# Patient Record
Sex: Female | Born: 1990 | Race: Black or African American | Hispanic: No | Marital: Single | Smoking: Never smoker
Health system: Southern US, Community
[De-identification: ages and names within clinical notes are randomized; demographics above are authoritative.]

## PROBLEM LIST (undated history)

## (undated) ENCOUNTER — Inpatient Hospital Stay (HOSPITAL_COMMUNITY): Payer: Self-pay

## (undated) DIAGNOSIS — K219 Gastro-esophageal reflux disease without esophagitis: Secondary | ICD-10-CM

## (undated) DIAGNOSIS — A749 Chlamydial infection, unspecified: Secondary | ICD-10-CM

## (undated) DIAGNOSIS — Z8619 Personal history of other infectious and parasitic diseases: Secondary | ICD-10-CM

## (undated) DIAGNOSIS — Z8659 Personal history of other mental and behavioral disorders: Secondary | ICD-10-CM

## (undated) DIAGNOSIS — F410 Panic disorder [episodic paroxysmal anxiety] without agoraphobia: Secondary | ICD-10-CM

## (undated) DIAGNOSIS — A549 Gonococcal infection, unspecified: Secondary | ICD-10-CM

## (undated) DIAGNOSIS — K8020 Calculus of gallbladder without cholecystitis without obstruction: Secondary | ICD-10-CM

## (undated) HISTORY — PX: CHOLECYSTECTOMY: SHX55

## (undated) HISTORY — PX: THERAPEUTIC ABORTION: SHX798

---
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
## 2011-02-05 ENCOUNTER — Emergency Department (HOSPITAL_COMMUNITY)
Admission: EM | Admit: 2011-02-05 | Discharge: 2011-02-05 | Disposition: A | Discharge status: Home / Self Care | Payer: Medicaid Other | Attending: Emergency Medicine | Admitting: Emergency Medicine

## 2011-02-05 DIAGNOSIS — R109 Unspecified abdominal pain: Secondary | ICD-10-CM | POA: Insufficient documentation

## 2011-02-05 DIAGNOSIS — N390 Urinary tract infection, site not specified: Secondary | ICD-10-CM | POA: Insufficient documentation

## 2011-02-05 DIAGNOSIS — R300 Dysuria: Secondary | ICD-10-CM | POA: Insufficient documentation

## 2011-02-05 LAB — URINALYSIS, ROUTINE W REFLEX MICROSCOPIC
Glucose, UA: NEGATIVE mg/dL
pH: 6 (ref 5.0–8.0)

## 2011-02-05 LAB — URINE MICROSCOPIC-ADD ON

## 2011-02-07 LAB — URINE CULTURE
Colony Count: 55000
Culture  Setup Time: 201209181354

## 2011-12-16 ENCOUNTER — Emergency Department (HOSPITAL_COMMUNITY)
Admission: EM | Admit: 2011-12-16 | Discharge: 2011-12-16 | Disposition: A | Discharge status: Home / Self Care | Payer: Medicaid Other | Attending: Emergency Medicine | Admitting: Emergency Medicine

## 2011-12-16 ENCOUNTER — Emergency Department (HOSPITAL_COMMUNITY): Payer: Medicaid Other

## 2011-12-16 ENCOUNTER — Encounter (HOSPITAL_COMMUNITY): Payer: Self-pay | Admitting: Emergency Medicine

## 2011-12-16 DIAGNOSIS — R197 Diarrhea, unspecified: Secondary | ICD-10-CM | POA: Insufficient documentation

## 2011-12-16 DIAGNOSIS — R109 Unspecified abdominal pain: Secondary | ICD-10-CM | POA: Insufficient documentation

## 2011-12-16 LAB — WET PREP, GENITAL
Clue Cells Wet Prep HPF POC: NONE SEEN
Yeast Wet Prep HPF POC: NONE SEEN

## 2011-12-16 LAB — URINALYSIS, ROUTINE W REFLEX MICROSCOPIC
Glucose, UA: NEGATIVE mg/dL
Leukocytes, UA: NEGATIVE
Nitrite: NEGATIVE
pH: 6.5 (ref 5.0–8.0)

## 2011-12-16 LAB — CBC WITH DIFFERENTIAL/PLATELET
Basophils Relative: 1 % (ref 0–1)
Eosinophils Relative: 1 % (ref 0–5)
HCT: 37.1 % (ref 36.0–46.0)
Hemoglobin: 12.6 g/dL (ref 12.0–15.0)
MCV: 84.7 fL (ref 78.0–100.0)
RBC: 4.38 MIL/uL (ref 3.87–5.11)
WBC: 5.4 10*3/uL (ref 4.0–10.5)

## 2011-12-16 LAB — COMPREHENSIVE METABOLIC PANEL
AST: 15 U/L (ref 0–37)
Alkaline Phosphatase: 63 U/L (ref 39–117)
BUN: 8 mg/dL (ref 6–23)
Calcium: 9.4 mg/dL (ref 8.4–10.5)
Chloride: 104 mEq/L (ref 96–112)
GFR calc non Af Amer: >90 mL/min (ref >90)
Glucose, Bld: 86 mg/dL (ref 70–99)
Total Protein: 8 g/dL (ref 6.0–8.3)

## 2011-12-16 LAB — POCT PREGNANCY, URINE: Preg Test, Ur: NEGATIVE

## 2011-12-16 LAB — URINE MICROSCOPIC-ADD ON

## 2011-12-16 MED ORDER — FAMOTIDINE 20 MG PO TABS: 20.0000 mg | ORAL_TABLET | Freq: Two times a day (BID) | ORAL | Status: DC

## 2011-12-16 NOTE — ED Notes (Signed)
Sharp abd pain  That radiates from lower abd to chest started 1 week ago denies n/v/d states pain makes it hard to breath sometimes. Pt states is on her period now denies dysuria or vag d/c

## 2011-12-16 NOTE — ED Provider Notes (Signed)
History     CSN: [id number]  Arrival date & time 12/16/11  4098   First MD Initiated Contact with Patient 12/16/11 [phone]      Chief Complaint  Patient presents with  . Abdominal Pain    (Consider location/radiation/quality/duration/timing/severity/associated sxs/prior treatment) HPI  21 y/o female c/o lower abdominal pain that radiates to sternum episodes last an hour have been going on x7 days. Denies N/V, urinary symptoms, fever. Pain only comes between 2-4 AM. She is in no pain now, pain not timed with PO intake.  Pain worsened significantly last night to8/10. Pt has not taken any pain Rx. Associated with SOB. +Diarrhea.   PCP Tamela Oddi  LMP: 12/13/2011 actively menstruating  History reviewed. No pertinent past medical history.  History reviewed. No pertinent past surgical history.  No family history on file.  History  Substance Use Topics  . Smoking status: Not on file  . Smokeless tobacco: Not on file  . Alcohol Use: Not on file    OB History    Grav Para Term Preterm Abortions TAB SAB Ect Mult Living                  Review of Systems  Constitutional: Negative for fever and unexpected weight change.  Eyes: Negative for visual disturbance.  Respiratory: Negative for cough and chest tightness.   Cardiovascular: Negative for chest pain, palpitations and leg swelling.  Gastrointestinal: Positive for diarrhea. Negative for nausea and abdominal pain.  Musculoskeletal: Negative for back pain.  Skin: Negative for rash.  Neurological: Negative for weakness, numbness and headaches.  Psychiatric/Behavioral: Negative for agitation.  All other systems reviewed and are negative.    Allergies  Review of patient's allergies indicates no known allergies.  Home Medications  No current outpatient prescriptions on file.  BP 126/87  Pulse 68  Temp 97.5 F (36.4 C) (Oral)  Resp 16  SpO2 100%  Physical Exam  Vitals reviewed. Constitutional: She is oriented to  person, place, and time. She appears well-developed and well-nourished. No distress.  HENT:  Head: Normocephalic.  Eyes: Conjunctivae and EOM are normal. Pupils are equal, round, and reactive to light.  Neck: Normal range of motion.  Cardiovascular: Normal rate, regular rhythm and normal heart sounds.   Pulmonary/Chest: Effort normal.  Abdominal: Soft. Bowel sounds are normal. She exhibits no distension and no mass. There is tenderness. There is no rebound and no guarding.       Tenderness to right upper quadrant right lower quadrant and suprapubic area. No rebound no brought guarding Rovsing negative, psoas negative, obturator positive  Genitourinary: Uterus normal. Pelvic exam was performed with patient prone. There is no rash, tenderness, lesion or injury on the right labia. There is no rash, tenderness, lesion or injury on the left labia. Uterus is not tender. Cervix exhibits discharge. Cervix exhibits no motion tenderness and no friability. Right adnexum displays no mass, no tenderness and no fullness. Left adnexum displays no mass, no tenderness and no fullness. There is bleeding around the vagina. No erythema or tenderness around the vagina. No foreign body around the vagina. No signs of injury around the vagina. No vaginal discharge found.       No cervical motion tenderness, scant dark blood from os and in the posterior fornix  Musculoskeletal: Normal range of motion.  Neurological: She is alert and oriented to person, place, and time.  Skin: Skin is warm and dry.  Psychiatric: She has a normal mood and affect.  ED Course  Procedures (including critical care time)  Labs Reviewed  LIPASE, BLOOD - Abnormal; Notable for the following:    Lipase 67 (*)     All other components within normal limits  URINALYSIS, ROUTINE W REFLEX MICROSCOPIC - Abnormal; Notable for the following:    Hgb urine dipstick LARGE (*)     All other components within normal limits  WET PREP, GENITAL -  Abnormal; Notable for the following:    WBC, Wet Prep HPF POC MODERATE (*)     All other components within normal limits  CBC WITH DIFFERENTIAL  COMPREHENSIVE METABOLIC PANEL  POCT PREGNANCY, URINE  URINE MICROSCOPIC-ADD ON  GC/CHLAMYDIA PROBE AMP, GENITAL   Dg Chest 2 View  12/16/2011  *RADIOLOGY REPORT*  Clinical Data: Chest pain.  Abdominal pain.  Shortness of breath.  CHEST - 2 VIEW  Comparison: None.  Findings: Heart size is normal.  Mediastinal shadows are normal. Lungs are clear.  No effusions.  No bony abnormalities.  IMPRESSION: Normal chest  Original Report Authenticated By: Thomasenia Sales, M.D.     1. Abdominal pain       MDM  21 year old female complaining of abdominal pain originating and suprapubic area radiating to the sternum was accompanied shortness of breath for one week. Pain occurs only at night between the hours of 2 and 5 AM. Pain occurs every night.  Chest x-ray and blood work are normal UA shows because she is actively menstruating at this time EKG shows nonspecific T wave inversions. Patient is low risk by Wells criteria and perked negative.   Date: 12/16/2011  Rate: 72  Rhythm: normal sinus rhythm  QRS Axis: normal  Intervals: normal  ST/T Wave abnormalities: nonspecific T wave changes  Conduction Disutrbances:none  Narrative Interpretation: Flipped T waves in III, AVF, and anterior leads  Old EKG Reviewed: none available  No acute cause of pain likely based on history, physical exam and labs. Because the pain comes at night GERD is most likely cause. I will treat with Pepcid and encourage primary care followup. Discussed case with attending who agrees with plan and stability to d/c to home.  Pt verbalized understanding and agrees with care plan. Outpatient follow-up and return precautions given.     Wynetta Emery, PA-C 12/16/11 1119

## 2011-12-16 NOTE — ED Provider Notes (Signed)
Medical screening examination/treatment/procedure(s) were performed by non-physician practitioner and as supervising physician I was immediately available for consultation/collaboration.   Sevastian Witczak, MD 12/16/11 1644 

## 2012-01-16 ENCOUNTER — Encounter (HOSPITAL_COMMUNITY): Payer: Self-pay | Admitting: Emergency Medicine

## 2012-01-16 ENCOUNTER — Emergency Department (HOSPITAL_COMMUNITY)
Admission: EM | Admit: 2012-01-16 | Discharge: 2012-01-16 | Disposition: A | Discharge status: Home / Self Care | Payer: Medicaid Other | Attending: Emergency Medicine | Admitting: Emergency Medicine

## 2012-01-16 ENCOUNTER — Emergency Department (HOSPITAL_COMMUNITY)
Admission: EM | Admit: 2012-01-16 | Discharge: 2012-01-16 | Disposition: A | Discharge status: Home / Self Care | Payer: Medicaid Other | Source: Home / Self Care | Attending: Emergency Medicine | Admitting: Emergency Medicine

## 2012-01-16 DIAGNOSIS — Z113 Encounter for screening for infections with a predominantly sexual mode of transmission: Secondary | ICD-10-CM | POA: Insufficient documentation

## 2012-01-16 DIAGNOSIS — B9689 Other specified bacterial agents as the cause of diseases classified elsewhere: Secondary | ICD-10-CM | POA: Insufficient documentation

## 2012-01-16 DIAGNOSIS — N760 Acute vaginitis: Secondary | ICD-10-CM

## 2012-01-16 DIAGNOSIS — A499 Bacterial infection, unspecified: Secondary | ICD-10-CM | POA: Insufficient documentation

## 2012-01-16 LAB — URINALYSIS, ROUTINE W REFLEX MICROSCOPIC
Bilirubin Urine: NEGATIVE
Glucose, UA: NEGATIVE mg/dL
Glucose, UA: NEGATIVE mg/dL
Hgb urine dipstick: NEGATIVE
Nitrite: NEGATIVE
Specific Gravity, Urine: 1.03 (ref 1.005–1.030)
pH: 7 (ref 5.0–8.0)
pH: 6 (ref 5.0–8.0)

## 2012-01-16 LAB — WET PREP, GENITAL: Yeast Wet Prep HPF POC: NONE SEEN

## 2012-01-16 LAB — URINE MICROSCOPIC-ADD ON

## 2012-01-16 MED ORDER — METRONIDAZOLE 500 MG PO TABS: 500.0000 mg | ORAL_TABLET | Freq: Three times a day (TID) | ORAL | Status: AC

## 2012-01-16 NOTE — ED Notes (Signed)
Vag d/c and hurts to void x 3 days

## 2012-01-16 NOTE — ED Provider Notes (Cosign Needed)
History  Scribed for Ward Givens, MD, the patient was seen in room TR04C/TR04C. This chart was scribed by Candelaria Stagers. The patient's care started at 7:00 PM   CSN: [id number]  Arrival date & time 01/16/12  1303   First MD Initiated Contact with Patient 01/16/12 1648      Chief Complaint  Patient presents with  . Exposure to STD     The history is provided by the patient.   Annette Wood is a 21 y.o. female who presents to the Emergency Department complaining of vaginal discharge and pain that started three days ago.  Pt has the implanon in place and states that she has irregular periods since she has the implanon with her last period being  three weeks ago.  She is also experiencing dysuria and increased frequency.  Pt denies vaginal bleeding, abdominal pain or hematuria.   Pt has h/o chlamydia and states that the present sx are different.  She denies new sexual partners.  Grava 2 Para 2 AB0, LNP the first of the month. G[facility]  History reviewed. No pertinent past medical history.  History reviewed. No pertinent past surgical history.  No family history on file.  History  Substance Use Topics  . Smoking status: Not on file  . Smokeless tobacco: Not on file  . Alcohol Use: Not on file  unemployed  OB History    Grav Para Term Preterm Abortions TAB SAB Ect Mult Living                  Review of Systems  Gastrointestinal: Negative for abdominal pain.  Genitourinary: Positive for dysuria and frequency. Negative for hematuria.  All other systems reviewed and are negative.    Allergies  Review of patient's allergies indicates no known allergies.  Home Medications   Current Outpatient Rx  Name Route Sig Dispense Refill  . FAMOTIDINE 20 MG PO TABS Oral Take 1 tablet (20 mg total) by mouth 2 (two) times daily. 30 tablet 0  Implanon  BP 119/86  Pulse 85  Temp 98.4 F (36.9 C) (Oral)  Resp 20  SpO2 98%  Vital signs normal    Physical Exam    Nursing note and vitals reviewed. Constitutional: She is oriented to person, place, and time. She appears well-developed and well-nourished. No distress.  HENT:  Head: Normocephalic and atraumatic.  Right Ear: External ear normal.  Left Ear: External ear normal.  Nose: Nose normal.  Mouth/Throat: Oropharynx is clear and moist.  Eyes: Conjunctivae and EOM are normal. Pupils are equal, round, and reactive to light.  Neck: Normal range of motion.  Cardiovascular: Normal rate, regular rhythm and normal heart sounds.   Pulmonary/Chest: Effort normal. No respiratory distress. She has no wheezes. She has no rales.  Abdominal: Soft. Bowel sounds are normal. She exhibits no distension. There is no tenderness. There is no rebound and no guarding.  Genitourinary:       A lot of thin white discharge.  Normal external genitalia.  Uterus normal size and non tender.  Adnexa normal not enlarged   Musculoskeletal: Normal range of motion.  Neurological: She is alert and oriented to person, place, and time.  Skin: Skin is warm and dry. She is not diaphoretic.  Psychiatric: She has a normal mood and affect. Her behavior is normal.    ED Course  Procedures   DIAGNOSTIC STUDIES: Oxygen Saturation is 98% on room air, normal by my interpretation.    COORDINATION OF CARE:  13:25 Pregnancy, urine POC; Urinalysis, Routine w reflex microscopic; Urine microscopic-add on   17:09 Ordered: GC/chlamydia probe amp, genital; RPR; Urinalysis, Routine w reflex microscopic; Wet prep, genital  Results for orders placed during the hospital encounter of 01/16/12  URINALYSIS, ROUTINE W REFLEX MICROSCOPIC      Component Value Range   Color, Urine YELLOW  YELLOW   APPearance CLEAR  CLEAR   Specific Gravity, Urine 1.030  1.005 - 1.030   pH 7.0  5.0 - 8.0   Glucose, UA NEGATIVE  NEGATIVE mg/dL   Hgb urine dipstick NEGATIVE  NEGATIVE   Bilirubin Urine NEGATIVE  NEGATIVE   Ketones, ur NEGATIVE  NEGATIVE mg/dL   Protein,  ur NEGATIVE  NEGATIVE mg/dL   Urobilinogen, UA 1.0  0.0 - 1.0 mg/dL   Nitrite NEGATIVE  NEGATIVE   Leukocytes, UA MODERATE (*) NEGATIVE  POCT PREGNANCY, URINE      Component Value Range   Preg Test, Ur NEGATIVE  NEGATIVE  URINE MICROSCOPIC-ADD ON      Component Value Range   Squamous Epithelial / LPF RARE  RARE   WBC, UA 11-20  <3 WBC/hpf   RBC / HPF 0-2  <3 RBC/hpf   Bacteria, UA RARE  RARE   Urine-Other MUCOUS PRESENT    URINALYSIS, ROUTINE W REFLEX MICROSCOPIC      Component Value Range   Color, Urine YELLOW  YELLOW   APPearance CLEAR  CLEAR   Specific Gravity, Urine 1.030  1.005 - 1.030   pH 6.0  5.0 - 8.0   Glucose, UA NEGATIVE  NEGATIVE mg/dL   Hgb urine dipstick NEGATIVE  NEGATIVE   Bilirubin Urine NEGATIVE  NEGATIVE   Ketones, ur NEGATIVE  NEGATIVE mg/dL   Protein, ur NEGATIVE  NEGATIVE mg/dL   Urobilinogen, UA 1.0  0.0 - 1.0 mg/dL   Nitrite NEGATIVE  NEGATIVE   Leukocytes, UA SMALL (*) NEGATIVE  WET PREP, GENITAL      Component Value Range   Yeast Wet Prep HPF POC NONE SEEN  NONE SEEN   Trich, Wet Prep NONE SEEN  NONE SEEN   Clue Cells Wet Prep HPF POC MODERATE (*) NONE SEEN   WBC, Wet Prep HPF POC MANY (*) NONE SEEN  POCT PREGNANCY, URINE      Component Value Range   Preg Test, Ur NEGATIVE  NEGATIVE  URINE MICROSCOPIC-ADD ON      Component Value Range   Squamous Epithelial / LPF RARE  RARE   WBC, UA 3-6  <3 WBC/hpf   Urine-Other MUCOUS PRESENT     Laboratory interpretation all normal except BV    1. BV (bacterial vaginosis)     Patient's Medications  New Prescriptions   METRONIDAZOLE (FLAGYL) 500 MG TABLET    Take 1 tablet (500 mg total) by mouth 3 (three) times daily.    Plan discharge  Devoria Albe, MD, FACEP   MDM   I personally performed the services described in this documentation, which was scribed in my presence. The recorded information has been reviewed and considered.  Devoria Albe, MD, Armando Gang        Ward Givens, MD 01/16/12 1901

## 2012-01-18 NOTE — ED Notes (Signed)
+  Gonorrhea. Chart sent to EDP office for review. [facility] attached. °

## 2012-01-19 NOTE — ED Notes (Addendum)
Chart returned from EDP office. Prescribed azithromycin 1 gram PO x 1 and cefixime 400 mg PO. Prescribed by Johnnette Gourd PA-C. Called patient and informed them of +result and new Rx. Wants Rx called to [facility] on [address].

## 2012-01-19 NOTE — ED Notes (Signed)
Rx called to [facility] on [address] by Norm Parcel PFM.

## 2012-02-08 ENCOUNTER — Emergency Department (HOSPITAL_COMMUNITY)
Admission: EM | Admit: 2012-02-08 | Discharge: 2012-02-08 | Disposition: A | Discharge status: Home / Self Care | Payer: Medicaid Other | Attending: Emergency Medicine | Admitting: Emergency Medicine

## 2012-02-08 ENCOUNTER — Encounter (HOSPITAL_COMMUNITY): Payer: Self-pay | Admitting: Family Medicine

## 2012-02-08 DIAGNOSIS — K219 Gastro-esophageal reflux disease without esophagitis: Secondary | ICD-10-CM | POA: Insufficient documentation

## 2012-02-08 HISTORY — DX: Gastro-esophageal reflux disease without esophagitis: K21.9

## 2012-02-08 MED ORDER — HYDROCODONE-ACETAMINOPHEN 5-500 MG PO TABS: 1.0000 | ORAL_TABLET | Freq: Four times a day (QID) | ORAL | Status: DC | PRN

## 2012-02-08 MED ORDER — FAMOTIDINE 20 MG PO TABS: 20.0000 mg | ORAL_TABLET | Freq: Two times a day (BID) | ORAL | Status: DC

## 2012-02-08 MED ORDER — GI COCKTAIL ~~LOC~~
30.0000 mL | Freq: Once | ORAL | Status: AC
  Administered 2012-02-08: 30 mL via ORAL
  Filled 2012-02-08: qty 30

## 2012-02-08 NOTE — ED Notes (Signed)
Patient states that she has reflux. Today ate baked pork chops and rice and started having epigastric pain. Took Famotidine 20 mg (3 tabs, 1 @ 1900, 1 @ 2200, 1 @ 0100) without relief. Denies nausea, vomiting, diarrhea.

## 2012-02-08 NOTE — ED Notes (Signed)
Pt c/o epigastric burning radiating up into chest onset last even after eating. Pt denies n/v/d. PWD.

## 2012-02-08 NOTE — ED Provider Notes (Signed)
History     CSN: [id number]  Arrival date & time 02/08/12  1478   First MD Initiated Contact with Patient 02/08/12 0413      Chief Complaint  Patient presents with  . Abdominal Pain    (Consider location/radiation/quality/duration/timing/severity/associated sxs/prior treatment) HPI  Patient presents to the ER with complaints of reflux that will not get better with his pepcid. She had fried pork chops tonight with rice. She admits that she knows she should not have been met tonight as it aggravates her gastric reflux. She says that by the time she was being brought back to the exam room for acid reflux had gone away and she was no longer having any discomfort. She denies having any nausea, vomiting, diarrhea, chest pain, shortness of breath, wheezing, coughing or recent illness. She has not been to a specialist for this visit she has been dealing with this since her 21-year-old was born. VSS/NAD  Past Medical History  Diagnosis Date  . Gastroesophageal reflux     History reviewed. No pertinent past surgical history.  No family history on file.  History  Substance Use Topics  . Smoking status: Never Smoker   . Smokeless tobacco: Not on file  . Alcohol Use: No    OB History    Grav Para Term Preterm Abortions TAB SAB Ect Mult Living                  Review of Systems  Review of Systems  Gen: no weight loss, fevers, chills, night sweats  Eyes: no discharge or drainage, no occular pain or visual changes  Nose: no epistaxis or rhinorrhea  Mouth: no dental pain, no sore throat  Neck: no neck pain  Lungs:No wheezing, coughing or hemoptysis CV: no chest pain, palpitations, dependent edema or orthopnea  Abd: + epigastric pain and burping, nausea, vomiting  GU: no dysuria or gross hematuria  MSK:  No abnormalities  Neuro: no headache, no focal neurologic deficits  Skin: no abnormalities Psyche: negative.   Allergies  Review of patient's allergies indicates no known  allergies.  Home Medications   Current Outpatient Rx  Name Route Sig Dispense Refill  . ETONOGESTREL 68 MG [place] IMPL Subcutaneous Inject 1 each into the skin once.    Marland Kitchen FAMOTIDINE 20 MG PO TABS Oral Take 1 tablet (20 mg total) by mouth 2 (two) times daily. 30 tablet 0  . HYDROCODONE-ACETAMINOPHEN 5-500 MG PO TABS Oral Take 1-2 tablets by mouth every 6 (six) hours as needed for pain. 15 tablet 0    BP 130/91  Pulse 67  Temp 97.8 F (36.6 C) (Oral)  Resp 22  Wt 186 lb 4 oz (84.482 kg)  SpO2 100%  LMP 01/23/2012  Physical Exam  Nursing note and vitals reviewed. Constitutional: She appears well-developed and well-nourished. No distress.  HENT:  Head: Normocephalic and atraumatic.  Eyes: Pupils are equal, round, and reactive to light.  Neck: Normal range of motion. Neck supple.  Cardiovascular: Normal rate and regular rhythm.   Pulmonary/Chest: Effort normal.  Abdominal: Soft.  Neurological: She is alert.  Skin: Skin is warm and dry.    ED Course  Procedures (including critical care time)  Labs Reviewed - No data to display No results found.   1. GERD (gastroesophageal reflux disease)       MDM  Pt admits that by the time they started bringing her back to an exam room she was no longer having pain. She was given GI cocktail  and it also helped make her " feel better". Pt out of pepcid. This was refilled along with some pain medication and a referral to GI.  Pt has been advised of the symptoms that warrant their return to the ED. Patient has voiced understanding and has agreed to follow-up with the PCP or specialist.         Dorthula Matas, PA 02/08/12 [phone]

## 2012-02-09 NOTE — ED Provider Notes (Signed)
Medical screening examination/treatment/procedure(s) were performed by non-physician practitioner and as supervising physician I was immediately available for consultation/collaboration.  Sunnie Nielsen, MD 02/09/12 [phone]

## 2012-03-03 ENCOUNTER — Emergency Department (HOSPITAL_COMMUNITY)
Admission: EM | Admit: 2012-03-03 | Discharge: 2012-03-03 | Disposition: A | Discharge status: Home / Self Care | Payer: Medicaid Other | Attending: Emergency Medicine | Admitting: Emergency Medicine

## 2012-03-03 ENCOUNTER — Encounter (HOSPITAL_COMMUNITY): Payer: Self-pay

## 2012-03-03 DIAGNOSIS — T148 Other injury of unspecified body region: Secondary | ICD-10-CM | POA: Insufficient documentation

## 2012-03-03 DIAGNOSIS — W57XXXA Bitten or stung by nonvenomous insect and other nonvenomous arthropods, initial encounter: Secondary | ICD-10-CM | POA: Insufficient documentation

## 2012-03-03 DIAGNOSIS — K219 Gastro-esophageal reflux disease without esophagitis: Secondary | ICD-10-CM | POA: Insufficient documentation

## 2012-03-03 MED ORDER — PERMETHRIN 5 % EX CREA: TOPICAL_CREAM | CUTANEOUS | Status: DC

## 2012-03-03 NOTE — ED Notes (Signed)
[id number]<AV> Expected date:<BR> Expected time:<BR> Means of arrival:<BR> Comments:<BR> Hold for cleaning

## 2012-03-03 NOTE — ED Notes (Signed)
Pt presents with NAD- known exposure to bed bugs generalized x 2 days

## 2012-03-03 NOTE — ED Notes (Signed)
MD at bedside. 

## 2012-03-03 NOTE — ED Provider Notes (Signed)
History     CSN: [id number]  Arrival date & time 03/03/12  1032   First MD Initiated Contact with Patient 03/03/12 1106      Chief Complaint  Patient presents with  . Rash    bed bugs    (Consider location/radiation/quality/duration/timing/severity/associated sxs/prior treatment) HPI  Pt came in to ER for a rash. She got a new mattress from family member and saw small red bugs crawling around on it two days ago. Now she has a rash to legs, arms and some bite marks on the forehead that are very itchy. She got rid of the mattress already and her mother has washed all of her clothes and sheets that were in the room. No fevers, chills, nausea, vomiting, diarrhea or other systemic symptoms.  Past Medical History  Diagnosis Date  . Gastroesophageal reflux     History reviewed. No pertinent past surgical history.  No family history on file.  History  Substance Use Topics  . Smoking status: Never Smoker   . Smokeless tobacco: Not on file  . Alcohol Use: No    OB History    Grav Para Term Preterm Abortions TAB SAB Ect Mult Living                  Review of Systems   Review of Systems  Gen: no weight loss, fevers, chills, night sweats  Eyes: no discharge or drainage, no occular pain or visual changes  Nose: no epistaxis or rhinorrhea  Mouth: no dental pain, no sore throat  Neck: no neck pain  Lungs:No wheezing, coughing or hemoptysis CV: no chest pain, palpitations, dependent edema or orthopnea  Abd: no abdominal pain, nausea, vomiting  GU: no dysuria or gross hematuria  MSK:  No abnormalities  Neuro: no headache, no focal neurologic deficits  Skin: bug bites Psyche: negative.    Allergies  Review of patient's allergies indicates no known allergies.  Home Medications   Current Outpatient Rx  Name Route Sig Dispense Refill  . FAMOTIDINE 20 MG PO TABS Oral Take 1 tablet (20 mg total) by mouth 2 (two) times daily. 30 tablet 0  . ETONOGESTREL 68 MG [place] IMPL  Subcutaneous Inject 1 each into the skin once.    Marland Kitchen PERMETHRIN 5 % EX CREA  Apply to affected area once 60 g 0    BP 116/81  Pulse 81  Temp 98.5 F (36.9 C) (Oral)  Resp 18  Ht 5\' 3"  (1.6 m)  Wt 182 lb (82.555 kg)  BMI 32.24 kg/m2  SpO2 100%  LMP 01/23/2012  Physical Exam  Nursing note and vitals reviewed. Constitutional: She appears well-developed and well-nourished. No distress.  HENT:  Head: Normocephalic and atraumatic.  Eyes: Pupils are equal, round, and reactive to light.  Neck: Normal range of motion. Neck supple.  Cardiovascular: Normal rate and regular rhythm.   Pulmonary/Chest: Effort normal.  Abdominal: Soft.  Neurological: She is alert.  Skin: Skin is warm and dry. Rash noted.       But bites to arms and legs    ED Course  Procedures (including critical care time)  Labs Reviewed - No data to display No results found.   1. Bed bug bite       MDM  Consistent with Bed bugs. Pt education given. Referral to Derm and Elimite cream Rx.  Pt has been advised of the symptoms that warrant their return to the ED. Patient has voiced understanding and has agreed to follow-up with the  PCP or specialist.         Dorthula Matas, PA 03/03/12 [phone]

## 2012-03-03 NOTE — ED Provider Notes (Signed)
Medical screening examination/treatment/procedure(s) were performed by non-physician practitioner and as supervising physician I was immediately available for consultation/collaboration.   Charles B. Sheldon, MD 03/03/12 1511 

## 2012-05-20 NOTE — L&D Delivery Note (Signed)
Delivery Note At 3:25 PM a viable female was delivered via Vaginal, Spontaneous Delivery (Presentation: ; Occiput Anterior).  APGAR: 9, 9; weight:  3221 gms .   Placenta status: Intact, Spontaneous.  Cord: 3 vessels with the following complications: None.  Cord pH: none  Anesthesia: Epidural  Episiotomy: None Lacerations: None Suture Repair: none Est. Blood Loss (mL): 350  Mom to postpartum.  Baby to Couplet care / Skin to Skin.  Annette Wood A 04/01/2013, 3:56 PM

## 2012-08-01 ENCOUNTER — Inpatient Hospital Stay (HOSPITAL_COMMUNITY): Payer: Medicaid Other

## 2012-08-01 ENCOUNTER — Inpatient Hospital Stay (HOSPITAL_COMMUNITY)
Admission: AD | Admit: 2012-08-01 | Discharge: 2012-08-01 | Disposition: A | Discharge status: Home / Self Care | Payer: Medicaid Other | Source: Ambulatory Visit | Attending: Family Medicine | Admitting: Family Medicine

## 2012-08-01 ENCOUNTER — Encounter (HOSPITAL_COMMUNITY): Payer: Self-pay | Admitting: Family

## 2012-08-01 DIAGNOSIS — O2390 Unspecified genitourinary tract infection in pregnancy, unspecified trimester: Secondary | ICD-10-CM | POA: Insufficient documentation

## 2012-08-01 DIAGNOSIS — O210 Mild hyperemesis gravidarum: Secondary | ICD-10-CM | POA: Insufficient documentation

## 2012-08-01 DIAGNOSIS — A499 Bacterial infection, unspecified: Secondary | ICD-10-CM | POA: Insufficient documentation

## 2012-08-01 DIAGNOSIS — O219 Vomiting of pregnancy, unspecified: Secondary | ICD-10-CM

## 2012-08-01 DIAGNOSIS — N760 Acute vaginitis: Secondary | ICD-10-CM | POA: Insufficient documentation

## 2012-08-01 DIAGNOSIS — B9689 Other specified bacterial agents as the cause of diseases classified elsewhere: Secondary | ICD-10-CM | POA: Insufficient documentation

## 2012-08-01 DIAGNOSIS — O9989 Other specified diseases and conditions complicating pregnancy, childbirth and the puerperium: Secondary | ICD-10-CM

## 2012-08-01 DIAGNOSIS — R109 Unspecified abdominal pain: Secondary | ICD-10-CM | POA: Insufficient documentation

## 2012-08-01 HISTORY — DX: Gonococcal infection, unspecified: A54.9

## 2012-08-01 HISTORY — DX: Chlamydial infection, unspecified: A74.9

## 2012-08-01 HISTORY — DX: Panic disorder (episodic paroxysmal anxiety): F41.0

## 2012-08-01 LAB — CBC
HCT: 33.9 % — ABNORMAL LOW (ref 36.0–46.0)
Hemoglobin: 11.9 g/dL — ABNORMAL LOW (ref 12.0–15.0)
MCV: 85 fL (ref 78.0–100.0)
RBC: 3.99 MIL/uL (ref 3.87–5.11)
WBC: 4.8 10*3/uL (ref 4.0–10.5)

## 2012-08-01 LAB — URINALYSIS, ROUTINE W REFLEX MICROSCOPIC
Bilirubin Urine: NEGATIVE
Glucose, UA: NEGATIVE mg/dL
Hgb urine dipstick: NEGATIVE
Specific Gravity, Urine: >1.03 — ABNORMAL HIGH (ref 1.005–1.030)
pH: 6 (ref 5.0–8.0)

## 2012-08-01 LAB — WET PREP, GENITAL: Trich, Wet Prep: NONE SEEN

## 2012-08-01 LAB — HCG, QUANTITATIVE, PREGNANCY: hCG, Beta Chain, Quant, S: 2305 m[IU]/mL — ABNORMAL HIGH (ref <5)

## 2012-08-01 MED ORDER — METRONIDAZOLE 500 MG PO TABS: 500.0000 mg | ORAL_TABLET | Freq: Two times a day (BID) | ORAL | Status: DC

## 2012-08-01 MED ORDER — DEXTROSE 5 % IN LACTATED RINGERS IV BOLUS: 1000.0000 mL | Freq: Once | INTRAVENOUS | Status: DC

## 2012-08-01 MED ORDER — ONDANSETRON HCL 4 MG/2ML IJ SOLN
4.0000 mg | Freq: Once | INTRAMUSCULAR | Status: DC
  Filled 2012-08-01: qty 2

## 2012-08-01 NOTE — MAU Provider Note (Signed)
History     CSN: [id number]  Arrival date and time: 08/01/12 1217   None     Chief Complaint  Patient presents with  . Possible Pregnancy   HPI 22 y.o. G3P2002 at 4.3 weeks with low abd pain x 1 month, crampy, no bleeding. Mild nausea and vomiting, hasn't eaten today d/t lack of appetite.    Past Medical History  Diagnosis Date  . Gastroesophageal reflux   . Chlamydia   . Gonorrhea   . Panic attack     Past Surgical History  Procedure Laterality Date  . No past surgeries      History reviewed. No pertinent family history.  History  Substance Use Topics  . Smoking status: Never Smoker   . Smokeless tobacco: Never Used  . Alcohol Use: No    Allergies: No Known Allergies  No prescriptions prior to admission    Review of Systems  Constitutional: Negative.   Respiratory: Negative.   Cardiovascular: Negative.   Gastrointestinal: Positive for nausea, vomiting and abdominal pain. Negative for diarrhea and constipation.  Genitourinary: Negative for dysuria, urgency, frequency, hematuria and flank pain.       Negative for vaginal bleeding, vaginal discharge, dyspareunia  Musculoskeletal: Negative.   Neurological: Negative.   Psychiatric/Behavioral: Negative.    Physical Exam   Blood pressure 126/86, pulse 88, temperature 97.5 F (36.4 C), temperature source Oral, resp. rate 18, height 5\' 3"  (1.6 m), weight 175 lb (79.379 kg), last menstrual period 07/01/2012.  Physical Exam  Nursing note and vitals reviewed. Constitutional: She is oriented to person, place, and time. She appears well-developed and well-nourished. No distress.  Cardiovascular: Normal rate.   Respiratory: Effort normal.  GI: Soft. There is no tenderness.  Genitourinary: There is no tenderness or lesion on the right labia. There is no tenderness or lesion on the left labia. Uterus is not enlarged and not tender. Cervix exhibits no motion tenderness, no discharge and no friability. Right adnexum  displays no mass, no tenderness and no fullness. Left adnexum displays no mass, no tenderness and no fullness. No erythema or bleeding around the vagina. Vaginal discharge (foamy, white, malodorous) found.  Musculoskeletal: Normal range of motion.  Neurological: She is alert and oriented to person, place, and time.  Skin: Skin is warm and dry.  Psychiatric: She has a normal mood and affect.    MAU Course  Procedures Results for orders placed during the hospital encounter of 08/01/12 (from the past 24 hour(s))  URINALYSIS, ROUTINE W REFLEX MICROSCOPIC     Status: Abnormal   Collection Time    08/01/12 12:27 PM      Result Value Range   Color, Urine YELLOW  YELLOW   APPearance CLEAR  CLEAR   Specific Gravity, Urine >1.030 (*) 1.005 - 1.030   pH 6.0  5.0 - 8.0   Glucose, UA NEGATIVE  NEGATIVE mg/dL   Hgb urine dipstick NEGATIVE  NEGATIVE   Bilirubin Urine NEGATIVE  NEGATIVE   Ketones, ur 40 (*) NEGATIVE mg/dL   Protein, ur NEGATIVE  NEGATIVE mg/dL   Urobilinogen, UA 1.0  0.0 - 1.0 mg/dL   Nitrite NEGATIVE  NEGATIVE   Leukocytes, UA NEGATIVE  NEGATIVE  POCT PREGNANCY, URINE     Status: Abnormal   Collection Time    08/01/12 12:31 PM      Result Value Range   Preg Test, Ur POSITIVE (*) NEGATIVE  HCG, QUANTITATIVE, PREGNANCY     Status: Abnormal   Collection Time  08/01/12 12:53 PM      Result Value Range   hCG, Beta Chain, Quant, S 2305 (*) <5 mIU/mL  ABO/RH     Status: None   Collection Time    08/01/12 12:53 PM      Result Value Range   ABO/RH(D) B POS    CBC     Status: Abnormal   Collection Time    08/01/12 12:54 PM      Result Value Range   WBC 4.8  4.0 - 10.5 K/uL   RBC 3.99  3.87 - 5.11 MIL/uL   Hemoglobin 11.9 (*) 12.0 - 15.0 g/dL   HCT 52.8 (*) 41.3 - 24.4 %   MCV 85.0  78.0 - 100.0 fL   MCH 29.8  26.0 - 34.0 pg   MCHC 35.1  30.0 - 36.0 g/dL   RDW 01.0  27.2 - 53.6 %   Platelets 299  150 - 400 K/uL  WET PREP, GENITAL     Status: Abnormal   Collection Time     08/01/12  1:36 PM      Result Value Range   Yeast Wet Prep HPF POC NONE SEEN  NONE SEEN   Trich, Wet Prep NONE SEEN  NONE SEEN   Clue Cells Wet Prep HPF POC MODERATE (*) NONE SEEN   WBC, Wet Prep HPF POC FEW (*) NONE SEEN   Attempted to start IV for hydration, RN had difficulty with IV start. Pt states she can tolerate PO, just dehydrated because she hasn't had anything to eat or drink today - not having n/v now. Will PO hydrate.    U/S: Probable 5 week size gestational sac, no yolk sac or fetal pole seen, no adnexal mass  Assessment and Plan   1. Abdominal pain in pregnancy, antepartum   2. BV (bacterial vaginosis)   3. Nausea and vomiting in pregnancy prior to [redacted] weeks gestation   F/U in 48 hours for repeat quant HCG, precautions rev'd    Medication List    TAKE these medications       metroNIDAZOLE 500 MG tablet  Commonly known as:  FLAGYL  Take 1 tablet (500 mg total) by mouth 2 (two) times daily.            Follow-up Information   Follow up with THE [facility] [place] MATERNITY ADMISSIONS On 08/03/2012. (for repeat labs)    Contact information:   [address] [id number] [place] [address] [phone]       [facility] 08/01/2012, 3:45 PM

## 2012-08-01 NOTE — MAU Note (Signed)
Patient presents to MAU after positive home pregnancy test; reports LMP 2/12 and intermittent lower abdominal cramping x one month.  Denies vaginal bleeding.

## 2012-08-02 NOTE — MAU Provider Note (Signed)
Chart reviewed and agree with management and plan.  

## 2012-08-05 LAB — GC/CHLAMYDIA PROBE AMP: CT Probe RNA: NEGATIVE

## 2012-08-24 ENCOUNTER — Encounter: Payer: Self-pay | Admitting: Obstetrics & Gynecology

## 2012-08-24 ENCOUNTER — Ambulatory Visit (INDEPENDENT_AMBULATORY_CARE_PROVIDER_SITE_OTHER): Payer: Medicaid Other | Admitting: Obstetrics & Gynecology

## 2012-08-24 VITALS — BP 116/80 | Temp 98.1°F | Wt 174.0 lb

## 2012-08-24 DIAGNOSIS — Z3480 Encounter for supervision of other normal pregnancy, unspecified trimester: Secondary | ICD-10-CM

## 2012-08-24 DIAGNOSIS — Z3481 Encounter for supervision of other normal pregnancy, first trimester: Secondary | ICD-10-CM

## 2012-08-24 LAB — POCT URINALYSIS DIPSTICK
Bilirubin, UA: NEGATIVE
Blood, UA: NEGATIVE
Ketones, UA: NEGATIVE
Protein, UA: NEGATIVE
pH, UA: 6

## 2012-08-24 MED ORDER — PRENATAL MULTIVITAMIN CH: 1.0000 | ORAL_TABLET | Freq: Every day | ORAL | Status: DC

## 2012-08-24 NOTE — Patient Instructions (Addendum)
Patient information: Should I have a screening test for Down syndrome during pregnancy? (Beyond the Basics)  Authors Jacquelyn V Halliday, MS Geralyn M Messerlian, PhD Glenn E Palomaki, PhD Section Editor Louise Wilkins-Haug, MD, PhD Deputy Editor Vanessa A Barss, MD Disclosures  All topics are updated as new evidence becomes available and our peer review process is complete.  Literature review current through: Feb 2014.  This topic last updated: Mar 24, 2012.  INTRODUCTION - This topic provides information about prenatal screening for Down syndrome to help you decide if you want to undergo this test. More detailed information about Down syndrome screening tests is available by subscription. (See "Down syndrome: Prenatal screening overview".) WHAT IS DOWN SYNDROME? - The first decision you need to make is if you want to know, before it is born, whether your developing baby has Down syndrome. It may help to review some facts about the condition. More detailed information is available separately. (See "Patient information: Down syndrome (Beyond the Basics)".) ?Down syndrome is caused by an extra number 21 chromosome ?It occurs in about 1 in 700 births and is more common earlier in pregnancy ?People with Down syndrome have mild to moderate intellectual disability (mental retardation), meaning that the person can often do things independently; however, most need supervision throughout their lives. ?People with Down syndrome have characteristic facial features (picture 1), meaning that their facial features are similar to those of other people with Down syndrome ?People with Down syndrome may have birth defects, such as problems with how the heart or intestines develop. Other medical problems can also develop. ?The average lifespan for an individual with Down syndrome is about 50 to 60 years Could my baby have Down syndrome? ?A woman of any age can have a baby with Down syndrome, but the chance gets  higher as a woman gets older. ?Down syndrome usually does not run in families, except in rare cases. You should inform your doctor or nurse if you or your partner has a family member with Down syndrome. WHAT INFORMATION DOES A PRENATAL SCREENING TEST FOR DOWN SYNDROME PROVIDE? - A screening test will tell you the chances of having a certain medical condition. Screening tests for Down syndrome cannot tell for certain whether your baby actually has Down syndrome; rather, they tell you whether there is a low or high risk that the baby is affected. By comparison, a diagnostic test can tell for certain if the baby has Down syndrome. The advantage of Down syndrome screening tests is that they only require a blood test from the mother, and possibly an ultrasound, so there is no risk to the pregnancy. The diagnostic tests for Down syndrome require putting a needle into the uterus or placenta and removing some fluid or tissue. There is a small risk of miscarriage (about 1/200 for chorionic villus sampling [CVS] and 1/300 to 1/600 for amniocentesis) after a diagnostic test. The decision to have a prenatal screening test for Down syndrome is yours and depends upon your wishes, values, and beliefs. There is no right or wrong choice; you decide what is best for you and your family. Some couples who have a positive test decide against having a diagnostic test and some decide to continue the pregnancy even when Down syndrome is diagnosed. WHO IS OFFERED A SCREENING TEST FOR DOWN SYNDROME? - The American College of Obstetricians and Gynecologists recommends that all pregnant women, regardless of age, be offered the opportunity to have a screening test for Down syndrome before 20 weeks   of pregnancy. Screening tests for Down syndrome are voluntary, meaning that it is your choice whether to have or not have these tests. DECIDING TO HAVE A SCREENING TEST FOR DOWN SYNDROME Why should I have a screening test? - These are some of  the reasons that women choose to have screening for Down syndrome: ?I want as much information as possible during pregnancy about the health of my developing baby. ?If my baby has Down syndrome, I want to know while I am pregnant so I can learn as much as possible about the condition before the baby is born. ?I am planning to deliver my baby in a community hospital, so if my baby has serious birth defects associated with Down syndrome (eg, heart or intestinal abnormalities), I would rather deliver at a hospital with a special care nursery. ?I have been anxious since I learned I was pregnant and if I find out that my baby's risk of having Down syndrome is low, I believe it will help ease my anxiety. ?I want to consider all of my options. If my developing baby has Down syndrome, I would want the option to terminate the pregnancy. ?I am not sure what I would do, or how I would feel, if my baby has Down syndrome. I am going to take it one step at a time. If my screening test comes back saying I am at increased risk, I will decide at that time if I want to have more testing. Why might I choose not to have a screening test? - These are some of the reasons that a woman might choose NOT to have screening: ?I have decided that "whatever will be, will be," and I will wait until the baby's birth to find out if the baby is healthy. ?I do not want to be faced with decisions about my unborn baby. Because of religious or personal beliefs, I would never consider terminating an affected pregnancy. ?Since I know I would never have a diagnostic test, even with only a small risk of a miscarriage, I do not want to have a screening test. ?I want to know for sure if the developing baby has Down syndrome, so I am having a diagnostic test (eg, chorionic villus sampling [CVS] or amniocentesis) rather than a screening test. (See "Patient information: Chorionic villus sampling (Beyond the Basics)" and "Patient information:  Amniocentesis (Beyond the Basics)".) Some common myths about screening for Down syndrome - Some of the reasons women decide whether or not to have screening are based on incorrect information, such as: ?Myth - My baby won't have Down syndrome because I am young, I exercise, and I am healthy. ?Fact - A woman of any age can have a baby with Down syndrome, regardless of her health. ?Myth - My baby won't have Down syndrome because I do not drink or smoke. ?Fact - Avoiding alcohol or tobacco during pregnancy is very important for the health of you and your baby; however, it does not affect the chance that your baby will have Down syndrome. ?Myth - My baby won't have Down syndrome because no one in my family or the father of the baby's family has Down syndrome. ?Fact - Down syndrome usually does not run in families. Your baby can be affected even if there is no one else in the family with Down syndrome. If you have a family history of Down syndrome, you should talk to your doctor, nurse, or a genetic counselor to discuss if it will increase your   risk of having a baby with Down syndrome. ?Myth - I should not have screening for Down syndrome unless I know that I would terminate the pregnancy if Down syndrome were detected. ?Fact - Many people who would not terminate their pregnancy choose to have screening. These people want information about their unborn baby's health before birth to plan for delivery and newborn care. ?Myth - My friend told me that if I have a screening test, it will come back "positive" since most people who have the test end up with a "positive" result. ?Fact - Most people who have a screening test will have a "negative" result, meaning that the baby has a low risk of having Down syndrome. WHAT DOWN SYNDROME SCREENING TESTS ARE AVAILABLE? - There are several different screening tests available. Some important considerations include the following: ?How far along in pregnancy are you? ?What  screening tests are available in your area? ?What, if any, diagnostic tests (chorionic villus sampling [CVS] or amniocentesis) are available in your area? There are four basic types of screening tests for Down syndrome. Some of these tests need to be done early in the pregnancy, while one is not done until 15 to 18 weeks of pregnancy (at around 4 months). ?First-trimester screening is typically done at 11 to 13 weeks of pregnancy. It involves a test of your blood and an ultrasound of the developing baby. ?Second-trimester screening is typically done at 15 to 18 weeks of pregnancy. The test only requires a sample of your blood. ?Integrated screening combines results from tests done during the first and second trimesters. These tests involve two samples of your blood, and often include an ultrasound of the developing baby. Results are usually available in the second trimester. In some variations of this test, called sequential screening or contingent screening, results are available earlier if you are at very high or very low risk of having a baby with Down syndrome. ?A newest screening method is the measurement of circulating cell free DNA in maternal plasma, which can be done beginning at 10 weeks of gestation. This test only requires a sample of your blood. It is currently recommended for women at high risk of having a Down syndrome pregnancy and can markedly reduce the need for invasive diagnostic testing (amniocentesis, CVS). It may not be paid for by your health insurance. Which screening test should I choose? - The "best" screening test depends upon your values and preferences. You can use the following statements to help guide your decision (table 1). TEST RESULTS - Your risk of having a baby with Down syndrome is based on your age and the results of the blood test and ultrasound measurement. It takes about one week to get results. For most tests, the results will be given as a number. For example, a  woman with a result of 1 in 2000 would have a "low" risk that the baby is affected. A woman with a result of 1 in 50 would be considered at "high" risk. There is no screening result that will tell for sure if the developing baby definitely does or does not have Down syndrome. Screen positive results - If your test shows a "high" risk of having a baby with Down syndrome, you can choose to have: ?Further (diagnostic) testing, if you want to know for sure if your baby is affected. It will take about 2 to 3 weeks to schedule, perform, and get the results. ?No further testing during pregnancy. If needed, the infant can   be tested after birth. To help you with your decision, consider meeting with a genetic counselor. He or she can help you balance the risks and benefits of diagnostic testing. Talking with a counselor can also help you think about the issues involved in ending a pregnancy or raising a child with Down syndrome. Two diagnostic tests are available: ?Chorionic villus sampling (CVS) - CVS is the test that would be done if you were in the first trimester of pregnancy. The test is performed between 10 and 13 weeks of pregnancy and has a small risk of miscarriage (about 1 miscarriage for every 100 procedures). (See "Patient information: Chorionic villus sampling (Beyond the Basics)".) ?Amniocentesis - Amniocentesis is the test that would be done if you were in your second trimester (after 14 weeks of pregnancy). Amniocentesis is thought to have a smaller risk of miscarriage (less than 1 miscarriage for every 200 procedures) compared with CVS. (See "Patient information: Amniocentesis (Beyond the Basics)".) WHERE TO GET MORE INFORMATION - Your healthcare provider is the best source of information for questions and concerns related to your medical problem. This article will be updated as needed on our web site ([email or web address]/patients). Related topics for patients, as well as selected articles written for  healthcare professionals, are also available. Some of the most relevant are listed below. Patient level information - UpToDate offers two types of patient education materials. The Basics - The Basics patient education pieces answer the four or five key questions a patient might have about a given condition. These articles are best for patients who want a general overview and who prefer short, easy-to-read materials.  

## 2012-08-24 NOTE — Progress Notes (Signed)
.   Subjective:    Annette Wood is being seen today for her first obstetrical visit.  This is not a planned pregnancy. She is at [redacted]w[redacted]d gestation.  Relationship with FOB: significant other, not living together. Patient does intend to breast feed. Pregnancy history fully reviewed.  Menstrual History: OB History   Grav Para Term Preterm Abortions TAB SAB Ect Mult Living   3 2 2       2       Menarche age: 22  Patient's last menstrual period was 07/01/2012.    The following portions of the patient's history were reviewed and updated as appropriate: allergies, current medications, past family history, past medical history, past social history, past surgical history and problem list.  Review of Systems Pertinent items are noted in HPI.    Objective:    General appearance: alert Abdomen: soft, non-tender; bowel sounds normal; no masses,  no organomegaly Pelvic: cervix normal in appearance, external genitalia normal, no adnexal masses or tenderness and vagina normal without discharge Extremities: extremities normal, atraumatic, no cyanosis or edema    Assessment:   Multipara @ [redacted]w[redacted]d   Plan:    Initial labs drawn. Prenatal vitamins. Problem list reviewed and updated. Follow up in 6 weeks. 50% of 20 min visit spent on counseling and coordination of care.

## 2012-08-25 ENCOUNTER — Encounter: Payer: Self-pay | Admitting: Obstetrics & Gynecology

## 2012-08-25 LAB — OBSTETRIC PANEL
Eosinophils Absolute: 0.1 10*3/uL (ref 0.0–0.7)
Eosinophils Relative: 1 % (ref 0–5)
HCT: 36.4 % (ref 36.0–46.0)
Hemoglobin: 12.2 g/dL (ref 12.0–15.0)
Lymphocytes Relative: 28 % (ref 12–46)
Lymphs Abs: 1.8 10*3/uL (ref 0.7–4.0)
MCH: 28.4 pg (ref 26.0–34.0)
MCV: 84.8 fL (ref 78.0–100.0)
Monocytes Absolute: 0.6 10*3/uL (ref 0.1–1.0)
Monocytes Relative: 10 % (ref 3–12)
Platelets: 318 10*3/uL (ref 150–400)
RBC: 4.29 MIL/uL (ref 3.87–5.11)
Rh Type: POSITIVE
Rubella: 1.2 Index — ABNORMAL HIGH (ref <0.90)
WBC: 6.2 10*3/uL (ref 4.0–10.5)

## 2012-08-25 LAB — VARICELLA ZOSTER ANTIBODY, IGG: Varicella IgG: 304.6 Index — ABNORMAL HIGH (ref <135.00)

## 2012-08-25 LAB — CULTURE, OB URINE: Colony Count: 15000

## 2012-08-25 LAB — HIV ANTIBODY (ROUTINE TESTING W REFLEX): HIV: NONREACTIVE

## 2012-08-25 NOTE — Progress Notes (Signed)
CRL today [redacted]w[redacted]d; cardiac activity.

## 2012-08-26 LAB — HEMOGLOBINOPATHY EVALUATION: Hgb S Quant: 0 %

## 2012-08-27 ENCOUNTER — Encounter: Payer: Self-pay | Admitting: Obstetrics & Gynecology

## 2012-08-27 DIAGNOSIS — E559 Vitamin D deficiency, unspecified: Secondary | ICD-10-CM | POA: Insufficient documentation

## 2012-09-03 ENCOUNTER — Telehealth: Payer: Self-pay | Admitting: *Deleted

## 2012-09-03 NOTE — Telephone Encounter (Signed)
Pt states she had some light pink spotting when she woke up this morning. Pt states she is not experiencing any cramping or passing on any clots. Reviewed miscarriage precautions with patient.

## 2012-09-03 NOTE — Telephone Encounter (Signed)
Pt states she is having light pink spotting but has went away please call back.

## 2012-09-23 ENCOUNTER — Encounter (HOSPITAL_COMMUNITY): Payer: Self-pay | Admitting: *Deleted

## 2012-09-23 ENCOUNTER — Inpatient Hospital Stay (HOSPITAL_COMMUNITY)
Admission: AD | Admit: 2012-09-23 | Discharge: 2012-09-23 | Disposition: A | Discharge status: Home / Self Care | Payer: Medicaid Other | Source: Ambulatory Visit | Attending: Obstetrics & Gynecology | Admitting: Obstetrics & Gynecology

## 2012-09-23 DIAGNOSIS — K219 Gastro-esophageal reflux disease without esophagitis: Secondary | ICD-10-CM

## 2012-09-23 DIAGNOSIS — R109 Unspecified abdominal pain: Secondary | ICD-10-CM | POA: Insufficient documentation

## 2012-09-23 DIAGNOSIS — O99891 Other specified diseases and conditions complicating pregnancy: Secondary | ICD-10-CM | POA: Insufficient documentation

## 2012-09-23 LAB — URINALYSIS, ROUTINE W REFLEX MICROSCOPIC
Bilirubin Urine: NEGATIVE
Hgb urine dipstick: NEGATIVE
Ketones, ur: NEGATIVE mg/dL
Specific Gravity, Urine: 1.01 (ref 1.005–1.030)
pH: 7.5 (ref 5.0–8.0)

## 2012-09-23 LAB — URINE MICROSCOPIC-ADD ON

## 2012-09-23 MED ORDER — GI COCKTAIL ~~LOC~~
30.0000 mL | Freq: Once | ORAL | Status: AC
  Administered 2012-09-23: 30 mL via ORAL
  Filled 2012-09-23: qty 30

## 2012-09-23 MED ORDER — FAMOTIDINE-CA CARB-MAG HYDROX 10-800-165 MG PO CHEW: 2.0000 | CHEWABLE_TABLET | Freq: Two times a day (BID) | ORAL | Status: DC | PRN

## 2012-09-23 NOTE — MAU Note (Signed)
Sharp abdominal pain since 4 pm. Denies vaginal bleeding and discharge.

## 2012-09-23 NOTE — MAU Provider Note (Signed)
Chief Complaint: Abdominal Pain  First Provider Initiated Contact with Patient 09/23/12 2056     SUBJECTIVE HPI: Annette Wood is a 22 y.o. G3P2002 at [redacted]w[redacted]d by LMP who presents with sharp epiagstric pain since this afternoon. Reports Hx of same pain that has resolved w/ Pepcid, but ran out. Denies vaginal bleeding and discharge. Denies fever, chills, N/V/D. Unsure if related to eating.   Past Medical History  Diagnosis Date  . Gastroesophageal reflux   . Chlamydia   . Gonorrhea   . Panic attack    OB History   Grav Para Term Preterm Abortions TAB SAB Ect Mult Living   3 2 2       2      # Outc Date GA Lbr Len/2nd Wgt Sex Del Anes PTL Lv   1 TRM 1/10 [redacted]w[redacted]d  1.610RU([id number]) M SVD None No Yes   Comments: delivered in car   2 TRM 5/11 [redacted]w[redacted]d  3.402kg(7lb8oz) F SVD EPI No Yes   3 CUR              Past Surgical History  Procedure Laterality Date  . No past surgeries     History   Social History  . Marital Status: Single    Spouse Name: N/A    Number of Children: N/A  . Years of Education: N/A   Occupational History  . Not on file.   Social History Main Topics  . Smoking status: Never Smoker   . Smokeless tobacco: Never Used  . Alcohol Use: No  . Drug Use: No  . Sexually Active: Yes    Birth Control/ Protection: None   Other Topics Concern  . Not on file   Social History Narrative  . No narrative on file   No current facility-administered medications on file prior to encounter.   No current outpatient prescriptions on file prior to encounter.   No Known Allergies  ROS: Pertinent items in HPI  OBJECTIVE Blood pressure 119/77, pulse 75, temperature 97.5 F (36.4 C), temperature source Oral, resp. rate 18, height 5\' 3"  (1.6 m), weight 78.926 kg (174 lb), last menstrual period 07/01/2012. GENERAL: Well-developed, well-nourished female in no acute distress.  HEENT: Normocephalic HEART: normal rate RESP: normal effort ABDOMEN: Soft, non-tender. Neg Murphy sign.   EXTREMITIES: Nontender, no edema NEURO: Alert and oriented SPECULUM EXAM: Deferred. FHR 165 by doppler  LAB RESULTS No results found for this or any previous visit (from the past 24 hour(s)).  IMAGING No results found.  MAU COURSE GI cocktail given. Pain resolved.   ASSESSMENT 1. Gastroesophageal reflux in pregancy, first trimester    PLAN Discharge home in stable condition.      Follow-up Information   Follow up with [facility]. (as scheduled)    Contact information:   [address] [place] [address] [phone]      Follow up with THE [facility] OF [place] MATERNITY ADMISSIONS. (As needed in emergencies)    Contact information:   [address] [place] [address] [phone]       Medication List    STOP taking these medications       prenatal multivitamin Tabs      TAKE these medications       famotidine-calcium carbonate-magnesium hydroxide [phone] MG Chew  Commonly known as:  PEPCID COMPLETE  Chew 2 tablets by mouth 2 (two) times daily as needed.       [place], [place] 09/23/2012  9:04 PM

## 2012-09-24 ENCOUNTER — Encounter: Payer: Self-pay | Admitting: Obstetrics & Gynecology

## 2012-09-24 ENCOUNTER — Ambulatory Visit (INDEPENDENT_AMBULATORY_CARE_PROVIDER_SITE_OTHER): Payer: Medicaid Other | Admitting: Obstetrics & Gynecology

## 2012-09-24 ENCOUNTER — Other Ambulatory Visit: Payer: Self-pay | Admitting: Obstetrics & Gynecology

## 2012-09-24 VITALS — BP 106/73 | Temp 98.6°F | Wt 170.0 lb

## 2012-09-24 DIAGNOSIS — Z3481 Encounter for supervision of other normal pregnancy, first trimester: Secondary | ICD-10-CM

## 2012-09-24 DIAGNOSIS — Z3480 Encounter for supervision of other normal pregnancy, unspecified trimester: Secondary | ICD-10-CM | POA: Insufficient documentation

## 2012-09-24 DIAGNOSIS — K219 Gastro-esophageal reflux disease without esophagitis: Secondary | ICD-10-CM

## 2012-09-24 DIAGNOSIS — Z3682 Encounter for antenatal screening for nuchal translucency: Secondary | ICD-10-CM

## 2012-09-24 LAB — POCT URINALYSIS DIPSTICK
Nitrite, UA: NEGATIVE
Urobilinogen, UA: NEGATIVE

## 2012-09-24 MED ORDER — PANTOPRAZOLE SODIUM 40 MG PO TBEC: 40.0000 mg | DELAYED_RELEASE_TABLET | Freq: Every day | ORAL | Status: DC

## 2012-09-24 NOTE — Progress Notes (Signed)
P 94 Patient reports she was seen at the hospital last night- reflux. She is requesting a different medication due to cost.

## 2012-09-24 NOTE — Patient Instructions (Signed)
Heartburn During Pregnancy  Heartburn is a burning sensation in the chest caused by stomach acid backing up into the esophagus. Heartburn (also known as "reflux") is common in pregnancy because a certain hormone (progesterone) changes. The progesterone hormone may relax the valve that separates the esophagus from the stomach. This allows acid to go up into the esophagus, causing heartburn. Heartburn may also happen in pregnancy because the enlarging uterus pushes up on the stomach, which pushes more acid into the esophagus. This is especially true in the later stages of pregnancy. Heartburn problems usually go away after giving birth. CAUSES   The progesterone hormone.  Changing hormone levels.  The growing uterus that pushes stomach acid upward.  Large meals.  Certain foods and drinks.  Exercise.  Increased acid production. SYMPTOMS   Burning pain in the chest or lower throat.  Bitter taste in the mouth.  Coughing. DIAGNOSIS  Heartburn is typically diagnosed by your caregiver when taking a careful history of your concern. Your caregiver may order a blood test to check for a certain type of bacteria that is associated with heartburn. Sometimes, heartburn is diagnosed by prescribing a heartburn medicine to see if the symptoms improve. It is rare in pregnancy to have a procedure called an endoscopy. This is when a tube with a light and a camera on the end is used to examine the esophagus and the stomach. TREATMENT   Your caregiver may tell you to use certain over-the-counter medicines (antacids, acid reducers) for mild heartburn.  Your caregiver may prescribe medicines to decrease stomach acid or to protect your stomach lining.  Your caregiver may recommend certain diet changes.  For severe cases, your caregiver may recommend that the head of the bed be elevated on blocks. (Sleeping with more pillows is not an effective treatment as it only changes the position of your head and does  not improve the main problem of stomach acid refluxing into the esophagus.) HOME CARE INSTRUCTIONS   Take all medicines as directed by your caregiver.  Raise the head of your bed by putting blocks under the legs if instructed to by your caregiver.  Do not exercise right after eating.  Avoid eating 2 or 3 hours before bed. Do not lie down right after eating.  Eat small meals throughout the day instead of 3 large meals.  Identify foods and beverages that make your symptoms worse and avoid them. Foods you may want to avoid include:  Peppers.  Chocolate.  High-fat foods, including fried foods.  Spicy foods.  Garlic and onions.  Citrus fruits, including oranges, grapefruit, lemons, and limes.  Food containing tomatoes or tomato products.  Mint.  Carbonated and caffeinated drinks.  Vinegar. SEEK IMMEDIATE MEDICAL CARE IF:   You have severe chest pain that goes down your arm or into your jaw or neck.  You feel sweaty, dizzy, or lightheaded.  You become short of breath.  You vomit blood.  You have difficulty or pain with swallowing.  You have bloody or black, tarry stools.  You have episodes of heartburn more than 3 times a week, for more than 2 weeks. MAKE SURE YOU:  Understand these instructions.  Will watch your condition.  Will get help right away if you are not doing well or get worse. Document Released: 05/03/2000 Document Revised: 07/29/2011 Document Reviewed: 10/25/2010 [employer] Patient Information 2013 [employer].  

## 2012-09-24 NOTE — Progress Notes (Signed)
GERD symptoms

## 2012-09-25 ENCOUNTER — Other Ambulatory Visit: Payer: Self-pay | Admitting: Obstetrics & Gynecology

## 2012-09-25 ENCOUNTER — Ambulatory Visit (HOSPITAL_COMMUNITY)
Admission: RE | Admit: 2012-09-25 | Discharge: 2012-09-25 | Disposition: A | Discharge status: Home / Self Care | Payer: Medicaid Other | Source: Ambulatory Visit | Attending: Obstetrics & Gynecology | Admitting: Obstetrics & Gynecology

## 2012-09-25 ENCOUNTER — Encounter (HOSPITAL_COMMUNITY): Payer: Self-pay

## 2012-09-25 ENCOUNTER — Ambulatory Visit (HOSPITAL_COMMUNITY): Admission: RE | Admit: 2012-09-25 | Payer: Medicaid Other | Source: Ambulatory Visit

## 2012-09-25 VITALS — BP 99/65 | HR 83 | Wt 175.8 lb

## 2012-09-25 DIAGNOSIS — O3510X0 Maternal care for (suspected) chromosomal abnormality in fetus, unspecified, not applicable or unspecified: Secondary | ICD-10-CM | POA: Insufficient documentation

## 2012-09-25 DIAGNOSIS — O351XX0 Maternal care for (suspected) chromosomal abnormality in fetus, not applicable or unspecified: Secondary | ICD-10-CM | POA: Insufficient documentation

## 2012-09-25 DIAGNOSIS — Z3682 Encounter for antenatal screening for nuchal translucency: Secondary | ICD-10-CM

## 2012-09-25 DIAGNOSIS — Z3689 Encounter for other specified antenatal screening: Secondary | ICD-10-CM | POA: Insufficient documentation

## 2012-09-25 NOTE — Progress Notes (Signed)
Annette Wood  was seen today for an ultrasound appointment.  See full report in [facility].  Impression: Single IUP at 12 2/7 weeks Unable to measure an adequate NT  Recommendations: Recommend follow-up ultrasound examination in one week for first trimester screen.  Alpha Gula, MD

## 2012-09-25 NOTE — Addendum Note (Signed)
Encounter addended by: Lang Snow on: 09/25/2012  2:26 PM<BR>     Documentation filed: Orders

## 2012-09-25 NOTE — Addendum Note (Signed)
Encounter addended by: Tarron Krolak Diane Jesicca Dipierro on: 09/25/2012  2:26 PM<BR>     Documentation filed: Orders

## 2012-09-29 ENCOUNTER — Other Ambulatory Visit: Payer: Self-pay | Admitting: Obstetrics & Gynecology

## 2012-09-29 DIAGNOSIS — Z3682 Encounter for antenatal screening for nuchal translucency: Secondary | ICD-10-CM

## 2012-10-01 ENCOUNTER — Ambulatory Visit (HOSPITAL_COMMUNITY)
Admission: RE | Admit: 2012-10-01 | Discharge: 2012-10-01 | Disposition: A | Discharge status: Home / Self Care | Payer: Medicaid Other | Source: Ambulatory Visit | Attending: Obstetrics & Gynecology | Admitting: Obstetrics & Gynecology

## 2012-10-01 ENCOUNTER — Encounter (HOSPITAL_COMMUNITY): Payer: Self-pay

## 2012-10-01 VITALS — BP 113/69 | HR 76 | Wt 173.0 lb

## 2012-10-01 DIAGNOSIS — Z3689 Encounter for other specified antenatal screening: Secondary | ICD-10-CM | POA: Insufficient documentation

## 2012-10-01 DIAGNOSIS — Z1389 Encounter for screening for other disorder: Secondary | ICD-10-CM

## 2012-10-01 DIAGNOSIS — O3510X0 Maternal care for (suspected) chromosomal abnormality in fetus, unspecified, not applicable or unspecified: Secondary | ICD-10-CM | POA: Insufficient documentation

## 2012-10-01 DIAGNOSIS — K219 Gastro-esophageal reflux disease without esophagitis: Secondary | ICD-10-CM

## 2012-10-01 DIAGNOSIS — E559 Vitamin D deficiency, unspecified: Secondary | ICD-10-CM

## 2012-10-01 DIAGNOSIS — Z3682 Encounter for antenatal screening for nuchal translucency: Secondary | ICD-10-CM

## 2012-10-01 DIAGNOSIS — O351XX0 Maternal care for (suspected) chromosomal abnormality in fetus, not applicable or unspecified: Secondary | ICD-10-CM | POA: Insufficient documentation

## 2012-10-01 DIAGNOSIS — Z3481 Encounter for supervision of other normal pregnancy, first trimester: Secondary | ICD-10-CM

## 2012-10-01 DIAGNOSIS — Z363 Encounter for antenatal screening for malformations: Secondary | ICD-10-CM

## 2012-10-01 NOTE — Progress Notes (Signed)
Annette Wood  was seen today for an ultrasound appointment.  See full report in [facility].  Impression: Single IUP at 13 1/7 weeks Unable to measure an adequate NT due to fetal position.  Recommendations: Recommend follow-up ultrasound examination in 6 weeks for fetal anatomy Would offer quad screen for aneuploidy screening in the second trimester.  Alpha Gula, MD

## 2012-10-21 ENCOUNTER — Other Ambulatory Visit: Payer: Medicaid Other

## 2012-10-24 ENCOUNTER — Emergency Department (HOSPITAL_COMMUNITY)
Admission: EM | Admit: 2012-10-24 | Discharge: 2012-10-24 | Disposition: A | Discharge status: Home / Self Care | Payer: Medicaid Other | Attending: Emergency Medicine | Admitting: Emergency Medicine

## 2012-10-24 ENCOUNTER — Encounter (HOSPITAL_COMMUNITY): Payer: Self-pay | Admitting: *Deleted

## 2012-10-24 DIAGNOSIS — L089 Local infection of the skin and subcutaneous tissue, unspecified: Secondary | ICD-10-CM | POA: Insufficient documentation

## 2012-10-24 DIAGNOSIS — Z79899 Other long term (current) drug therapy: Secondary | ICD-10-CM | POA: Insufficient documentation

## 2012-10-24 DIAGNOSIS — Y929 Unspecified place or not applicable: Secondary | ICD-10-CM | POA: Insufficient documentation

## 2012-10-24 DIAGNOSIS — Z8659 Personal history of other mental and behavioral disorders: Secondary | ICD-10-CM | POA: Insufficient documentation

## 2012-10-24 DIAGNOSIS — Y939 Activity, unspecified: Secondary | ICD-10-CM | POA: Insufficient documentation

## 2012-10-24 DIAGNOSIS — T63461A Toxic effect of venom of wasps, accidental (unintentional), initial encounter: Secondary | ICD-10-CM | POA: Insufficient documentation

## 2012-10-24 DIAGNOSIS — K219 Gastro-esophageal reflux disease without esophagitis: Secondary | ICD-10-CM | POA: Insufficient documentation

## 2012-10-24 DIAGNOSIS — O9989 Other specified diseases and conditions complicating pregnancy, childbirth and the puerperium: Secondary | ICD-10-CM | POA: Insufficient documentation

## 2012-10-24 DIAGNOSIS — S90861A Insect bite (nonvenomous), right foot, initial encounter: Secondary | ICD-10-CM

## 2012-10-24 DIAGNOSIS — Z8619 Personal history of other infectious and parasitic diseases: Secondary | ICD-10-CM | POA: Insufficient documentation

## 2012-10-24 MED ORDER — DIPHENHYDRAMINE HCL 25 MG PO CAPS
25.0000 mg | ORAL_CAPSULE | Freq: Once | ORAL | Status: AC
  Administered 2012-10-24: 25 mg via ORAL
  Filled 2012-10-24: qty 1

## 2012-10-24 MED ORDER — DIPHENHYDRAMINE HCL 25 MG PO CAPS: 25.0000 mg | ORAL_CAPSULE | Freq: Four times a day (QID) | ORAL | Status: DC | PRN

## 2012-10-24 NOTE — ED Notes (Signed)
Skin intact/WDL.  R foot red & swollen, reports pain & itching. Denies sob or throat itching/swelling, speech clear. States, "was stung by a bee 2d ago", denies meds PTA or topicals applied.

## 2012-10-24 NOTE — ED Notes (Signed)
The pt was stung on the rt foot 2 days ago by a large black bee.  The swelling in her rt foot continues and she is 16 weeks preg

## 2012-10-24 NOTE — ED Provider Notes (Signed)
History     CSN: [id number]  Arrival date & time 10/24/12  0241   First MD Initiated Contact with Patient 10/24/12 0251      Chief Complaint  Patient presents with  . Insect Bite    (Consider location/radiation/quality/duration/timing/severity/associated sxs/prior treatment) HPI Comments: Patient reports being stung by a bumblebee 2, days ago, on her right foot.  Now.  She has swelling and erythema.  She has not taken any over-the-counter medication.  She alternates between twin applying heat and cold without any change in her symptoms.  She denies any shortness of breath, tachycardia, abdominal pain, or rash in any other locations  The history is provided by the patient.    Past Medical History  Diagnosis Date  . Gastroesophageal reflux   . Chlamydia   . Gonorrhea   . Panic attack     Past Surgical History  Procedure Laterality Date  . No past surgeries      Family History  Problem Relation Age of Onset  . Diabetes Maternal Grandmother   . Lupus Maternal Grandmother     History  Substance Use Topics  . Smoking status: Never Smoker   . Smokeless tobacco: Never Used  . Alcohol Use: No    OB History   Grav Para Term Preterm Abortions TAB SAB Ect Mult Living   3 2 2       2       Review of Systems  Constitutional: Negative for fever and chills.  HENT: Negative for congestion and rhinorrhea.   Cardiovascular: Negative for chest pain and palpitations.  Gastrointestinal: Negative for nausea.  Musculoskeletal: Negative for joint swelling.  Skin: Negative for rash and wound.  Neurological: Negative for dizziness, weakness and headaches.  All other systems reviewed and are negative.    Allergies  Review of patient's allergies indicates no known allergies.  Home Medications   Current Outpatient Rx  Name  Route  Sig  Dispense  Refill  . pantoprazole (PROTONIX) 40 MG tablet   Oral   Take 40 mg by mouth daily.         . diphenhydrAMINE (BENADRYL) 25 mg  capsule   Oral   Take 1 capsule (25 mg total) by mouth every 6 (six) hours as needed for itching.   12 capsule   0     BP 94/63  Pulse 81  Temp(Src) 97.6 F (36.4 C) (Oral)  Resp 18  SpO2 99%  LMP 07/01/2012  Physical Exam  Nursing note and vitals reviewed. Constitutional: She is oriented to person, place, and time. She appears well-developed and well-nourished.  HENT:  Head: Normocephalic.  Eyes: Pupils are equal, round, and reactive to light.  Neck: Normal range of motion.  Cardiovascular: Normal rate.   Pulmonary/Chest: Effort normal.  Musculoskeletal: She exhibits edema. She exhibits no tenderness.       Feet:  Neurological: She is alert and oriented to person, place, and time.  Skin: Skin is warm and dry. There is erythema.    ED Course  Procedures (including critical care time)  Labs Reviewed - No data to display No results found.   1. Insect bite of right foot, initial encounter       MDM   local reaction will give Benadryl, which is safe in her pregnancy         Arman Filter, NP 10/24/12 0404

## 2012-10-25 NOTE — ED Provider Notes (Signed)
Medical screening examination/treatment/procedure(s) were performed by non-physician practitioner and as supervising physician I was immediately available for consultation/collaboration.   Joya Gaskins, MD 10/25/12 [phone]

## 2012-11-04 ENCOUNTER — Encounter: Payer: Medicaid Other | Admitting: Obstetrics & Gynecology

## 2012-11-05 ENCOUNTER — Encounter: Payer: Self-pay | Admitting: Obstetrics & Gynecology

## 2012-11-05 ENCOUNTER — Ambulatory Visit (INDEPENDENT_AMBULATORY_CARE_PROVIDER_SITE_OTHER): Payer: Medicaid Other | Admitting: Obstetrics & Gynecology

## 2012-11-05 VITALS — BP 111/76 | Temp 98.7°F | Wt 179.8 lb

## 2012-11-05 DIAGNOSIS — Z3480 Encounter for supervision of other normal pregnancy, unspecified trimester: Secondary | ICD-10-CM

## 2012-11-05 DIAGNOSIS — Z3482 Encounter for supervision of other normal pregnancy, second trimester: Secondary | ICD-10-CM

## 2012-11-05 LAB — POCT URINALYSIS DIPSTICK
Blood, UA: NEGATIVE
Ketones, UA: NEGATIVE
Protein, UA: NEGATIVE
Spec Grav, UA: 1.02
pH, UA: 5

## 2012-11-05 NOTE — Patient Instructions (Addendum)
AFP Maternal This is a routine screen (tests) used to check for fetal abnormalities such as Down syndrome and neural tube defects. Down Syndrome is a chromosomal abnormality, sometimes called Trisomy 21. Neural tube defects are serious birth defects. The brain, spinal cord, or their coverings do not develop completely. Women should be tested in the 15th to 20th week of pregnancy. The msAFP screen involves three or four tests that measure substances found in the blood that make the testing better. During development, AFP levels in fetal blood and amniotic fluid rise until about 12 weeks. The levels then gradually fall until birth. AFP is a protein produce by fetal tissue. AFP crosses the placenta and appears in the maternal blood. A baby with an open neural tube defect has an opening in its spine, head, or abdominal wall that allows higher-than-usual amounts of AFP to pass into the mother's blood. If a screen is positive, more tests are needed to make a diagnosis. These include ultrasound and perhaps amniocentesis (checking the fluid that surrounds the baby). These tests are used to help women and their caregivers make decisions about the management of their pregnancies. In pregnancies where the fetus is carrying the chromosomal defect that results in Down syndrome, the levels of AFP and unconjugated estriol tend to be low and hCG and inhibin A levels high.  PREPARATION FOR TEST Blood is drawn from a vein in your arm usually between the 15th and 20th weeks of pregnancy. Four different tests on your blood are done. These are AFP, hCG, unconjugated estriol, and inhibin A. The combination of tests produces a more accurate result. NORMAL FINDINGS   Adult: less than 40ng/mL or less than 40 mg/L (SI units)  Child younger than1 year: less than 30 ng/mL Ranges are stratified by weeks of gestation and vary among laboratories. Ranges for normal findings may vary among different laboratories and hospitals. You  should always check with your doctor after having lab work or other tests done to discuss the meaning of your test results and whether your values are considered within normal limits. MEANING OF TEST  These are screening tests. Not all fetal abnormalities will give positive test results. Of all women who have positive AFP screening results, only a very small number of them have babies who actually have a neural tube defect or chromosomal abnormality. Your caregiver will go over the test results with you and discuss the importance and meaning of your results, as well as treatment options and the need for additional tests if necessary. OBTAINING THE TEST RESULTS It is your responsibility to obtain your test results. Ask the lab or department performing the test when and how you will get your results. Document Released: 05/28/2004 Document Revised: 07/29/2011 Document Reviewed: 04/09/2008 [employer] Patient Information 2014 [employer].  

## 2012-11-05 NOTE — Progress Notes (Signed)
Pulse: 85

## 2012-11-09 LAB — AFP, QUAD SCREEN
Age Alone: 1:1170 {titer}
Curr Gest Age: 18.1 wks.days
Down Syndrome Scr Risk Est: <1:38500 {titer}
INH: 89.8 pg/mL
MoM for INH: 0.55
MoM for hCG: 0.61
Osb Risk: 1:4920 {titer}
Trisomy 18 (Edward) Syndrome Interp.: 1:58700 {titer}

## 2012-11-10 NOTE — Progress Notes (Signed)
Doing well 

## 2012-11-12 ENCOUNTER — Other Ambulatory Visit (HOSPITAL_COMMUNITY): Payer: Self-pay | Admitting: Maternal and Fetal Medicine

## 2012-11-12 ENCOUNTER — Ambulatory Visit (HOSPITAL_COMMUNITY)
Admission: RE | Admit: 2012-11-12 | Discharge: 2012-11-12 | Disposition: A | Discharge status: Home / Self Care | Payer: Medicaid Other | Source: Ambulatory Visit | Attending: Obstetrics & Gynecology | Admitting: Obstetrics & Gynecology

## 2012-11-12 DIAGNOSIS — Z1389 Encounter for screening for other disorder: Secondary | ICD-10-CM | POA: Insufficient documentation

## 2012-11-12 DIAGNOSIS — O358XX0 Maternal care for other (suspected) fetal abnormality and damage, not applicable or unspecified: Secondary | ICD-10-CM | POA: Insufficient documentation

## 2012-11-12 DIAGNOSIS — Z363 Encounter for antenatal screening for malformations: Secondary | ICD-10-CM | POA: Insufficient documentation

## 2012-11-12 NOTE — Progress Notes (Signed)
Annette Wood  was seen today for an ultrasound appointment.  See full report in [facility].  Impression: Single IUP at 19 1/7 weeks Normal fetal anatomic survey No markers associated with fetal aneuploidy noted Normal amniotic fluid volume  Recommendations: Follow-up ultrasounds as clinically indicated.   Alpha Gula, MD

## 2012-11-22 ENCOUNTER — Encounter (HOSPITAL_COMMUNITY): Payer: Self-pay | Admitting: *Deleted

## 2012-11-22 ENCOUNTER — Inpatient Hospital Stay (EMERGENCY_DEPARTMENT_HOSPITAL)
Admission: AD | Admit: 2012-11-22 | Discharge: 2012-11-22 | Disposition: A | Discharge status: Home / Self Care | Payer: Medicaid Other | Source: Ambulatory Visit | Attending: Obstetrics & Gynecology | Admitting: Obstetrics & Gynecology

## 2012-11-22 ENCOUNTER — Inpatient Hospital Stay (HOSPITAL_COMMUNITY): Payer: Medicaid Other

## 2012-11-22 DIAGNOSIS — O99891 Other specified diseases and conditions complicating pregnancy: Secondary | ICD-10-CM | POA: Diagnosis present

## 2012-11-22 DIAGNOSIS — K8020 Calculus of gallbladder without cholecystitis without obstruction: Secondary | ICD-10-CM

## 2012-11-22 DIAGNOSIS — K219 Gastro-esophageal reflux disease without esophagitis: Secondary | ICD-10-CM | POA: Diagnosis present

## 2012-11-22 DIAGNOSIS — E871 Hypo-osmolality and hyponatremia: Secondary | ICD-10-CM | POA: Diagnosis present

## 2012-11-22 DIAGNOSIS — E876 Hypokalemia: Secondary | ICD-10-CM | POA: Diagnosis present

## 2012-11-22 DIAGNOSIS — O9989 Other specified diseases and conditions complicating pregnancy, childbirth and the puerperium: Principal | ICD-10-CM | POA: Diagnosis present

## 2012-11-22 DIAGNOSIS — K8000 Calculus of gallbladder with acute cholecystitis without obstruction: Secondary | ICD-10-CM | POA: Diagnosis present

## 2012-11-22 DIAGNOSIS — K8050 Calculus of bile duct without cholangitis or cholecystitis without obstruction: Secondary | ICD-10-CM

## 2012-11-22 LAB — COMPREHENSIVE METABOLIC PANEL
ALT: 69 U/L — ABNORMAL HIGH (ref 0–35)
AST: 27 U/L (ref 0–37)
Alkaline Phosphatase: 110 U/L (ref 39–117)
CO2: 23 mEq/L (ref 19–32)
GFR calc Af Amer: >90 mL/min (ref >90)
GFR calc non Af Amer: >90 mL/min (ref >90)
Glucose, Bld: 101 mg/dL — ABNORMAL HIGH (ref 70–99)
Potassium: 3.5 mEq/L (ref 3.5–5.1)
Sodium: 135 mEq/L (ref 135–145)

## 2012-11-22 LAB — CBC
Hemoglobin: 11.1 g/dL — ABNORMAL LOW (ref 12.0–15.0)
Platelets: 233 10*3/uL (ref 150–400)
RBC: 3.75 MIL/uL — ABNORMAL LOW (ref 3.87–5.11)
WBC: 11.5 10*3/uL — ABNORMAL HIGH (ref 4.0–10.5)

## 2012-11-22 MED ORDER — ONDANSETRON 8 MG PO TBDP
8.0000 mg | ORAL_TABLET | Freq: Once | ORAL | Status: AC
  Administered 2012-11-22: 8 mg via ORAL
  Filled 2012-11-22: qty 1

## 2012-11-22 MED ORDER — OXYCODONE-ACETAMINOPHEN 5-325 MG PO TABS: 1.0000 | ORAL_TABLET | ORAL | Status: DC | PRN

## 2012-11-22 MED ORDER — PANTOPRAZOLE SODIUM 40 MG PO TBEC: 40.0000 mg | DELAYED_RELEASE_TABLET | Freq: Every day | ORAL | Status: DC

## 2012-11-22 MED ORDER — HYDROMORPHONE HCL PF 1 MG/ML IJ SOLN
1.0000 mg | Freq: Once | INTRAMUSCULAR | Status: AC
  Administered 2012-11-22: 1 mg via INTRAMUSCULAR
  Filled 2012-11-22: qty 1

## 2012-11-22 NOTE — MAU Provider Note (Signed)
History     CSN: [id number]  Arrival date and time: 11/22/12 1013   None     No chief complaint on file.  HPI 22 y.o. [id number] at [redacted]w[redacted]d with epigastric pain starting around 2 AM this morning, + n/v since onset of pain, constant, sharp. Has h/o acid reflux, states she usually has this type of pain with her acid reflux. Has been taking TUMS q 2 hours with no relief, used to take protonix, but hasn't had it for 2 weeks.   Past Medical History  Diagnosis Date  . Gastroesophageal reflux   . Chlamydia   . Gonorrhea   . Panic attack     Past Surgical History  Procedure Laterality Date  . No past surgeries      Family History  Problem Relation Age of Onset  . Diabetes Maternal Grandmother   . Lupus Maternal Grandmother     History  Substance Use Topics  . Smoking status: Never Smoker   . Smokeless tobacco: Never Used  . Alcohol Use: No    Allergies: No Known Allergies  No prescriptions prior to admission    Review of Systems  Constitutional: Negative.  Negative for fever, chills and malaise/fatigue.  Respiratory: Negative.   Cardiovascular: Negative.   Gastrointestinal: Positive for nausea, vomiting and abdominal pain. Negative for diarrhea and constipation.  Genitourinary: Negative for dysuria, urgency, frequency, hematuria and flank pain.       Negative for vaginal bleeding, cramping/contractions  Musculoskeletal: Negative.   Neurological: Negative.   Psychiatric/Behavioral: Negative.    Physical Exam   Blood pressure 121/75, pulse 71, temperature 98 F (36.7 C), temperature source Oral, resp. rate 18, last menstrual period 07/01/2012.  Physical Exam  Nursing note and vitals reviewed. Constitutional: She is oriented to person, place, and time. She appears well-developed and well-nourished. No distress.  Cardiovascular: Normal rate.   Respiratory: Effort normal.  GI: Soft. She exhibits no distension and no mass. There is tenderness (epigastric). There is no  rebound and no guarding.  Musculoskeletal: Normal range of motion.  Neurological: She is alert and oriented to person, place, and time.  Skin: Skin is warm and dry.  Psychiatric: She has a normal mood and affect.    MAU Course  Procedures  Results for orders placed during the hospital encounter of 11/22/12 (from the past 24 hour(s))  CBC     Status: Abnormal   Collection Time    11/22/12 10:30 AM      Result Value Range   WBC 11.5 (*) 4.0 - 10.5 K/uL   RBC 3.75 (*) 3.87 - 5.11 MIL/uL   Hemoglobin 11.1 (*) 12.0 - 15.0 g/dL   HCT 65.7 (*) 84.6 - 96.2 %   MCV 84.0  78.0 - 100.0 fL   MCH 29.6  26.0 - 34.0 pg   MCHC 35.2  30.0 - 36.0 g/dL   RDW 95.2  84.1 - 32.4 %   Platelets 233  150 - 400 K/uL  COMPREHENSIVE METABOLIC PANEL     Status: Abnormal   Collection Time    11/22/12 10:30 AM      Result Value Range   Sodium 135  135 - 145 mEq/L   Potassium 3.5  3.5 - 5.1 mEq/L   Chloride 101  96 - 112 mEq/L   CO2 23  19 - 32 mEq/L   Glucose, Bld 101 (*) 70 - 99 mg/dL   BUN 6  6 - 23 mg/dL   Creatinine, Ser 4.01  0.50 -  1.10 mg/dL   Calcium 9.1  8.4 - 16.1 mg/dL   Total Protein 7.0  6.0 - 8.3 g/dL   Albumin 3.1 (*) 3.5 - 5.2 g/dL   AST 27  0 - 37 U/L   ALT 69 (*) 0 - 35 U/L   Alkaline Phosphatase 110  39 - 117 U/L   Total Bilirubin 0.3  0.3 - 1.2 mg/dL   GFR calc non Af Amer >90  >90 mL/min   GFR calc Af Amer >90  >90 mL/min  AMYLASE     Status: Abnormal   Collection Time    11/22/12 10:30 AM      Result Value Range   Amylase 107 (*) 0 - 105 U/L  LIPASE, BLOOD     Status: None   Collection Time    11/22/12 10:30 AM      Result Value Range   Lipase 58  11 - 59 U/L     US Abdomen Limited Ruq  11/22/2012   *RADIOLOGY REPORT*  Clinical Data:  Epigastric pain.  GALLBLADDER ULTRASOUND  Comparison:  None  Findings:  Gallbladder:  There is mild gallbladder wall thickening measuring up to 3.2 cm.  Multiple stones are identified within the lumen of the gallbladder which measure up  to 7 mm.  Negative sonographic Murphy's sign.  Common Bile Duct:  Within normal limits in caliber.  Liver:  No focal lesion identified.  Within normal limits in parenchymal echogenicity.  IMPRESSION:  1.  Gallstones and gallbladder wall thickening.  Cannot rule out cholecystitis.   Original Report Authenticated By: Signa Kell, M.D.    Assessment and Plan   1. Gallstones   2. Biliary colic   Consulted with Dr. Gaynell Face, Rx percocet, low fat diet, f/u tomorrow with Dr. Verdell Carmine office If epigastric pain, n/v worsen or if fever develops, f/u at [facility] ED    Medication List         calcium carbonate 500 MG chewable tablet  Commonly known as:  TUMS - dosed in mg elemental calcium  Chew 1 tablet by mouth daily as needed for heartburn.     oxyCODONE-acetaminophen 5-325 MG per tablet  Commonly known as:  PERCOCET/ROXICET  Take 1 tablet by mouth every 4 (four) hours as needed for pain.     pantoprazole 40 MG tablet  Commonly known as:  PROTONIX  Take 1 tablet (40 mg total) by mouth daily.        Follow-up Information   Follow up with Roseanna Rainbow, MD. Call on 11/23/2012.   Contact information:   [address] [place] [address] [phone]         Georges Mouse 11/22/2012, 1:03 PM

## 2012-11-22 NOTE — MAU Note (Signed)
Pt reports she started having sharp abd pain in epigastric area around 2 am. Pt stated she ate earlier that evening but felt OK until 2am. Had N/V as well. No vag bleeding or discharge noted.

## 2012-11-23 ENCOUNTER — Inpatient Hospital Stay (HOSPITAL_COMMUNITY)
Admission: EM | Admit: 2012-11-23 | Discharge: 2012-11-25 | DRG: 781 | Disposition: A | Discharge status: Home / Self Care | Payer: Medicaid Other | Attending: General Surgery | Admitting: General Surgery

## 2012-11-23 ENCOUNTER — Emergency Department (HOSPITAL_COMMUNITY): Payer: Medicaid Other

## 2012-11-23 ENCOUNTER — Encounter (HOSPITAL_COMMUNITY): Payer: Self-pay

## 2012-11-23 DIAGNOSIS — Z331 Pregnant state, incidental: Secondary | ICD-10-CM

## 2012-11-23 DIAGNOSIS — K810 Acute cholecystitis: Secondary | ICD-10-CM

## 2012-11-23 DIAGNOSIS — K8010 Calculus of gallbladder with chronic cholecystitis without obstruction: Secondary | ICD-10-CM

## 2012-11-23 DIAGNOSIS — K8000 Calculus of gallbladder with acute cholecystitis without obstruction: Secondary | ICD-10-CM | POA: Diagnosis present

## 2012-11-23 DIAGNOSIS — E876 Hypokalemia: Secondary | ICD-10-CM

## 2012-11-23 DIAGNOSIS — E871 Hypo-osmolality and hyponatremia: Secondary | ICD-10-CM

## 2012-11-23 HISTORY — DX: Calculus of gallbladder without cholecystitis without obstruction: K80.20

## 2012-11-23 LAB — CBC WITH DIFFERENTIAL/PLATELET
Eosinophils Absolute: 0.1 10*3/uL (ref 0.0–0.7)
Eosinophils Relative: 1 % (ref 0–5)
HCT: 32.9 % — ABNORMAL LOW (ref 36.0–46.0)
Lymphocytes Relative: 15 % (ref 12–46)
Lymphs Abs: 1.3 10*3/uL (ref 0.7–4.0)
MCH: 29.5 pg (ref 26.0–34.0)
MCV: 85 fL (ref 78.0–100.0)
Monocytes Absolute: 0.8 10*3/uL (ref 0.1–1.0)
Platelets: 225 10*3/uL (ref 150–400)
RBC: 3.87 MIL/uL (ref 3.87–5.11)
RDW: 13.7 % (ref 11.5–15.5)
WBC: 9 10*3/uL (ref 4.0–10.5)

## 2012-11-23 LAB — COMPREHENSIVE METABOLIC PANEL
CO2: 24 mEq/L (ref 19–32)
Calcium: 9 mg/dL (ref 8.4–10.5)
Creatinine, Ser: 0.66 mg/dL (ref 0.50–1.10)
GFR calc Af Amer: >90 mL/min (ref >90)
GFR calc non Af Amer: >90 mL/min (ref >90)
Glucose, Bld: 78 mg/dL (ref 70–99)
Sodium: 132 mEq/L — ABNORMAL LOW (ref 135–145)
Total Protein: 7.4 g/dL (ref 6.0–8.3)

## 2012-11-23 LAB — URINALYSIS, ROUTINE W REFLEX MICROSCOPIC
Hgb urine dipstick: NEGATIVE
Nitrite: NEGATIVE
Protein, ur: NEGATIVE mg/dL
Specific Gravity, Urine: 1.015 (ref 1.005–1.030)
Urobilinogen, UA: 0.2 mg/dL (ref 0.0–1.0)

## 2012-11-23 LAB — URINE MICROSCOPIC-ADD ON

## 2012-11-23 LAB — LIPASE, BLOOD: Lipase: 51 U/L (ref 11–59)

## 2012-11-23 MED ORDER — HYDROMORPHONE HCL PF 1 MG/ML IJ SOLN
1.0000 mg | Freq: Once | INTRAMUSCULAR | Status: AC
  Administered 2012-11-23: 1 mg via INTRAVENOUS
  Filled 2012-11-23: qty 1

## 2012-11-23 MED ORDER — DIPHENHYDRAMINE HCL 50 MG/ML IJ SOLN: 12.5000 mg | Freq: Four times a day (QID) | INTRAMUSCULAR | Status: DC | PRN

## 2012-11-23 MED ORDER — DIPHENHYDRAMINE HCL 12.5 MG/5ML PO ELIX: 12.5000 mg | ORAL_SOLUTION | Freq: Four times a day (QID) | ORAL | Status: DC | PRN

## 2012-11-23 MED ORDER — KCL IN DEXTROSE-NACL 20-5-0.45 MEQ/L-%-% IV SOLN
INTRAVENOUS | Status: DC
  Administered 2012-11-23 – 2012-11-24 (×2): via INTRAVENOUS
  Filled 2012-11-23 (×4): qty 1000

## 2012-11-23 MED ORDER — HEPARIN SODIUM (PORCINE) 5000 UNIT/ML IJ SOLN
5000.0000 [IU] | Freq: Once | INTRAMUSCULAR | Status: AC
  Administered 2012-11-23: 5000 [IU] via SUBCUTANEOUS
  Filled 2012-11-23: qty 1

## 2012-11-23 MED ORDER — MORPHINE SULFATE 2 MG/ML IJ SOLN
1.0000 mg | INTRAMUSCULAR | Status: DC | PRN
  Administered 2012-11-23 – 2012-11-25 (×9): 2 mg via INTRAVENOUS
  Administered 2012-11-25: 1 mg via INTRAVENOUS
  Filled 2012-11-23 (×10): qty 1

## 2012-11-23 MED ORDER — PIPERACILLIN-TAZOBACTAM 3.375 G IVPB
3.3750 g | Freq: Three times a day (TID) | INTRAVENOUS | Status: DC
  Administered 2012-11-23 – 2012-11-24 (×2): 3.375 g via INTRAVENOUS
  Filled 2012-11-23 (×4): qty 50

## 2012-11-23 MED ORDER — PANTOPRAZOLE SODIUM 40 MG IV SOLR
40.0000 mg | Freq: Every day | INTRAVENOUS | Status: DC
  Administered 2012-11-23 – 2012-11-24 (×2): 40 mg via INTRAVENOUS
  Filled 2012-11-23 (×3): qty 40

## 2012-11-23 MED ORDER — ONDANSETRON HCL 4 MG/2ML IJ SOLN: 4.0000 mg | Freq: Four times a day (QID) | INTRAMUSCULAR | Status: DC | PRN

## 2012-11-23 NOTE — ED Notes (Addendum)
Patient reports that she went to [facility] hospital yesterday and was diagnosed with gallstones. Patient states she received an Rx for Percocet, but Percocet does not relieve the pain Patient states the pain began 3 days ago in the right upper quadrant. Patient states last period 07/01/12. Estimated delivery 04/07/2013

## 2012-11-23 NOTE — ED Provider Notes (Signed)
Medical screening examination/treatment/procedure(s) were conducted as a shared visit with non-physician practitioner(s) and myself.  I personally evaluated the patient during the encounter   Shelda Jakes, MD 11/23/12 [phone]

## 2012-11-23 NOTE — ED Provider Notes (Signed)
Medical screening examination/treatment/procedure(s) were conducted as a shared visit with non-physician practitioner(s) and myself.  I personally evaluated the patient during the encounter   Patient seen by me. Patient is [redacted] weeks pregnant. Patient with persistent right upper quadrant pain now for 3 days without any relief. Patient ultrasound at [facility] hospital yesterday which showed cholelithiasis and some questionable thickening of the gallbladder wall. Persistent symptoms are concerning for prolonged biliary colic or perhaps early acute cholecystitis. Patient is tender to palpation in the right upper quadrant. Lab workup without any significant leukocytosis liver function test abnormalities or elevation in the lipase. We'll repeat the ultrasound for comparison to see if is worse thickening of the gallbladder. Either way to consult Gen. surgery emesis pain goes away completely. Patient may very well require admission. In addition she's not  Eating  very well because of the discomfort is made worse by food. Most likely will require admission and by mouth status and observation.   Results for orders placed during the hospital encounter of 11/23/12  CBC WITH DIFFERENTIAL      Result Value Range   WBC 9.0  4.0 - 10.5 K/uL   RBC 3.87  3.87 - 5.11 MIL/uL   Hemoglobin 11.4 (*) 12.0 - 15.0 g/dL   HCT 16.1 (*) 09.6 - 04.5 %   MCV 85.0  78.0 - 100.0 fL   MCH 29.5  26.0 - 34.0 pg   MCHC 34.7  30.0 - 36.0 g/dL   RDW 40.9  81.1 - 91.4 %   Platelets 225  150 - 400 K/uL   Neutrophils Relative % 76  43 - 77 %   Neutro Abs 6.8  1.7 - 7.7 K/uL   Lymphocytes Relative 15  12 - 46 %   Lymphs Abs 1.3  0.7 - 4.0 K/uL   Monocytes Relative 8  3 - 12 %   Monocytes Absolute 0.8  0.1 - 1.0 K/uL   Eosinophils Relative 1  0 - 5 %   Eosinophils Absolute 0.1  0.0 - 0.7 K/uL   Basophils Relative 0  0 - 1 %   Basophils Absolute 0.0  0.0 - 0.1 K/uL  COMPREHENSIVE METABOLIC PANEL      Result Value Range   Sodium 132  (*) 135 - 145 mEq/L   Potassium 3.4 (*) 3.5 - 5.1 mEq/L   Chloride 98  96 - 112 mEq/L   CO2 24  19 - 32 mEq/L   Glucose, Bld 78  70 - 99 mg/dL   BUN 5 (*) 6 - 23 mg/dL   Creatinine, Ser 7.82  0.50 - 1.10 mg/dL   Calcium 9.0  8.4 - 95.6 mg/dL   Total Protein 7.4  6.0 - 8.3 g/dL   Albumin 3.0 (*) 3.5 - 5.2 g/dL   AST 17  0 - 37 U/L   ALT 47 (*) 0 - 35 U/L   Alkaline Phosphatase 109  39 - 117 U/L   Total Bilirubin 0.7  0.3 - 1.2 mg/dL   GFR calc non Af Amer >90  >90 mL/min   GFR calc Af Amer >90  >90 mL/min  LIPASE, BLOOD      Result Value Range   Lipase 51  11 - 59 U/L     Shelda Jakes, MD 11/23/12 1614

## 2012-11-23 NOTE — ED Notes (Signed)
Fetal heart tones present at 152.

## 2012-11-23 NOTE — ED Provider Notes (Signed)
History    CSN: [id number] Arrival date & time 11/23/12  1422  First MD Initiated Contact with Patient 11/23/12 1448     Chief Complaint  Patient presents with  . Abdominal Pain    right uppr quadrant   (Consider location/radiation/quality/duration/timing/severity/associated sxs/prior Treatment) HPI Comments: This is a [redacted] week pregnant female, who presents to the emergency department with chief complaint of right upper quadrant pain. Patient states that she was seen and evaluated yesterday at [facility] hospital, and was found to have gallstones. She was instructed to followup with Dr. Clearance Coots today, but was unable to followup. She is complaining of increasing right upper quadrant pain. She denies nausea and vomiting. She states that she's been taking Percocet with no relief. Nothing makes her symptoms better or worse. She denies fevers, or chills. Denies vaginal discharge or dysuria.  The history is provided by the patient. No language interpreter was used.   Past Medical History  Diagnosis Date  . Gastroesophageal reflux   . Chlamydia   . Gonorrhea   . Panic attack   . Gallstones    Past Surgical History  Procedure Laterality Date  . No past surgeries     Family History  Problem Relation Age of Onset  . Diabetes Maternal Grandmother   . Lupus Maternal Grandmother    History  Substance Use Topics  . Smoking status: Never Smoker   . Smokeless tobacco: Never Used  . Alcohol Use: No   OB History   Grav Para Term Preterm Abortions TAB SAB Ect Mult Living   3 2 2       2      Review of Systems  All other systems reviewed and are negative.    Allergies  Review of patient's allergies indicates no known allergies.  Home Medications   Current Outpatient Rx  Name  Route  Sig  Dispense  Refill  . calcium carbonate (TUMS - DOSED IN MG ELEMENTAL CALCIUM) 500 MG chewable tablet   Oral   Chew 1 tablet by mouth daily as needed for heartburn.         Marland Kitchen  oxyCODONE-acetaminophen (PERCOCET/ROXICET) 5-325 MG per tablet   Oral   Take 1 tablet by mouth every 4 (four) hours as needed for pain.   30 tablet   0   . pantoprazole (PROTONIX) 40 MG tablet   Oral   Take 1 tablet (40 mg total) by mouth daily.   30 tablet   3    BP 115/61  Pulse 86  Temp(Src) 98.6 F (37 C) (Oral)  Resp 18  SpO2 100%  LMP 07/01/2012 Physical Exam  Nursing note and vitals reviewed. Constitutional: She is oriented to person, place, and time. She appears well-developed and well-nourished.  HENT:  Head: Normocephalic and atraumatic.  Eyes: Conjunctivae and EOM are normal. Pupils are equal, round, and reactive to light.  Neck: Normal range of motion. Neck supple.  Cardiovascular: Normal rate and regular rhythm.  Exam reveals no gallop and no friction rub.   No murmur heard. Pulmonary/Chest: Effort normal and breath sounds normal. No respiratory distress. She has no wheezes. She has no rales. She exhibits no tenderness.  Abdominal: Soft. Bowel sounds are normal. She exhibits no distension and no mass. There is tenderness. There is no rebound and no guarding.  Right upper quadrant tenderness palpation, no right lower cord tenderness, no left lower quadrant tenderness, no peritoneal signs  Musculoskeletal: Normal range of motion. She exhibits no edema and no tenderness.  Neurological: She is alert and oriented to person, place, and time.  Skin: Skin is warm and dry.  Psychiatric: She has a normal mood and affect. Her behavior is normal. Judgment and thought content normal.    ED Course  Procedures (including critical care time) Labs Reviewed  CBC WITH DIFFERENTIAL  COMPREHENSIVE METABOLIC PANEL  LIPASE, BLOOD  PREGNANCY, URINE  URINALYSIS, ROUTINE W REFLEX MICROSCOPIC   Results for orders placed during the hospital encounter of 11/23/12  CBC WITH DIFFERENTIAL      Result Value Range   WBC 9.0  4.0 - 10.5 K/uL   RBC 3.87  3.87 - 5.11 MIL/uL   Hemoglobin  11.4 (*) 12.0 - 15.0 g/dL   HCT 16.1 (*) 09.6 - 04.5 %   MCV 85.0  78.0 - 100.0 fL   MCH 29.5  26.0 - 34.0 pg   MCHC 34.7  30.0 - 36.0 g/dL   RDW 40.9  81.1 - 91.4 %   Platelets 225  150 - 400 K/uL   Neutrophils Relative % 76  43 - 77 %   Neutro Abs 6.8  1.7 - 7.7 K/uL   Lymphocytes Relative 15  12 - 46 %   Lymphs Abs 1.3  0.7 - 4.0 K/uL   Monocytes Relative 8  3 - 12 %   Monocytes Absolute 0.8  0.1 - 1.0 K/uL   Eosinophils Relative 1  0 - 5 %   Eosinophils Absolute 0.1  0.0 - 0.7 K/uL   Basophils Relative 0  0 - 1 %   Basophils Absolute 0.0  0.0 - 0.1 K/uL  COMPREHENSIVE METABOLIC PANEL      Result Value Range   Sodium 132 (*) 135 - 145 mEq/L   Potassium 3.4 (*) 3.5 - 5.1 mEq/L   Chloride 98  96 - 112 mEq/L   CO2 24  19 - 32 mEq/L   Glucose, Bld 78  70 - 99 mg/dL   BUN 5 (*) 6 - 23 mg/dL   Creatinine, Ser 7.82  0.50 - 1.10 mg/dL   Calcium 9.0  8.4 - 95.6 mg/dL   Total Protein 7.4  6.0 - 8.3 g/dL   Albumin 3.0 (*) 3.5 - 5.2 g/dL   AST 17  0 - 37 U/L   ALT 47 (*) 0 - 35 U/L   Alkaline Phosphatase 109  39 - 117 U/L   Total Bilirubin 0.7  0.3 - 1.2 mg/dL   GFR calc non Af Amer >90  >90 mL/min   GFR calc Af Amer >90  >90 mL/min  LIPASE, BLOOD      Result Value Range   Lipase 51  11 - 59 U/L   US Abdomen Complete  11/23/2012   *RADIOLOGY REPORT*  Clinical Data:  [redacted] weeks pregnant, right upper quadrant pain  COMPLETE ABDOMINAL ULTRASOUND  Comparison:  RUQ ultrasound dated 11/22/2012  Findings:  Gallbladder:  Cholelithiasis, including a 1.2 cm nonmobile gallstone in the gallbladder neck.  Mild gallbladder wall thickening, measuring 6 mm, increased.  Positive sonographic Murphy's sign.  Common bile duct:  Dilated, measuring 9 mm.  No choledocholithiasis is seen.  Liver:  No focal lesion identified.  Within normal limits in parenchymal echogenicity.  Mild intrahepatic ductal dilatation.  IVC:  Appears normal.  Pancreas:  Poorly visualized due to overlying bowel gas.  Spleen:  Measures  7.7 cm.  Right Kidney:  Measures 13.1 cm.  Moderate right hydronephrosis.  Left Kidney:  Measures 12.1 cm.  No mass or  hydronephrosis.  Abdominal aorta:  No aneurysm identified.  IMPRESSION: Cholelithiasis with gallbladder wall thickening and positive sonographic Murphy's sign, suspicious for acute cholecystitis.  Suspected mild intrahepatic/extrahepatic ductal dilatation.  Common duct measures 9 mm.  No choledocholithiasis is evident by ultrasound.  Moderate right hydronephrosis.  While the etiology is unclear by ultrasound, at least some of this may reflect extrinsic compression related to the gravid uterus.   Original Report Authenticated By: Charline Bills, M.D.   US Ob Detail + 14 Wk  11/12/2012   OBSTETRICAL ULTRASOUND: This exam was performed within a [facility]. The OB US report was generated in the [facility] system, and faxed to the ordering physician.   This report is also available in [employer] and in the [employer]. See [facility] Obstetric US report.  US Abdomen Limited Ruq  11/22/2012   *RADIOLOGY REPORT*  Clinical Data:  Epigastric pain.  GALLBLADDER ULTRASOUND  Comparison:  None  Findings:  Gallbladder:  There is mild gallbladder wall thickening measuring up to 3.2 cm.  Multiple stones are identified within the lumen of the gallbladder which measure up to 7 mm.  Negative sonographic Murphy's sign.  Common Bile Duct:  Within normal limits in caliber.  Liver:  No focal lesion identified.  Within normal limits in parenchymal echogenicity.  IMPRESSION:  1.  Gallstones and gallbladder wall thickening.  Cannot rule out cholecystitis.   Original Report Authenticated By: Signa Kell, M.D.     US Abdomen Limited Ruq  11/22/2012   *RADIOLOGY REPORT*  Clinical Data:  Epigastric pain.  GALLBLADDER ULTRASOUND  Comparison:  None  Findings:  Gallbladder:  There is mild gallbladder wall thickening measuring up to 3.2 cm.  Multiple stones are identified within the lumen  of the gallbladder which measure up to 7 mm.  Negative sonographic Murphy's sign.  Common Bile Duct:  Within normal limits in caliber.  Liver:  No focal lesion identified.  Within normal limits in parenchymal echogenicity.  IMPRESSION:  1.  Gallstones and gallbladder wall thickening.  Cannot rule out cholecystitis.   Original Report Authenticated By: Signa Kell, M.D.   1. Cholecystitis, acute     MDM  Patient with RUQ pain.  3:33 PM Discussed the patient with Dr. Deretha Emory, who recommends pain control with dilaudid, and repeat RUQ [place].  7:20 PM Patient seen by Dr. Andrey Campanile from surgery who will admit the patient.  Surgery tomorrow.  Roxy Horseman, PA-C 11/23/12 1921

## 2012-11-23 NOTE — H&P (Signed)
Annette Wood is an 22 y.o. female.   Chief Complaint: abdominal pain  HPI: 22 year old G3 P2 at [redacted]w[redacted]d IUP presents to the emergency room with persistent epigastric and right Upper quadrant pain. Patient presented to [facility] emergency room yesterday (7/6) complaining of awaking at 2 AM with sharp epigastric pain associated with nausea and vomiting.Patient states that she initially developed some discomfort in her upper abdomen on Saturday but thought it was probably due to reflux. She had taken TUMS without any relief. She had an ultrasound and lab work performed which demonstrated gallstones. She was given pain medication and discharged home. She presented to the emergency room earlier this afternoon complaining of increasing abdominal pain. She states that the Percocet has not given her any relief. She can't find anything to make it better or worse.She denies any NSAID use. Her last bowel movement was Friday. She denies any weight loss. She denies any melena, hematochezia, acholic stools, cramping, vaginal discharge or spotting. She denies any prior surgery  So far her pregnancy has been without complication. Past Medical History  Diagnosis Date  . Gastroesophageal reflux   . Chlamydia   . Gonorrhea   . Panic attack   . Gallstones     Past Surgical History  Procedure Laterality Date  . No past surgeries      Family History  Problem Relation Age of Onset  . Diabetes Maternal Grandmother   . Lupus Maternal Grandmother    Social History:  reports that she has never smoked. She has never used smokeless tobacco. She reports that she does not drink alcohol or use illicit drugs.  Allergies: No Known Allergies   (Not in a hospital admission)  Results for orders placed during the hospital encounter of 11/23/12 (from the past 48 hour(s))  CBC WITH DIFFERENTIAL     Status: Abnormal   Collection Time    11/23/12  2:50 PM      Result Value Range   WBC 9.0  4.0 - 10.5 K/uL   RBC  3.87  3.87 - 5.11 MIL/uL   Hemoglobin 11.4 (*) 12.0 - 15.0 g/dL   HCT 96.0 (*) 45.4 - 09.8 %   MCV 85.0  78.0 - 100.0 fL   MCH 29.5  26.0 - 34.0 pg   MCHC 34.7  30.0 - 36.0 g/dL   RDW 11.9  14.7 - 82.9 %   Platelets 225  150 - 400 K/uL   Neutrophils Relative % 76  43 - 77 %   Neutro Abs 6.8  1.7 - 7.7 K/uL   Lymphocytes Relative 15  12 - 46 %   Lymphs Abs 1.3  0.7 - 4.0 K/uL   Monocytes Relative 8  3 - 12 %   Monocytes Absolute 0.8  0.1 - 1.0 K/uL   Eosinophils Relative 1  0 - 5 %   Eosinophils Absolute 0.1  0.0 - 0.7 K/uL   Basophils Relative 0  0 - 1 %   Basophils Absolute 0.0  0.0 - 0.1 K/uL  COMPREHENSIVE METABOLIC PANEL     Status: Abnormal   Collection Time    11/23/12  2:50 PM      Result Value Range   Sodium 132 (*) 135 - 145 mEq/L   Potassium 3.4 (*) 3.5 - 5.1 mEq/L   Chloride 98  96 - 112 mEq/L   CO2 24  19 - 32 mEq/L   Glucose, Bld 78  70 - 99 mg/dL   BUN 5 (*) 6 -  23 mg/dL   Creatinine, Ser 1.61  0.50 - 1.10 mg/dL   Calcium 9.0  8.4 - 09.6 mg/dL   Total Protein 7.4  6.0 - 8.3 g/dL   Albumin 3.0 (*) 3.5 - 5.2 g/dL   AST 17  0 - 37 U/L   ALT 47 (*) 0 - 35 U/L   Alkaline Phosphatase 109  39 - 117 U/L   Total Bilirubin 0.7  0.3 - 1.2 mg/dL   GFR calc non Af Amer >90  >90 mL/min   GFR calc Af Amer >90  >90 mL/min   Comment:            The eGFR has been calculated     using the CKD EPI equation.     This calculation has not been     validated in all clinical     situations.     eGFR's persistently     <90 mL/min signify     possible Chronic Kidney Disease.  LIPASE, BLOOD     Status: None   Collection Time    11/23/12  2:50 PM      Result Value Range   Lipase 51  11 - 59 U/L   US Abdomen Complete  11/23/2012   *RADIOLOGY REPORT*  Clinical Data:  [redacted] weeks pregnant, right upper quadrant pain  COMPLETE ABDOMINAL ULTRASOUND  Comparison:  RUQ ultrasound dated 11/22/2012  Findings:  Gallbladder:  Cholelithiasis, including a 1.2 cm nonmobile gallstone in the  gallbladder neck.  Mild gallbladder wall thickening, measuring 6 mm, increased.  Positive sonographic Murphy's sign.  Common bile duct:  Dilated, measuring 9 mm.  No choledocholithiasis is seen.  Liver:  No focal lesion identified.  Within normal limits in parenchymal echogenicity.  Mild intrahepatic ductal dilatation.  IVC:  Appears normal.  Pancreas:  Poorly visualized due to overlying bowel gas.  Spleen:  Measures 7.7 cm.  Right Kidney:  Measures 13.1 cm.  Moderate right hydronephrosis.  Left Kidney:  Measures 12.1 cm.  No mass or hydronephrosis.  Abdominal aorta:  No aneurysm identified.  IMPRESSION: Cholelithiasis with gallbladder wall thickening and positive sonographic Murphy's sign, suspicious for acute cholecystitis.  Suspected mild intrahepatic/extrahepatic ductal dilatation.  Common duct measures 9 mm.  No choledocholithiasis is evident by ultrasound.  Moderate right hydronephrosis.  While the etiology is unclear by ultrasound, at least some of this may reflect extrinsic compression related to the gravid uterus.   Original Report Authenticated By: Charline Bills, M.D.   US Abdomen Limited Ruq  11/22/2012   *RADIOLOGY REPORT*  Clinical Data:  Epigastric pain.  GALLBLADDER ULTRASOUND  Comparison:  None  Findings:  Gallbladder:  There is mild gallbladder wall thickening measuring up to 3.2 cm.  Multiple stones are identified within the lumen of the gallbladder which measure up to 7 mm.  Negative sonographic Murphy's sign.  Common Bile Duct:  Within normal limits in caliber.  Liver:  No focal lesion identified.  Within normal limits in parenchymal echogenicity.  IMPRESSION:  1.  Gallstones and gallbladder wall thickening.  Cannot rule out cholecystitis.   Original Report Authenticated By: Signa Kell, M.D.    Review of Systems  Constitutional: Negative for fever, chills and weight loss.  HENT: Negative for nosebleeds and ear discharge.   Eyes: Negative for double vision.  Respiratory: Negative  for shortness of breath and wheezing.   Cardiovascular: Negative for chest pain, palpitations, leg swelling and PND.  Gastrointestinal: Positive for heartburn, nausea, vomiting and abdominal pain. Negative for  diarrhea, blood in stool and melena.  Genitourinary: Negative for dysuria, urgency, hematuria and flank pain.  Neurological: Negative for dizziness, tremors, seizures and loss of consciousness.  Psychiatric/Behavioral: Negative for substance abuse. The patient is not nervous/anxious.     Blood pressure 107/68, pulse 84, temperature 98.5 F (36.9 C), temperature source Oral, resp. rate 16, last menstrual period 07/01/2012, SpO2 98.00%. Physical Exam  Vitals reviewed. Constitutional: She is oriented to person, place, and time. She appears well-developed and well-nourished. No distress.  HENT:  Head: Normocephalic and atraumatic.  Right Ear: External ear normal.  Left Ear: External ear normal.  Eyes: Conjunctivae are normal. No scleral icterus.  Neck: Neck supple. No tracheal deviation present.  Cardiovascular: Normal rate, regular rhythm, normal heart sounds and intact distal pulses.   Respiratory: Effort normal and breath sounds normal. No respiratory distress. She has no wheezes. She has no rales.  GI: Soft. She exhibits no distension. There is tenderness in the right upper quadrant. There is positive Murphy's sign. There is no rigidity, no rebound and no guarding.  Gravid uterus slightly below level of umbilicus. RUQ TTP  Musculoskeletal: She exhibits no edema and no tenderness.  Lymphadenopathy:    She has no cervical adenopathy.  Neurological: She is alert and oriented to person, place, and time. She exhibits normal muscle tone.  Skin: Skin is warm and dry. No rash noted. She is not diaphoretic. No erythema. No pallor.  Psychiatric: She has a normal mood and affect. Her behavior is normal. Judgment and thought content normal.     Assessment/Plan 22 yo female with  -[redacted]w[redacted]d  IUP -acute cholecystitis -hyponatremia -hypokalemia  Admit to hospital for IVF, antiemetics, pain control.  IV abx for cholecystitis Will d/w partner in AM about cholecystectomy.  Recheck labs in AM DVT prophylaxis (SCDs, one dose of subcutaneous heparin tonight)  I discussed the diagnosis with the patient. I Drew a diagram detailing the pertinent anatomy. I recommended to the patient that she be admitted to the hospital for IV fluids, and IV antibiotics and probable cholecystectomy.  Pt is in her 2nd trimester so now would be best time to do cholecystectomy. If treated with medical management only, (abx), pt is at risk for recurrent acute cholecystitis. Moreover, it is possible that abx alone may not be sufficient for this presentation.   I discussed the management of the patient with her OB/GYN, Dr Antionette Char. She states pt is ok for surgery. Place a bump under pt's right side, rec checking fetal heart tones in AM and in PACU. She states she will see pt tomorrow.   Mary Sella. Andrey Campanile, MD, FACS General, Bariatric, & Minimally Invasive Surgery [facility], [place]   [facility] M 11/23/2012, 6:52 PM

## 2012-11-23 NOTE — Progress Notes (Signed)
ANTIBIOTIC CONSULT NOTE - INITIAL  Pharmacy Consult for Zosyn Indication: acute cholecystitis  No Known Allergies  Patient Measurements:     Vital Signs: Temp: 98.5 F (36.9 C) (07/07 1729) Temp src: Oral (07/07 1729) BP: 107/68 mmHg (07/07 1729) Pulse Rate: 84 (07/07 1729) Intake/Output from previous day:   Intake/Output from this shift:    Labs:  Recent Labs  11/22/12 1030 11/23/12 1450  WBC 11.5* 9.0  HGB 11.1* 11.4*  PLT 233 225  CREATININE 0.57 0.66   The CrCl is unknown because both a height and weight (above a minimum accepted value) are required for this calculation. No results found for this basename: VANCOTROUGH, VANCOPEAK, VANCORANDOM, GENTTROUGH, GENTPEAK, GENTRANDOM, TOBRATROUGH, TOBRAPEAK, TOBRARND, AMIKACINPEAK, AMIKACINTROU, AMIKACIN,  in the last 72 hours   Microbiology: No results found for this or any previous visit (from the past 720 hour(s)).  Medical History: Past Medical History  Diagnosis Date  . Gastroesophageal reflux   . Chlamydia   . Gonorrhea   . Panic attack   . Gallstones     Medications:  Scheduled:  . heparin  5,000 Units Subcutaneous Once  . pantoprazole (PROTONIX) IV  40 mg Intravenous QHS   Infusions:  . dextrose 5 % and 0.45 % NaCl with KCl 20 mEq/L 125 mL/hr at 11/23/12 1941   Assessment: 22 yo female in 2nd trimester of pregnancy presented with acute cholecystitis with plan now for possible cholecystectomy per notes to start Zosyn per pharmacy dosing  Goal of Therapy:  eradication of infection  Plan:  Zosyn 3.375g IV q8 (extended interval infusion)   Hessie Knows, PharmD, BCPS Pager [phone] 11/23/2012 8:32 PM

## 2012-11-24 ENCOUNTER — Encounter (HOSPITAL_COMMUNITY): Admission: EM | Disposition: A | Discharge status: Home / Self Care | Payer: Self-pay | Source: Home / Self Care

## 2012-11-24 ENCOUNTER — Encounter (HOSPITAL_COMMUNITY): Payer: Self-pay | Admitting: Anesthesiology

## 2012-11-24 ENCOUNTER — Inpatient Hospital Stay (HOSPITAL_COMMUNITY): Payer: Medicaid Other | Admitting: Anesthesiology

## 2012-11-24 ENCOUNTER — Encounter (HOSPITAL_COMMUNITY): Payer: Self-pay | Admitting: *Deleted

## 2012-11-24 ENCOUNTER — Inpatient Hospital Stay (HOSPITAL_COMMUNITY): Payer: Medicaid Other

## 2012-11-24 HISTORY — PX: CHOLECYSTECTOMY: SHX55

## 2012-11-24 LAB — COMPREHENSIVE METABOLIC PANEL
ALT: 46 U/L — ABNORMAL HIGH (ref 0–35)
AST: 23 U/L (ref 0–37)
Albumin: 2.7 g/dL — ABNORMAL LOW (ref 3.5–5.2)
Alkaline Phosphatase: 124 U/L — ABNORMAL HIGH (ref 39–117)
BUN: 4 mg/dL — ABNORMAL LOW (ref 6–23)
Chloride: 100 mEq/L (ref 96–112)
Potassium: 3.4 mEq/L — ABNORMAL LOW (ref 3.5–5.1)
Sodium: 132 mEq/L — ABNORMAL LOW (ref 135–145)
Total Bilirubin: 0.6 mg/dL (ref 0.3–1.2)

## 2012-11-24 LAB — CBC
MCH: 29.6 pg (ref 26.0–34.0)
MCHC: 34.6 g/dL (ref 30.0–36.0)
MCV: 85.6 fL (ref 78.0–100.0)
Platelets: 235 10*3/uL (ref 150–400)
RDW: 13.7 % (ref 11.5–15.5)
WBC: 7.4 10*3/uL (ref 4.0–10.5)

## 2012-11-24 SURGERY — LAPAROSCOPIC CHOLECYSTECTOMY WITH INTRAOPERATIVE CHOLANGIOGRAM
Anesthesia: General | Site: Abdomen | Wound class: Clean Contaminated

## 2012-11-24 MED ORDER — FENTANYL CITRATE 0.05 MG/ML IJ SOLN
INTRAMUSCULAR | Status: DC | PRN
  Administered 2012-11-24 (×5): 50 ug via INTRAVENOUS

## 2012-11-24 MED ORDER — GLYCOPYRROLATE 0.2 MG/ML IJ SOLN
INTRAMUSCULAR | Status: DC | PRN
  Administered 2012-11-24: .3 mg via INTRAVENOUS

## 2012-11-24 MED ORDER — PROMETHAZINE HCL 25 MG/ML IJ SOLN: 6.2500 mg | INTRAMUSCULAR | Status: DC | PRN

## 2012-11-24 MED ORDER — IOHEXOL 300 MG/ML  SOLN
INTRAMUSCULAR | Status: DC | PRN
  Administered 2012-11-24: 6 mL

## 2012-11-24 MED ORDER — PIPERACILLIN SOD-TAZOBACTAM SO 3.375 (3-0.375) G IV SOLR
INTRAVENOUS | Status: DC | PRN
  Administered 2012-11-24: 3.375 g via INTRAMUSCULAR

## 2012-11-24 MED ORDER — PROPOFOL 10 MG/ML IV BOLUS
INTRAVENOUS | Status: DC | PRN
  Administered 2012-11-24: 25 mg via INTRAVENOUS
  Administered 2012-11-24: 175 mg via INTRAVENOUS

## 2012-11-24 MED ORDER — LACTATED RINGERS IV SOLN
INTRAVENOUS | Status: DC | PRN
  Administered 2012-11-24: 1000 mL via INTRAVENOUS

## 2012-11-24 MED ORDER — LACTATED RINGERS IV SOLN
INTRAVENOUS | Status: DC | PRN
  Administered 2012-11-24 (×2): via INTRAVENOUS

## 2012-11-24 MED ORDER — NEOSTIGMINE METHYLSULFATE 1 MG/ML IJ SOLN
INTRAMUSCULAR | Status: DC | PRN
  Administered 2012-11-24: 1 mg via INTRAVENOUS

## 2012-11-24 MED ORDER — LACTATED RINGERS IV SOLN: INTRAVENOUS | Status: DC

## 2012-11-24 MED ORDER — FENTANYL CITRATE 0.05 MG/ML IJ SOLN
25.0000 ug | INTRAMUSCULAR | Status: DC | PRN
  Administered 2012-11-24 (×2): 50 ug via INTRAVENOUS

## 2012-11-24 MED ORDER — BUPIVACAINE-EPINEPHRINE 0.25% -1:200000 IJ SOLN
INTRAMUSCULAR | Status: DC | PRN
  Administered 2012-11-24: 17 mL

## 2012-11-24 MED ORDER — CISATRACURIUM BESYLATE (PF) 10 MG/5ML IV SOLN
INTRAVENOUS | Status: DC | PRN
  Administered 2012-11-24: 6 mg via INTRAVENOUS

## 2012-11-24 MED ORDER — KCL IN DEXTROSE-NACL 20-5-0.45 MEQ/L-%-% IV SOLN
INTRAVENOUS | Status: DC
  Administered 2012-11-24 – 2012-11-25 (×2): via INTRAVENOUS
  Filled 2012-11-24 (×3): qty 1000

## 2012-11-24 MED ORDER — OXYCODONE-ACETAMINOPHEN 5-325 MG PO TABS
1.0000 | ORAL_TABLET | ORAL | Status: DC | PRN
  Administered 2012-11-24 – 2012-11-25 (×3): 1 via ORAL
  Filled 2012-11-24 (×3): qty 1

## 2012-11-24 MED ORDER — SUCCINYLCHOLINE CHLORIDE 20 MG/ML IJ SOLN
INTRAMUSCULAR | Status: DC | PRN
  Administered 2012-11-24: 100 mg via INTRAVENOUS

## 2012-11-24 SURGICAL SUPPLY — 47 items
ADH SKN CLS APL DERMABOND .7 (GAUZE/BANDAGES/DRESSINGS) ×1
APL SKNCLS STERI-STRIP NONHPOA (GAUZE/BANDAGES/DRESSINGS)
APPLIER CLIP ROT 10 11.4 M/L (STAPLE) ×2
APR CLP MED LRG 11.4X10 (STAPLE) ×1
BAG SPEC RTRVL LRG 6X4 10 (ENDOMECHANICALS) ×1
BENZOIN TINCTURE PRP APPL 2/3 (GAUZE/BANDAGES/DRESSINGS) IMPLANT
CANISTER SUCTION 2500CC (MISCELLANEOUS) ×2 IMPLANT
CATH REDDICK CHOLANGI 4FR 50CM (CATHETERS) IMPLANT
CHLORAPREP W/TINT 26ML (MISCELLANEOUS) ×2 IMPLANT
CLIP APPLIE ROT 10 11.4 M/L (STAPLE) ×1 IMPLANT
CLOTH BEACON ORANGE TIMEOUT ST (SAFETY) ×2 IMPLANT
COVER MAYO STAND STRL (DRAPES) ×2 IMPLANT
DECANTER SPIKE VIAL GLASS SM (MISCELLANEOUS) ×1 IMPLANT
DERMABOND ADVANCED (GAUZE/BANDAGES/DRESSINGS) ×1
DERMABOND ADVANCED .7 DNX12 (GAUZE/BANDAGES/DRESSINGS) IMPLANT
DRAPE C-ARM 42X120 X-RAY (DRAPES) ×2 IMPLANT
DRAPE LAPAROSCOPIC ABDOMINAL (DRAPES) ×2 IMPLANT
ELECT REM PT RETURN 9FT ADLT (ELECTROSURGICAL) ×2
ELECTRODE REM PT RTRN 9FT ADLT (ELECTROSURGICAL) ×1 IMPLANT
GLOVE BIOGEL PI IND STRL 7.0 (GLOVE) ×1 IMPLANT
GLOVE BIOGEL PI INDICATOR 7.0 (GLOVE) ×1
GLOVE SS BIOGEL STRL SZ 7.5 (GLOVE) ×1 IMPLANT
GLOVE SUPERSENSE BIOGEL SZ 7.5 (GLOVE) ×1
GOWN STRL NON-REIN LRG LVL3 (GOWN DISPOSABLE) ×2 IMPLANT
GOWN STRL REIN 3XL LVL4 (GOWN DISPOSABLE) ×1 IMPLANT
GOWN STRL REIN XL XLG (GOWN DISPOSABLE) ×4 IMPLANT
HEMOSTAT SURGICEL 4X8 (HEMOSTASIS) IMPLANT
IV CATH 14GX2 1/4 (CATHETERS) IMPLANT
IV SET EXT 30 76VOL 4 MALE LL (IV SETS) IMPLANT
KIT BASIN OR (CUSTOM PROCEDURE TRAY) ×2 IMPLANT
NS IRRIG 1000ML POUR BTL (IV SOLUTION) IMPLANT
POUCH SPECIMEN RETRIEVAL 10MM (ENDOMECHANICALS) ×1 IMPLANT
SET CHOLANGIOGRAPH MIX (MISCELLANEOUS) ×2 IMPLANT
SET IRRIG TUBING LAPAROSCOPIC (IRRIGATION / IRRIGATOR) ×2 IMPLANT
SLEEVE XCEL OPT CAN 5 100 (ENDOMECHANICALS) ×1 IMPLANT
SOLUTION ANTI FOG 6CC (MISCELLANEOUS) ×2 IMPLANT
STOPCOCK K 69 2C6206 (IV SETS) IMPLANT
STRIP CLOSURE SKIN 1/2X4 (GAUZE/BANDAGES/DRESSINGS) IMPLANT
SUT MNCRL AB 4-0 PS2 18 (SUTURE) ×2 IMPLANT
TOWEL OR 17X26 10 PK STRL BLUE (TOWEL DISPOSABLE) ×2 IMPLANT
TRAY LAP CHOLE (CUSTOM PROCEDURE TRAY) ×2 IMPLANT
TROCAR SLEEVE XCEL 5X75 (ENDOMECHANICALS) ×1 IMPLANT
TROCAR XCEL BLADELESS 5X75MML (TROCAR) ×1 IMPLANT
TROCAR XCEL BLUNT TIP 100MML (ENDOMECHANICALS) ×2 IMPLANT
TROCAR XCEL NON-BLD 11X100MML (ENDOMECHANICALS) ×2 IMPLANT
TROCAR Z-THREAD FIOS 5X100MM (TROCAR) ×2 IMPLANT
TUBING INSUFFLATION 10FT LAP (TUBING) ×2 IMPLANT

## 2012-11-24 NOTE — Op Note (Signed)
Preoperative diagnosis: Cholelithiasis and acute cholecystitis  Postoperative diagnosis: Cholelithiasis and acute cholecystitis  Surgical procedure: Laparoscopic cholecystectomy with intraoperative cholangiogram  Surgeon: Sharlet Salina T. Muneeb Veras M.D.  Assistant: None  Anesthesia: General Endotracheal  Complications: None  Estimated blood loss: Minimal  Description of procedure: The patient brought to the operating room, placed in the supine position on the operating table, and general endotracheal anesthesia induced. She was positioned with a bump under her right side to displace the uterus. The abdomen was widely sterilely prepped and draped. The patient had received preoperative IV antibiotics and PAS were in place. Patient timeout was performed the correct procedure verified. Standard 4 port technique was used with an open Hassan cannula above the umbilicus cephalad to the palpable uterus and the remainder of the ports placed under direct vision. The gallbladder was visualized. It appeared markedly thickened and edematous. The fundus was grasped and elevated up over the liver and the infundibulum retracted inferiolaterally. Peritoneum anterior and posterior to close triangle was incised and fibrofatty tissue stripped off the neck of the gallbladder toward the porta hepatis. The distal gallbladder was thoroughly dissected. The cystic artery was identified in close triangle and the cystic duct gallbladder junction dissected 360.  A good critical view was obtained. When the anatomy was clear the cystic duct was clipped at the gallbladder junction and an operative cholangiogram obtained through the cystic duct. This showed good filling of a normal common bile duct and intrahepatic ducts with free flow into the duodenum and no filling defects. Following this the Cholangiocath was removed and the cystic duct was doubly clipped proximally and divided. The cystic artery was doubly clipped proximally and  distally and divided. The gallbladder was dissected free from its bed using hook cautery and removed through the umbilical port site. Complete hemostasis was obtained in the gallbladder bed. The right upper quadrant was thoroughly irrigated and hemostasis assured. Trochars were removed and all CO2 evacuated and the [facility] trocar site fascial defect closed. Skin incisions were closed with subcuticular Monocryl and Dermabond. Sponge needle and instrument counts were correct. The patient was taken to PACU in good condition.  Hymie Gorr T  11/24/2012

## 2012-11-24 NOTE — Progress Notes (Signed)
Pt in pacu. fhr is 132bpm by doppler.

## 2012-11-24 NOTE — Care Management Note (Signed)
    Page 1 of 1   11/24/2012     10:51:51 AM   CARE MANAGEMENT NOTE 11/24/2012  Patient:  Annette Wood,Annette Wood   Account Number:  [id number]  Date Initiated:  11/24/2012  Documentation initiated by:  Lorenda Ishihara  Subjective/Objective Assessment:   22 yo female admitted with acute cholecystitis, plan for surgery 7/8. PTA lived at home with family.     Action/Plan:   home when stable   Anticipated DC Date:  11/26/2012   Anticipated DC Plan:  HOME/SELF CARE      DC Planning Services  CM consult      Choice offered to / List presented to:             Status of service:  Completed, signed off Medicare Important Message given?   (If response is "NO", the following Medicare IM given date fields will be blank) Date Medicare IM given:   Date Additional Medicare IM given:    Discharge Disposition:  HOME/SELF CARE  Per UR Regulation:  Reviewed for med. necessity/level of care/duration of stay  If discussed at Long Length of Stay Meetings, dates discussed:    Comments:

## 2012-11-24 NOTE — Progress Notes (Signed)
Patient ID: Annette Wood, female   DOB: Jul 26, 1990, 22 y.o.   MRN: [id number] Patient continues to have epigastric and right upper quadrant pain requiring IV medications this morning. On examination she has significant epigastric and right upper quadrant tenderness. I have recommended proceeding with laparoscopic cholecystectomy with cholangiogram for cholelithiasis and early acute cholecystitis or persistent biliary colic. I discussed options of nonoperative management with the patient but has her pain is persistent over several days I do not feel that this would be the best course.I discussed the procedure in detail.   We discussed the risks and benefits of a laparoscopic cholecystectomy and possible cholangiogram including, but not limited to bleeding, infection, injury to surrounding structures such as the intestine or liver, bile leak, retained gallstones, need to convert to an open procedure, prolonged diarrhea, blood clots such as  DVT, common bile duct injury, anesthesia risks, and possible need for additional procedures.  The likelihood of improvement in symptoms and return to the patient's normal status is good. We discussed the typical post-operative recovery course. We discussed that there is some risk of fetal loss but that she would have a risk as well with ongoing acute cholecystitis. We have discussed the procedure with her obstetrician who is in agreement with proceeding with cholecystectomy.  Mariella Saa MD, FACS  11/24/2012, 10:06 AM

## 2012-11-24 NOTE — Progress Notes (Signed)
Fetal heart tones assessed.  HR-160  Annette Wood L

## 2012-11-24 NOTE — Progress Notes (Signed)
Patient ID: Annette Wood, female   DOB: April 07, 1991, 22 y.o.   MRN: [id number]    Subjective: No significant changes today, still with pain, NPO  Objective: Vital signs in last 24 hours: Temp:  [98 F (36.7 C)-98.6 F (37 C)] 98 F (36.7 C) (07/08 0516) Pulse Rate:  [78-96] 78 (07/08 0516) Resp:  [16-18] 16 (07/08 0516) BP: (100-115)/(61-76) 108/74 mmHg (07/08 0516) SpO2:  [98 %-100 %] 99 % (07/08 0516) Weight:  [181 lb 3.5 oz (82.2 kg)] 181 lb 3.5 oz (82.2 kg) (07/07 2020)    Intake/Output from previous day: 07/07 0701 - 07/08 0700 In: 1389.6 [I.V.:1289.6; IV Piggyback:100] Out: 500 [Urine:500] Intake/Output this shift:    PE: Abd: soft, tender in RUQ, +BS General: NAD   Lab Results:   Recent Labs  11/23/12 1450 11/24/12 0404  WBC 9.0 7.4  HGB 11.4* 10.3*  HCT 32.9* 29.8*  PLT 225 235   BMET  Recent Labs  11/23/12 1450 11/24/12 0404  NA 132* 132*  K 3.4* 3.4*  CL 98 100  CO2 24 25  GLUCOSE 78 87  BUN 5* 4*  CREATININE 0.66 0.64  CALCIUM 9.0 8.6   PT/INR No results found for this basename: LABPROT, INR,  in the last 72 hours CMP     Component Value Date/Time   NA 132* 11/24/2012 0404   K 3.4* 11/24/2012 0404   CL 100 11/24/2012 0404   CO2 25 11/24/2012 0404   GLUCOSE 87 11/24/2012 0404   BUN 4* 11/24/2012 0404   CREATININE 0.64 11/24/2012 0404   CALCIUM 8.6 11/24/2012 0404   PROT 6.5 11/24/2012 0404   ALBUMIN 2.7* 11/24/2012 0404   AST 23 11/24/2012 0404   ALT 46* 11/24/2012 0404   ALKPHOS 124* 11/24/2012 0404   BILITOT 0.6 11/24/2012 0404   GFRNONAA >90 11/24/2012 0404   GFRAA >90 11/24/2012 0404   Lipase     Component Value Date/Time   LIPASE 51 11/23/2012 1450       Studies/Results: US Abdomen Complete  11/23/2012   *RADIOLOGY REPORT*  Clinical Data:  [redacted] weeks pregnant, right upper quadrant pain  COMPLETE ABDOMINAL ULTRASOUND  Comparison:  RUQ ultrasound dated 11/22/2012  Findings:  Gallbladder:  Cholelithiasis, including a 1.2 cm nonmobile gallstone in the  gallbladder neck.  Mild gallbladder wall thickening, measuring 6 mm, increased.  Positive sonographic Murphy's sign.  Common bile duct:  Dilated, measuring 9 mm.  No choledocholithiasis is seen.  Liver:  No focal lesion identified.  Within normal limits in parenchymal echogenicity.  Mild intrahepatic ductal dilatation.  IVC:  Appears normal.  Pancreas:  Poorly visualized due to overlying bowel gas.  Spleen:  Measures 7.7 cm.  Right Kidney:  Measures 13.1 cm.  Moderate right hydronephrosis.  Left Kidney:  Measures 12.1 cm.  No mass or hydronephrosis.  Abdominal aorta:  No aneurysm identified.  IMPRESSION: Cholelithiasis with gallbladder wall thickening and positive sonographic Murphy's sign, suspicious for acute cholecystitis.  Suspected mild intrahepatic/extrahepatic ductal dilatation.  Common duct measures 9 mm.  No choledocholithiasis is evident by ultrasound.  Moderate right hydronephrosis.  While the etiology is unclear by ultrasound, at least some of this may reflect extrinsic compression related to the gravid uterus.   Original Report Authenticated By: Charline Bills, M.D.   US Abdomen Limited Ruq  11/22/2012   *RADIOLOGY REPORT*  Clinical Data:  Epigastric pain.  GALLBLADDER ULTRASOUND  Comparison:  None  Findings:  Gallbladder:  There is mild gallbladder wall thickening measuring up  to 3.2 cm.  Multiple stones are identified within the lumen of the gallbladder which measure up to 7 mm.  Negative sonographic Murphy's sign.  Common Bile Duct:  Within normal limits in caliber.  Liver:  No focal lesion identified.  Within normal limits in parenchymal echogenicity.  IMPRESSION:  1.  Gallstones and gallbladder wall thickening.  Cannot rule out cholecystitis.   Original Report Authenticated By: Signa Kell, M.D.    Anti-infectives: Anti-infectives   Start     Dose/Rate Route Frequency Ordered Stop   11/23/12 2200  piperacillin-tazobactam (ZOSYN) IVPB 3.375 g     3.375 g 12.5 mL/hr over 240 Minutes  Intravenous Every 8 hours 11/23/12 2033         Assessment/Plan Acute cholecystitis: will need lap chole today, NPO, RBA discussed with patient, Dr. Johna Sheriff to see later this am, plan OR later this AM,   LOS: 1 day    WHITE, ELIZABETH 11/24/2012

## 2012-11-24 NOTE — Progress Notes (Signed)
Hospital Day: 2  S: Preterm labor symptoms: none  O: Blood pressure 119/75, pulse 88, temperature 98.6 F (37 C), temperature source Oral, resp. rate 18, height 5\' 3"  (1.6 m), weight 181 lb 3.5 oz (82.2 kg), last menstrual period 07/01/2012, SpO2 100.00%.   FHT:FHR 130s  A/P- 22 y.o. admitted with:  Present on Admission:  Acute cholecystitis now s/p cholecystectomy  Pregnancy Complications: see above  Preterm labor management: no treatment necessary Dating:  [redacted]w[redacted]d FWB:  FHR in range PTL:  none Continue to doppler FHR qam while hospitalzed

## 2012-11-24 NOTE — Anesthesia Postprocedure Evaluation (Signed)
Anesthesia Post Note  Patient: Annette Wood  Procedure(s) Performed: Procedure(s) (LRB): LAPAROSCOPIC CHOLECYSTECTOMY WITH INTRAOPERATIVE CHOLANGIOGRAM (N/A)  Anesthesia type: General  Patient location: PACU  Post pain: Pain level controlled  Post assessment: Post-op Vital signs reviewed  Last Vitals:  Filed Vitals:   11/24/12 1315  BP: 113/53  Pulse: 116  Temp: 36.8 C  Resp: 25    Post vital signs: Reviewed  Level of consciousness: sedated  Complications: No apparent anesthesia complications

## 2012-11-24 NOTE — Transfer of Care (Signed)
Immediate Anesthesia Transfer of Care Note  Patient: Annette Wood  Procedure(s) Performed: Procedure(s): LAPAROSCOPIC CHOLECYSTECTOMY WITH INTRAOPERATIVE CHOLANGIOGRAM (N/A)  Patient Location: PACU  Anesthesia Type:General  Level of Consciousness: awake, oriented, patient cooperative, lethargic and responds to stimulation  Airway & Oxygen Therapy: Patient Spontanous Breathing and Patient connected to face mask oxygen  Post-op Assessment: Report given to PACU RN, Post -op Vital signs reviewed and stable and Patient moving all extremities  Post vital signs: Reviewed and stable  Complications: No apparent anesthesia complications

## 2012-11-24 NOTE — Anesthesia Preprocedure Evaluation (Addendum)
Anesthesia Evaluation  Patient identified by MRN, date of birth, ID band Patient awake    Reviewed: Allergy & Precautions, H&P , NPO status , Patient's Chart, lab work & pertinent test results  Airway Mallampati: II TM Distance: >3 FB Neck ROM: Full    Dental  (+) Teeth Intact and Dental Advisory Given   Pulmonary neg pulmonary ROS,  breath sounds clear to auscultation  Pulmonary exam normal       Cardiovascular negative cardio ROS  Rhythm:Regular Rate:Normal     Neuro/Psych Anxiety negative neurological ROS  negative psych ROS   GI/Hepatic Neg liver ROS, GERD-  ,  Endo/Other  negative endocrine ROS  Renal/GU negative Renal ROS  negative genitourinary   Musculoskeletal negative musculoskeletal ROS (+)   Abdominal   Peds  Hematology negative hematology ROS (+)   Anesthesia Other Findings   Reproductive/Obstetrics (+) Pregnancy (Patient at [redacted] weeks gestation)                          Anesthesia Physical Anesthesia Plan  ASA: II and emergent  Anesthesia Plan: General   Post-op Pain Management:    Induction: Intravenous, Rapid sequence and Cricoid pressure planned  Airway Management Planned: Oral ETT  Additional Equipment:   Intra-op Plan:   Post-operative Plan: Extubation in OR  Informed Consent: I have reviewed the patients History and Physical, chart, labs and discussed the procedure including the risks, benefits and alternatives for the proposed anesthesia with the patient or authorized representative who has indicated his/her understanding and acceptance.   Dental advisory given  Plan Discussed with: CRNA  Anesthesia Plan Comments:         Anesthesia Quick Evaluation

## 2012-11-24 NOTE — Progress Notes (Signed)
Patient interviewed and examined, agree with PA note above. See separate note  Mariella Saa MD, FACS  11/24/2012 11:03 AM

## 2012-11-24 NOTE — Preoperative (Signed)
Beta Blockers   Reason not to administer Beta Blockers:Not Applicable 

## 2012-11-25 ENCOUNTER — Encounter (HOSPITAL_COMMUNITY): Payer: Self-pay | Admitting: General Surgery

## 2012-11-25 DIAGNOSIS — K8000 Calculus of gallbladder with acute cholecystitis without obstruction: Secondary | ICD-10-CM | POA: Diagnosis present

## 2012-11-25 DIAGNOSIS — Z331 Pregnant state, incidental: Secondary | ICD-10-CM

## 2012-11-25 LAB — COMPREHENSIVE METABOLIC PANEL
ALT: 40 U/L — ABNORMAL HIGH (ref 0–35)
BUN: <3 mg/dL — ABNORMAL LOW (ref 6–23)
CO2: 25 mEq/L (ref 19–32)
Calcium: 8.7 mg/dL (ref 8.4–10.5)
Creatinine, Ser: 0.63 mg/dL (ref 0.50–1.10)
GFR calc Af Amer: >90 mL/min (ref >90)
GFR calc non Af Amer: >90 mL/min (ref >90)
Glucose, Bld: 87 mg/dL (ref 70–99)
Total Protein: 6.9 g/dL (ref 6.0–8.3)

## 2012-11-25 MED ORDER — OXYCODONE-ACETAMINOPHEN 5-325 MG PO TABS: 1.0000 | ORAL_TABLET | ORAL | Status: DC | PRN

## 2012-11-25 MED ORDER — ACETAMINOPHEN 325 MG PO TABS: 650.0000 mg | ORAL_TABLET | Freq: Four times a day (QID) | ORAL | Status: DC | PRN

## 2012-11-25 MED ORDER — ACETAMINOPHEN 325 MG PO TABS: ORAL_TABLET | ORAL | Status: DC

## 2012-11-25 NOTE — Progress Notes (Signed)
Patient interviewed and examined, agree with PA note above.  Mariella Saa MD, FACS  11/25/2012 4:26 PM

## 2012-11-25 NOTE — Progress Notes (Addendum)
1 Day Post-Op  Subjective: Still very sore, hasn't eaten breakfast yet.  Hasn't been up much yet.  Family in room with her. Says she feels a little better. Objective: Vital signs in last 24 hours: Temp:  [97.8 F (36.6 C)-100 F (37.8 C)] 97.9 F (36.6 C) (07/09 0534) Pulse Rate:  [80-116] 88 (07/09 0534) Resp:  [18-28] 20 (07/09 0534) BP: (94-122)/(53-78) 94/61 mmHg (07/09 0534) SpO2:  [97 %-100 %] 97 % (07/09 0534)   600PO recorded yesterday. Tm 100.3, afebrile now, VSS No labs  Intake/Output from previous day: 07/08 0701 - 07/09 0700 In: 3508.8 [P.O.:600; I.V.:2908.8] Out: 2250 [Urine:2250] Intake/Output this shift:    General appearance: alert, cooperative and no distress Resp: clear to auscultation bilaterally GI: soft, tender, BS+ incisions look fine.  Lab Results:   Recent Labs  11/23/12 1450 11/24/12 0404  WBC 9.0 7.4  HGB 11.4* 10.3*  HCT 32.9* 29.8*  PLT 225 235    BMET  Recent Labs  11/23/12 1450 11/24/12 0404  NA 132* 132*  K 3.4* 3.4*  CL 98 100  CO2 24 25  GLUCOSE 78 87  BUN 5* 4*  CREATININE 0.66 0.64  CALCIUM 9.0 8.6   PT/INR No results found for this basename: LABPROT, INR,  in the last 72 hours   Recent Labs Lab 11/22/12 1030 11/23/12 1450 11/24/12 0404  AST 27 17 23   ALT 69* 47* 46*  ALKPHOS 110 109 124*  BILITOT 0.3 0.7 0.6  PROT 7.0 7.4 6.5  ALBUMIN 3.1* 3.0* 2.7*     Lipase     Component Value Date/Time   LIPASE 51 11/23/2012 1450     Studies/Results: Dg Cholangiogram Operative  11/24/2012   *RADIOLOGY REPORT*  Clinical Data: Gallstones, cholecystitis  INTRAOPERATIVE CHOLANGIOGRAM  Technique:  Multiple fluoroscopic spot radiographs were obtained during intraoperative cholangiogram and are submitted for interpretation post-operatively.  Comparison: Abdominal ultrasound - 11/23/2012  Fluoroscopy time:  12 seconds  Findings:  Intraoperative angiographic images of the right upper abdominal quadrant during laparoscopic  cholecystectomy are provided for review.  Surgical clips overlie the expected location of the gallbladder fossa.  Contrast injection demonstrates selective cannulation of the central aspect of the cystic duct.  There is passage of contrast through the central aspect of the cystic duct with filling of a mildly dilated common bile duct.  There is passage of contrast though the CBD and into the descending portion of the duodenum.  There is a possible eccentric filling defect in the distal aspect of the common bile duct regional to the sphincter of Oddi, possibly accentuated due to underdistension.  There is minimal reflux of injected contrast into the common hepatic duct and central aspect of the nondilated intrahepatic biliary system.  IMPRESSION:  Intraoperative cholangiogram as above.  There is a possible eccentric filling defect within the distal aspect of the common bile duct regional to the sphincter of Oddi, possibly artifactual secondary to projection and under distension, though a distal nonobstructing gallstone is not excluded.  Further evaluation may be performed with MRCP as clinically indicated.   Original Report Authenticated By: Tacey Ruiz, MD   US Abdomen Complete  11/23/2012   *RADIOLOGY REPORT*  Clinical Data:  [redacted] weeks pregnant, right upper quadrant pain  COMPLETE ABDOMINAL ULTRASOUND  Comparison:  RUQ ultrasound dated 11/22/2012  Findings:  Gallbladder:  Cholelithiasis, including a 1.2 cm nonmobile gallstone in the gallbladder neck.  Mild gallbladder wall thickening, measuring 6 mm, increased.  Positive sonographic Murphy's sign.  Common bile duct:  Dilated, measuring 9 mm.  No choledocholithiasis is seen.  Liver:  No focal lesion identified.  Within normal limits in parenchymal echogenicity.  Mild intrahepatic ductal dilatation.  IVC:  Appears normal.  Pancreas:  Poorly visualized due to overlying bowel gas.  Spleen:  Measures 7.7 cm.  Right Kidney:  Measures 13.1 cm.  Moderate right  hydronephrosis.  Left Kidney:  Measures 12.1 cm.  No mass or hydronephrosis.  Abdominal aorta:  No aneurysm identified.  IMPRESSION: Cholelithiasis with gallbladder wall thickening and positive sonographic Murphy's sign, suspicious for acute cholecystitis.  Suspected mild intrahepatic/extrahepatic ductal dilatation.  Common duct measures 9 mm.  No choledocholithiasis is evident by ultrasound.  Moderate right hydronephrosis.  While the etiology is unclear by ultrasound, at least some of this may reflect extrinsic compression related to the gravid uterus.   Original Report Authenticated By: Charline Bills, M.D.    Medications: . pantoprazole (PROTONIX) IV  40 mg Intravenous QHS    Assessment/Plan Cholelithiasis and acute cholecystitis s/pLaparoscopic cholecystectomy with intraoperative cholangiogram, 11/24/2012, Mariella Saa, MD. Single IUP at [redacted]w[redacted]d    Plan:  Concern with IOC, I will recheck labs, mobilize, advance diet and hope to send her home later today.  LFT's remain normal post op    Recent Labs Lab 11/22/12 1030 11/23/12 1450 11/24/12 0404 11/25/12 0920  AST 27 17 23 29   ALT 69* 47* 46* 40*  ALKPHOS 110 109 124* 117  BILITOT 0.3 0.7 0.6 0.5  PROT 7.0 7.4 6.5 6.9  ALBUMIN 3.1* 3.0* 2.7* 2.6*    she has been up like morphine better than percocet.  I told her she could have tylenol or percocet only.  She wants to go home.  Still taking liquids, says she's not hungry.  Plan D/c this afternoon.  2:30 PM  LOS: 2 days    Jalesa Thien 11/25/2012

## 2012-11-25 NOTE — Progress Notes (Signed)
Patient interviewed and examined, agree with PA note above. I think her cholangiogram is likely negative.  She feels better today. As a precaution will recheck LFTs.  Likely home later today if lab OK.  Mariella Saa MD, FACS  11/25/2012 11:22 AM

## 2012-11-25 NOTE — Discharge Summary (Signed)
Physician Discharge Summary  Patient ID: Annette Wood MRN: [id number] DOB/AGE: 1991/03/22 22 y.o.  Admit date: 11/23/2012 Discharge date: 11/25/2012  Admission Diagnoses: Cholelithiasis and acute cholecystitis  Single IUP at [redacted]w[redacted]d     Discharge Diagnoses: Same Active Problems:   Cholelithiasis and acute cholecystitis without obstruction   IUP (intrauterine pregnancy), incidental   PROCEDURES: s/pLaparoscopic cholecystectomy with intraoperative cholangiogram, 11/24/2012, Mariella Saa, MD.   Hospital Course: 22 year old G3 P2 at [redacted]w[redacted]d IUP presents to the emergency room with persistent epigastric and right Upper quadrant pain. Patient presented to [facility] emergency room yesterday (7/6) complaining of awaking at 2 AM with sharp epigastric pain associated with nausea and vomiting.Patient states that she initially developed some discomfort in her upper abdomen on Saturday but thought it was probably due to reflux. She had taken TUMS without any relief. She had an ultrasound and lab work performed which demonstrated gallstones. She was given pain medication and discharged home. She presented to the emergency room earlier this afternoon complaining of increasing abdominal pain. She states that the Percocet has not given her any relief. She can't find anything to make it better or worse.She denies any NSAID use. Her last bowel movement was Friday. She denies any weight loss. She denies any melena, hematochezia, acholic stools, cramping, vaginal discharge or spotting. She denies any prior surgery. She was admitted and after evaluation, was taken to the OR by Dr. Johna Sheriff.  She had the above procedure.  She tolerated the procedure well.  IOC showed a possible stone, repeat LFT's were normal.  She was ready for D/c after lunch.  She will follow up with DR. Antionette Char, post op and with any issues related to her pregnancy.  Dr. Johna Sheriff will see in 2-3 weeks in the  clinic.  Condition on d/c:  Improved.    Disposition: 01-Home or Self Care       Future Appointments Provider Department Dept Phone   12/17/2012 4:00 PM Antionette Char, MD [facility] [phone]       Medication List         acetaminophen 325 MG tablet  Commonly known as:  TYLENOL  Do not take more than 4000 mg of acetaminophen (Tylenol) in any 24 hour period.  There is tylenol in your prescribed pain medicine.  You have to count it.     calcium carbonate 500 MG chewable tablet  Commonly known as:  TUMS - dosed in mg elemental calcium  Chew 1 tablet by mouth daily as needed for heartburn.     oxyCODONE-acetaminophen 5-325 MG per tablet  Commonly known as:  PERCOCET/ROXICET  Take 1 tablet by mouth every 4 (four) hours as needed for pain.     oxyCODONE-acetaminophen 5-325 MG per tablet  Commonly known as:  ROXICET  Take 1-2 tablets by mouth every 4 (four) hours as needed for pain.     pantoprazole 40 MG tablet  Commonly known as:  PROTONIX  Take 1 tablet (40 mg total) by mouth daily.       Follow-up Information   Follow up with HOXWORTH,BENJAMIN T, MD. Schedule an appointment as soon as possible for a visit in 2 weeks. (Call for a follow up appointment in 2-3 weeks.)    Contact information:   [address] [place] [address] [phone]       Schedule an appointment as soon as possible for a visit with Roseanna Rainbow, MD. (Call and see when she wants you to come  back for follow up.)    Contact information:   [address] [place] [address] [phone]       Follow up with prescription. (I gave you another prescription for the pain medicine.  You already have some at home.  You do not need to refill the prescription if you have enough of your original prescription at home already. )       Signed: Sherrie George 11/25/2012, 2:37 PM

## 2012-11-26 ENCOUNTER — Encounter: Payer: Self-pay | Admitting: Obstetrics & Gynecology

## 2012-11-26 ENCOUNTER — Ambulatory Visit (INDEPENDENT_AMBULATORY_CARE_PROVIDER_SITE_OTHER): Payer: Medicaid Other | Admitting: Obstetrics & Gynecology

## 2012-11-26 VITALS — BP 108/74 | Temp 98.5°F | Wt 182.2 lb

## 2012-11-26 DIAGNOSIS — Z3480 Encounter for supervision of other normal pregnancy, unspecified trimester: Secondary | ICD-10-CM

## 2012-11-26 DIAGNOSIS — K8000 Calculus of gallbladder with acute cholecystitis without obstruction: Secondary | ICD-10-CM

## 2012-11-26 DIAGNOSIS — Z3482 Encounter for supervision of other normal pregnancy, second trimester: Secondary | ICD-10-CM

## 2012-11-26 LAB — POCT URINALYSIS DIPSTICK
Bilirubin, UA: NEGATIVE
Blood, UA: NEGATIVE
Glucose, UA: NEGATIVE
Nitrite, UA: NEGATIVE
Urobilinogen, UA: NEGATIVE

## 2012-11-26 LAB — MRSA CULTURE

## 2012-11-26 NOTE — Progress Notes (Signed)
Pulse-74 No complaints.

## 2012-11-26 NOTE — Patient Instructions (Addendum)
Tetanus, Diphtheria, Pertussis (Tdap) Vaccine What You Need to Know WHY GET VACCINATED? Tetanus, diphtheria and pertussis can be very serious diseases, even for adolescents and adults. Tdap vaccine can protect us from these diseases. TETANUS (Lockjaw) causes painful muscle tightening and stiffness, usually all over the body.  It can lead to tightening of muscles in the head and neck so you can't open your mouth, swallow, or sometimes even breathe. Tetanus kills about 1 out of 5 people who are infected. DIPHTHERIA can cause a thick coating to form in the back of the throat.  It can lead to breathing problems, paralysis, heart failure, and death. PERTUSSIS (Whooping Cough) causes severe coughing spells, which can cause difficulty breathing, vomiting and disturbed sleep.  It can also lead to weight loss, incontinence, and rib fractures. Up to 2 in 100 adolescents and 5 in 100 adults with pertussis are hospitalized or have complications, which could include pneumonia and death. These diseases are caused by bacteria. Diphtheria and pertussis are spread from person to person through coughing or sneezing. Tetanus enters the body through cuts, scratches, or wounds. Before vaccines, the [place] saw as many as 200,000 cases a year of diphtheria and pertussis, and hundreds of cases of tetanus. Since vaccination began, tetanus and diphtheria have dropped by about 99% and pertussis by about 80%. TDAP VACCINE Tdap vaccine can protect adolescents and adults from tetanus, diphtheria, and pertussis. One dose of Tdap is routinely given at age 11 or 12. People who did not get Tdap at that age should get it as soon as possible. Tdap is especially important for health care professionals and anyone having close contact with a baby younger than 12 months. Pregnant women should get a dose of Tdap during every pregnancy, to protect the newborn from pertussis. Infants are most at risk for severe, life-threatening  complications from pertussis. A similar vaccine, called Td, protects from tetanus and diphtheria, but not pertussis. A Td booster should be given every 10 years. Tdap may be given as one of these boosters if you have not already gotten a dose. Tdap may also be given after a severe cut or burn to prevent tetanus infection. Your doctor can give you more information. Tdap may safely be given at the same time as other vaccines. SOME PEOPLE SHOULD NOT GET THIS VACCINE  If you ever had a life-threatening allergic reaction after a dose of any tetanus, diphtheria, or pertussis containing vaccine, OR if you have a severe allergy to any part of this vaccine, you should not get Tdap. Tell your doctor if you have any severe allergies.  If you had a coma, or long or multiple seizures within 7 days after a childhood dose of DTP or DTaP, you should not get Tdap, unless a cause other than the vaccine was found. You can still get Td.  Talk to your doctor if you:  have epilepsy or another nervous system problem,  had severe pain or swelling after any vaccine containing diphtheria, tetanus or pertussis,  ever had Guillain-Barr Syndrome (GBS),  aren't feeling well on the day the shot is scheduled. RISKS OF A VACCINE REACTION With any medicine, including vaccines, there is a chance of side effects. These are usually mild and go away on their own, but serious reactions are also possible. Brief fainting spells can follow a vaccination, leading to injuries from falling. Sitting or lying down for about 15 minutes can help prevent these. Tell your doctor if you feel dizzy or light-headed, or   have vision changes or ringing in the ears. Mild problems following Tdap (Did not interfere with activities)  Pain where the shot was given (about 3 in 4 adolescents or 2 in 3 adults)  Redness or swelling where the shot was given (about 1 person in 5)  Mild fever of at least 100.4F (up to about 1 in 25 adolescents or 1 in  100 adults)  Headache (about 3 or 4 people in 10)  Tiredness (about 1 person in 3 or 4)  Nausea, vomiting, diarrhea, stomach ache (up to 1 in 4 adolescents or 1 in 10 adults)  Chills, body aches, sore joints, rash, swollen glands (uncommon) Moderate problems following Tdap (Interfered with activities, but did not require medical attention)  Pain where the shot was given (about 1 in 5 adolescents or 1 in 100 adults)  Redness or swelling where the shot was given (up to about 1 in 16 adolescents or 1 in 25 adults)  Fever over 102F (about 1 in 100 adolescents or 1 in 250 adults)  Headache (about 3 in 20 adolescents or 1 in 10 adults)  Nausea, vomiting, diarrhea, stomach ache (up to 1 or 3 people in 100)  Swelling of the entire arm where the shot was given (up to about 3 in 100). Severe problems following Tdap (Unable to perform usual activities, required medical attention)  Swelling, severe pain, bleeding and redness in the arm where the shot was given (rare). A severe allergic reaction could occur after any vaccine (estimated less than 1 in a million doses). WHAT IF THERE IS A SERIOUS REACTION? What should I look for?  Look for anything that concerns you, such as signs of a severe allergic reaction, very high fever, or behavior changes. Signs of a severe allergic reaction can include hives, swelling of the face and throat, difficulty breathing, a fast heartbeat, dizziness, and weakness. These would start a few minutes to a few hours after the vaccination. What should I do?  If you think it is a severe allergic reaction or other emergency that can't wait, call 9-1-1 or get the person to the nearest hospital. Otherwise, call your doctor.  Afterward, the reaction should be reported to the "Vaccine Adverse Event Reporting System" (VAERS). Your doctor might file this report, or you can do it yourself through the VAERS web site at www.vaers.hhs.gov, or by calling 1-800-822-7967. VAERS is  only for reporting reactions. They do not give medical advice.  THE NATIONAL VACCINE INJURY COMPENSATION PROGRAM The National Vaccine Injury Compensation Program (VICP) is a federal program that was created to compensate people who may have been injured by certain vaccines. Persons who believe they may have been injured by a vaccine can learn about the program and about filing a claim by calling 1-800-338-2382 or visiting the VICP website at www.hrsa.gov/vaccinecompensation. HOW CAN I LEARN MORE?  Ask your doctor.  Call your local or state health department.  Contact the Centers for Disease Control and Prevention (CDC):  Call 1-800-232-4636 or visit CDC's website at www.cdc.gov/vaccines. CDC Tdap Vaccine VIS (09/26/11) Document Released: 11/05/2011 Document Revised: 01/29/2012 Document Reviewed: 11/05/2011 ExitCare Patient Information 2014 [employer]. Glucose Tolerance Test This is a test to see how your body processes carbohydrates. This test is often done to check patients for diabetes or the possibility of developing it. PREPARATION FOR TEST You should have nothing to eat or drink 12 hours before the test. You will be given a form of sugar (glucose) and then blood samples will be   drawn from your vein to determine the level of sugar in your blood. Alternatively, blood may be drawn from your finger for testing. You should not smoke or exercise during the test. NORMAL FINDINGS  Fasting: 70-115 mg/dL  30 minutes: less than 200 mg/dL  1 hour: less than 200 mg/dL  2 hours: less than 140 mg/dL  3 hours: 70-115 mg/dL  4 hours: 70-115 mg/dL Ranges for normal findings may vary among different laboratories and hospitals. You should always check with your doctor after having lab work or other tests done to discuss the meaning of your test results and whether your values are considered within normal limits. MEANING OF TEST Your caregiver will go over the test results with you and discuss  the importance and meaning of your results, as well as treatment options and the need for additional tests. OBTAINING THE TEST RESULTS It is your responsibility to obtain your test results. Ask the lab or department performing the test when and how you will get your results. Document Released: 05/29/2004 Document Revised: 07/29/2011 Document Reviewed: 04/16/2008 ExitCare Patient Information 2014 [employer].  

## 2012-11-26 NOTE — Progress Notes (Signed)
Doing well post-op 

## 2012-12-17 ENCOUNTER — Encounter: Payer: Medicaid Other | Admitting: Obstetrics & Gynecology

## 2012-12-23 ENCOUNTER — Other Ambulatory Visit: Payer: Medicaid Other

## 2012-12-23 ENCOUNTER — Ambulatory Visit (INDEPENDENT_AMBULATORY_CARE_PROVIDER_SITE_OTHER): Payer: Medicaid Other | Admitting: Obstetrics & Gynecology

## 2012-12-23 VITALS — BP 112/74 | Temp 98.0°F | Wt 189.0 lb

## 2012-12-23 DIAGNOSIS — Z3480 Encounter for supervision of other normal pregnancy, unspecified trimester: Secondary | ICD-10-CM

## 2012-12-23 DIAGNOSIS — Z3482 Encounter for supervision of other normal pregnancy, second trimester: Secondary | ICD-10-CM

## 2012-12-23 LAB — POCT URINALYSIS DIPSTICK
Blood, UA: NEGATIVE
Glucose, UA: NEGATIVE
Nitrite, UA: NEGATIVE
Spec Grav, UA: 1.02
Urobilinogen, UA: NEGATIVE

## 2012-12-23 NOTE — Progress Notes (Signed)
P-88 Pt states she has pressure in her lower abdomen from time to time.

## 2012-12-24 ENCOUNTER — Encounter: Payer: Self-pay | Admitting: Obstetrics & Gynecology

## 2012-12-24 LAB — CBC
Hemoglobin: 10.7 g/dL — ABNORMAL LOW (ref 12.0–15.0)
MCH: 29.1 pg (ref 26.0–34.0)
MCHC: 33.3 g/dL (ref 30.0–36.0)
MCV: 87.2 fL (ref 78.0–100.0)
RBC: 3.68 MIL/uL — ABNORMAL LOW (ref 3.87–5.11)

## 2012-12-24 LAB — GLUCOSE TOLERANCE, 2 HOURS W/ 1HR
Glucose, 1 hour: 80 mg/dL (ref 70–170)
Glucose, Fasting: 57 mg/dL — ABNORMAL LOW (ref 70–99)

## 2012-12-31 ENCOUNTER — Inpatient Hospital Stay (HOSPITAL_COMMUNITY)
Admission: AD | Admit: 2012-12-31 | Payer: Medicaid Other | Source: Ambulatory Visit | Admitting: Obstetrics & Gynecology

## 2013-01-06 ENCOUNTER — Encounter: Payer: Self-pay | Admitting: Obstetrics

## 2013-01-13 ENCOUNTER — Encounter: Payer: Medicaid Other | Admitting: Obstetrics & Gynecology

## 2013-01-21 ENCOUNTER — Encounter: Payer: Medicaid Other | Admitting: Obstetrics & Gynecology

## 2013-01-25 ENCOUNTER — Ambulatory Visit (INDEPENDENT_AMBULATORY_CARE_PROVIDER_SITE_OTHER): Payer: Medicaid Other | Admitting: Obstetrics & Gynecology

## 2013-01-25 ENCOUNTER — Encounter: Payer: Self-pay | Admitting: Obstetrics & Gynecology

## 2013-01-25 VITALS — BP 116/76 | Temp 98.5°F | Wt 194.0 lb

## 2013-01-25 DIAGNOSIS — Z3480 Encounter for supervision of other normal pregnancy, unspecified trimester: Secondary | ICD-10-CM

## 2013-01-25 DIAGNOSIS — Z23 Encounter for immunization: Secondary | ICD-10-CM

## 2013-01-25 LAB — POCT URINALYSIS DIPSTICK
Blood, UA: NEGATIVE
Glucose, UA: NEGATIVE
Ketones, UA: NEGATIVE
Spec Grav, UA: 1.01

## 2013-01-25 NOTE — Progress Notes (Signed)
[name] Patient reports she has some pain and pressure at times.

## 2013-01-25 NOTE — Patient Instructions (Signed)
Sterilization Information, Female Female sterilization is a procedure to permanently prevent pregnancy. There are different ways to perform sterilization, but all either block or close the fallopian tubes so that your eggs cannot reach your uterus. If your egg cannot reach your uterus, sperm cannot fertilize the egg, and you cannot get pregnant.  Sterilization is performed by a surgical procedure. Sometimes these procedures are performed in a hospital while a patient is asleep. Sometimes they can be done in a clinic setting with the patient awake. The fallopian tubes can be surgically cut, tied, or sealed through a procedure called tubal ligation. The fallopian tubes can also be closed with clips or rings. Sterilization can also be done by placing a tiny coil into each fallopian tube, which causes scar tissue to grow inside the tube. The scar tissue then blocks the tubes.  Discuss sterilization with your caregiver to answer any concerns you or your partner may have. You may want to ask what type of sterilization your caregiver performs. Some caregivers may not perform all the various options. Sterilization is permanent and should only be done if you are sure you do not want children or do not want any more children. Having a sterilization reversed may not be successful.  STERILIZATION PROCEDURES  Laparoscopic sterilization. This is a surgical method performed at a time other than right after childbirth. Two incisions are made in the lower abdomen. A thin, lighted tube (laparoscope) is inserted into one of the incisions and is used to perform the procedure. The fallopian tubes are closed with a ring or a clip. An instrument that uses heat could be used to seal the tubes closed (electrocautery).   Mini-laparotomy. This is a surgical method done 1 or 2 days after giving birth. Typically, a small incision is made just below the belly button (umbilicus) and the fallopian tubes are exposed. The tubes can then be  sealed, tied, or cut.   Hysteroscopic sterilization. This is performed at a time other than right after childbirth. A tiny, spring-like coil is inserted through the cervix and uterus and placed into the fallopian tubes. The coil causes scaring and blocks the tubes. Other forms of contraception should be used for 3 months after the procedure to allow the scar tissue to form completely. Additionally, it is required hysterosalpingography be done 3 months later to ensure that the procedure was successful. Hysterosalpingography is a procedure that uses X-rays to look at your uterus and fallopian tubes after a material to make them show up better has been inserted. IS STERILIZATION SAFE? Sterilization is considered safe with very rare complications. Risks depend on the type of procedure you have. As with any surgical procedure, there are risks. Some risks of sterilization by any means include:   Bleeding.  Infection.  Reaction to anesthesia medicine.  Injury to surrounding organs. Risks specific to having hysteroscopic coils placed include:  The coils may not be placed correctly the first time.   The coils may move out of place.   The tubes may not get completely blocked after 3 months.   Injury to surrounding organs when placing the coil.  HOW EFFECTIVE IS FEMALE STERILIZATION? Sterilization is nearly 100% effective, but it can fail. Depending on the type of sterilization, the rate of failure can be as high as 3%. After hysteroscopic sterilization with placement of fallopian tube coils, you will need back-up birth control for 3 months after the procedure. Sterilization is effective for a lifetime.  BENEFITS OF STERILIZATION  It does   not affect your hormones, and therefore will not affect your menstrual periods, sexual desire, or performance.   It is effective for a lifetime.   It is safe.   You do not need to worry about getting pregnant. Keep in mind that if you had the  hysteroscopic placement procedure, you must wait 3 months after the procedure (or until your caregiver confirms) before pregnancy is not considered possible.   There are no side effects unlike other types of birth control (contraception).  DRAWBACKS OF STERILIZATION  You must be sure you do not want children or any more children. The procedure is permanent.   It does not provide protection against sexually transmitted infections (STIs).   The tubes can grow back together. If this happens, there is a risk of pregnancy. There is also an increased risk (50%) of pregnancy being an ectopic pregnancy. This is a pregnancy that happens outside of the uterus. Document Released: 10/23/2007 Document Revised: 11/05/2011 Document Reviewed: 08/22/2011 ExitCare Patient Information 2014 [employer].  

## 2013-01-25 NOTE — Addendum Note (Signed)
Addended by: Glendell Docker on: 01/25/2013 01:28 PM   Modules accepted: Orders

## 2013-01-25 NOTE — Progress Notes (Signed)
Plans BTL.  U/S for growth.  Referral to nutritionist re: excessive weight gain.  Flu vaccine today.

## 2013-01-28 ENCOUNTER — Encounter: Payer: Self-pay | Admitting: Obstetrics & Gynecology

## 2013-02-02 ENCOUNTER — Encounter: Payer: Self-pay | Admitting: Obstetrics & Gynecology

## 2013-02-02 ENCOUNTER — Ambulatory Visit (INDEPENDENT_AMBULATORY_CARE_PROVIDER_SITE_OTHER): Payer: Medicaid Other

## 2013-02-02 DIAGNOSIS — Z3483 Encounter for supervision of other normal pregnancy, third trimester: Secondary | ICD-10-CM

## 2013-02-02 DIAGNOSIS — O0990 Supervision of high risk pregnancy, unspecified, unspecified trimester: Secondary | ICD-10-CM

## 2013-02-02 LAB — US OB DETAIL + 14 WK

## 2013-02-08 ENCOUNTER — Encounter: Payer: Medicaid Other | Admitting: Obstetrics & Gynecology

## 2013-02-15 ENCOUNTER — Ambulatory Visit (INDEPENDENT_AMBULATORY_CARE_PROVIDER_SITE_OTHER): Payer: Medicaid Other | Admitting: Obstetrics & Gynecology

## 2013-02-15 ENCOUNTER — Encounter: Payer: Medicaid Other | Admitting: Obstetrics & Gynecology

## 2013-02-15 ENCOUNTER — Encounter: Payer: Self-pay | Admitting: Obstetrics & Gynecology

## 2013-02-15 VITALS — BP 102/77 | Temp 98.2°F | Wt 198.0 lb

## 2013-02-15 DIAGNOSIS — Z113 Encounter for screening for infections with a predominantly sexual mode of transmission: Secondary | ICD-10-CM

## 2013-02-15 DIAGNOSIS — Z3483 Encounter for supervision of other normal pregnancy, third trimester: Secondary | ICD-10-CM

## 2013-02-15 DIAGNOSIS — O4700 False labor before 37 completed weeks of gestation, unspecified trimester: Secondary | ICD-10-CM

## 2013-02-15 DIAGNOSIS — Z3480 Encounter for supervision of other normal pregnancy, unspecified trimester: Secondary | ICD-10-CM

## 2013-02-15 LAB — WET PREP BY MOLECULAR PROBE
Candida species: NEGATIVE
Gardnerella vaginalis: POSITIVE — AB
Trichomonas vaginosis: NEGATIVE

## 2013-02-15 LAB — POCT URINALYSIS DIPSTICK
Blood, UA: NEGATIVE
Glucose, UA: NEGATIVE
Spec Grav, UA: 1.02
Urobilinogen, UA: NEGATIVE
pH, UA: 5

## 2013-02-15 NOTE — Patient Instructions (Signed)

## 2013-02-15 NOTE — Progress Notes (Signed)
P-109 Pt states she is having pain and pressure in lower abdomen.  Counseled re: excessive weight gain.

## 2013-02-16 LAB — CULTURE, OB URINE

## 2013-02-22 ENCOUNTER — Encounter: Payer: Self-pay | Admitting: Obstetrics & Gynecology

## 2013-02-22 ENCOUNTER — Ambulatory Visit (INDEPENDENT_AMBULATORY_CARE_PROVIDER_SITE_OTHER): Payer: Medicaid Other | Admitting: Obstetrics & Gynecology

## 2013-02-22 VITALS — BP 97/63 | Temp 97.9°F | Wt 195.0 lb

## 2013-02-22 DIAGNOSIS — Z3480 Encounter for supervision of other normal pregnancy, unspecified trimester: Secondary | ICD-10-CM

## 2013-02-22 DIAGNOSIS — Z3483 Encounter for supervision of other normal pregnancy, third trimester: Secondary | ICD-10-CM

## 2013-02-22 LAB — POCT URINALYSIS DIPSTICK: Spec Grav, UA: 1.01

## 2013-02-22 NOTE — Progress Notes (Signed)
Pulse- 86 Pt states she has 1-2 contractions in the evenings. Pt states she is having vaginal pain and pressure.  Doing well.

## 2013-02-27 ENCOUNTER — Telehealth: Payer: Self-pay | Admitting: *Deleted

## 2013-02-27 MED ORDER — METRONIDAZOLE 500 MG PO TABS: 500.0000 mg | ORAL_TABLET | Freq: Two times a day (BID) | ORAL | Status: DC

## 2013-02-27 NOTE — Telephone Encounter (Signed)
Message copied by Glendell Docker on Sat Feb 27, 2013  9:57 AM ------      Message from: Antionette Char      Created: Fri Feb 19, 2013  2:13 PM       Call in Rx for BV. ------

## 2013-02-27 NOTE — Telephone Encounter (Signed)
Call placed to patient, no answer. A detailed voice message was left informing patient of rx to pharmacy. A voice messge was left for patient to call office if any questions.

## 2013-03-08 ENCOUNTER — Ambulatory Visit (INDEPENDENT_AMBULATORY_CARE_PROVIDER_SITE_OTHER): Payer: Medicaid Other | Admitting: Obstetrics & Gynecology

## 2013-03-08 VITALS — BP 107/72 | Temp 98.9°F | Wt 195.0 lb

## 2013-03-08 DIAGNOSIS — Z3480 Encounter for supervision of other normal pregnancy, unspecified trimester: Secondary | ICD-10-CM

## 2013-03-08 DIAGNOSIS — Z3483 Encounter for supervision of other normal pregnancy, third trimester: Secondary | ICD-10-CM

## 2013-03-08 LAB — POCT URINALYSIS DIPSTICK
Ketones, UA: NEGATIVE
Protein, UA: NEGATIVE
Spec Grav, UA: 1.01
pH, UA: 7

## 2013-03-08 LAB — OB RESULTS CONSOLE GC/CHLAMYDIA: Gonorrhea: NEGATIVE

## 2013-03-08 NOTE — Progress Notes (Signed)
P 87  Patient reports she is having pressure and pain in vagina

## 2013-03-09 ENCOUNTER — Encounter: Payer: Self-pay | Admitting: Obstetrics & Gynecology

## 2013-03-09 ENCOUNTER — Other Ambulatory Visit (INDEPENDENT_AMBULATORY_CARE_PROVIDER_SITE_OTHER): Payer: Medicaid Other

## 2013-03-09 ENCOUNTER — Other Ambulatory Visit: Payer: Self-pay | Admitting: Obstetrics & Gynecology

## 2013-03-09 DIAGNOSIS — Z3480 Encounter for supervision of other normal pregnancy, unspecified trimester: Secondary | ICD-10-CM

## 2013-03-09 DIAGNOSIS — O365931 Maternal care for other known or suspected poor fetal growth, third trimester, fetus 1: Secondary | ICD-10-CM

## 2013-03-09 DIAGNOSIS — O3663X1 Maternal care for excessive fetal growth, third trimester, fetus 1: Secondary | ICD-10-CM

## 2013-03-09 LAB — US OB DETAIL + 14 WK

## 2013-03-09 NOTE — Patient Instructions (Signed)
Levonorgestrel intrauterine device (IUD) What is this medicine? LEVONORGESTREL IUD (LEE voe nor jes trel) is a contraceptive (birth control) device. The device is placed inside the uterus by a healthcare professional. It is used to prevent pregnancy and can also be used to treat heavy bleeding that occurs during your period. Depending on the device, it can be used for 3 to 5 years. This medicine may be used for other purposes; ask your health care provider or pharmacist if you have questions. What should I tell my health care provider before I take this medicine? They need to know if you have any of these conditions: -abnormal Pap smear -cancer of the breast, uterus, or cervix -diabetes -endometritis -genital or pelvic infection now or in the past -have more than one sexual partner or your partner has more than one partner -heart disease -history of an ectopic or tubal pregnancy -immune system problems -IUD in place -liver disease or tumor -problems with blood clots or take blood-thinners -use intravenous drugs -uterus of unusual shape -vaginal bleeding that has not been explained -an unusual or allergic reaction to levonorgestrel, other hormones, silicone, or polyethylene, medicines, foods, dyes, or preservatives -pregnant or trying to get pregnant -breast-feeding How should I use this medicine? This device is placed inside the uterus by a health care professional. Talk to your pediatrician regarding the use of this medicine in children. Special care may be needed. Overdosage: If you think you have taken too much of this medicine contact a poison control center or emergency room at once. NOTE: This medicine is only for you. Do not share this medicine with others. What if I miss a dose? This does not apply. What may interact with this medicine? Do not take this medicine with any of the following medications: -amprenavir -bosentan -fosamprenavir This medicine may also interact with  the following medications: -aprepitant -barbiturate medicines for inducing sleep or treating seizures -bexarotene -griseofulvin -medicines to treat seizures like carbamazepine, ethotoin, felbamate, oxcarbazepine, phenytoin, topiramate -modafinil -pioglitazone -rifabutin -rifampin -rifapentine -some medicines to treat HIV infection like atazanavir, indinavir, lopinavir, nelfinavir, tipranavir, ritonavir -St. John's wort -warfarin This list may not describe all possible interactions. Give your health care provider a list of all the medicines, herbs, non-prescription drugs, or dietary supplements you use. Also tell them if you smoke, drink alcohol, or use illegal drugs. Some items may interact with your medicine. What should I watch for while using this medicine? Visit your doctor or health care professional for regular check ups. See your doctor if you or your partner has sexual contact with others, becomes HIV positive, or gets a sexual transmitted disease. This product does not protect you against HIV infection (AIDS) or other sexually transmitted diseases. You can check the placement of the IUD yourself by reaching up to the top of your vagina with clean fingers to feel the threads. Do not pull on the threads. It is a good habit to check placement after each menstrual period. Call your doctor right away if you feel more of the IUD than just the threads or if you cannot feel the threads at all. The IUD may come out by itself. You may become pregnant if the device comes out. If you notice that the IUD has come out use a backup birth control method like condoms and call your health care provider. Using tampons will not change the position of the IUD and are okay to use during your period. What side effects may I notice from receiving this medicine?   Side effects that you should report to your doctor or health care professional as soon as possible: -allergic reactions like skin rash, itching or  hives, swelling of the face, lips, or tongue -fever, flu-like symptoms -genital sores -high blood pressure -no menstrual period for 6 weeks during use -pain, swelling, warmth in the leg -pelvic pain or tenderness -severe or sudden headache -signs of pregnancy -stomach cramping -sudden shortness of breath -trouble with balance, talking, or walking -unusual vaginal bleeding, discharge -yellowing of the eyes or skin Side effects that usually do not require medical attention (report to your doctor or health care professional if they continue or are bothersome): -acne -breast pain -change in sex drive or performance -changes in weight -cramping, dizziness, or faintness while the device is being inserted -headache -irregular menstrual bleeding within first 3 to 6 months of use -nausea This list may not describe all possible side effects. Call your doctor for medical advice about side effects. You may report side effects to FDA at 1-800-FDA-1088. Where should I keep my medicine? This does not apply. NOTE: This sheet is a summary. It may not cover all possible information. If you have questions about this medicine, talk to your doctor, pharmacist, or health care provider.  2013, [employer]. (06/06/2011 1:54:04 PM)  

## 2013-03-15 ENCOUNTER — Ambulatory Visit (INDEPENDENT_AMBULATORY_CARE_PROVIDER_SITE_OTHER): Payer: Medicaid Other | Admitting: Obstetrics & Gynecology

## 2013-03-15 ENCOUNTER — Encounter: Payer: Self-pay | Admitting: Obstetrics & Gynecology

## 2013-03-15 VITALS — BP 111/66 | Temp 99.0°F | Wt 200.0 lb

## 2013-03-15 DIAGNOSIS — Z3480 Encounter for supervision of other normal pregnancy, unspecified trimester: Secondary | ICD-10-CM

## 2013-03-15 DIAGNOSIS — Z3483 Encounter for supervision of other normal pregnancy, third trimester: Secondary | ICD-10-CM

## 2013-03-15 LAB — POCT URINALYSIS DIPSTICK
Blood, UA: NEGATIVE
Protein, UA: NEGATIVE
Spec Grav, UA: 1.015
Urobilinogen, UA: NEGATIVE

## 2013-03-15 NOTE — Progress Notes (Signed)
HR - 78 Routine OB visit, pt has productive cough with yellow sputum, nasal congestion, states had sore throat two days ago but it has subsided, denies fever, states she has lower abd pressure and pain to upper abd.

## 2013-03-22 ENCOUNTER — Ambulatory Visit (INDEPENDENT_AMBULATORY_CARE_PROVIDER_SITE_OTHER): Payer: Medicaid Other | Admitting: Obstetrics & Gynecology

## 2013-03-22 VITALS — BP 87/56 | Temp 97.4°F | Wt 196.0 lb

## 2013-03-22 DIAGNOSIS — Z3480 Encounter for supervision of other normal pregnancy, unspecified trimester: Secondary | ICD-10-CM

## 2013-03-22 DIAGNOSIS — Z3483 Encounter for supervision of other normal pregnancy, third trimester: Secondary | ICD-10-CM

## 2013-03-22 LAB — POCT URINALYSIS DIPSTICK
Ketones, UA: NEGATIVE
Spec Grav, UA: 1.01
pH, UA: 6

## 2013-03-22 NOTE — Progress Notes (Signed)
Pulse-96 Pt states she is having pressure in lower abdomen.

## 2013-03-29 ENCOUNTER — Encounter: Payer: Medicaid Other | Admitting: Obstetrics & Gynecology

## 2013-03-31 ENCOUNTER — Ambulatory Visit (INDEPENDENT_AMBULATORY_CARE_PROVIDER_SITE_OTHER): Payer: Medicaid Other | Admitting: Obstetrics & Gynecology

## 2013-03-31 ENCOUNTER — Encounter: Payer: Self-pay | Admitting: Obstetrics & Gynecology

## 2013-03-31 VITALS — BP 105/72 | Temp 97.9°F | Wt 199.0 lb

## 2013-03-31 DIAGNOSIS — Z3483 Encounter for supervision of other normal pregnancy, third trimester: Secondary | ICD-10-CM

## 2013-03-31 DIAGNOSIS — Z3480 Encounter for supervision of other normal pregnancy, unspecified trimester: Secondary | ICD-10-CM

## 2013-03-31 LAB — POCT URINALYSIS DIPSTICK
Bilirubin, UA: NEGATIVE
Blood, UA: NEGATIVE
Ketones, UA: NEGATIVE
pH, UA: 6

## 2013-03-31 NOTE — Progress Notes (Signed)
HR - 80 Pt in office for routine OB visit, plans Nexplanon postpartum.

## 2013-04-01 ENCOUNTER — Encounter (HOSPITAL_COMMUNITY): Payer: Medicaid Other | Admitting: Anesthesiology

## 2013-04-01 ENCOUNTER — Inpatient Hospital Stay (HOSPITAL_COMMUNITY): Payer: Medicaid Other | Admitting: Anesthesiology

## 2013-04-01 ENCOUNTER — Inpatient Hospital Stay (HOSPITAL_COMMUNITY)
Admission: AD | Admit: 2013-04-01 | Discharge: 2013-04-03 | DRG: 775 | Disposition: A | Discharge status: Home / Self Care | Payer: Medicaid Other | Source: Ambulatory Visit | Attending: Obstetrics | Admitting: Obstetrics

## 2013-04-01 ENCOUNTER — Encounter (HOSPITAL_COMMUNITY): Payer: Self-pay | Admitting: *Deleted

## 2013-04-01 DIAGNOSIS — K219 Gastro-esophageal reflux disease without esophagitis: Secondary | ICD-10-CM

## 2013-04-01 DIAGNOSIS — Z3482 Encounter for supervision of other normal pregnancy, second trimester: Secondary | ICD-10-CM

## 2013-04-01 DIAGNOSIS — K8000 Calculus of gallbladder with acute cholecystitis without obstruction: Secondary | ICD-10-CM

## 2013-04-01 DIAGNOSIS — E559 Vitamin D deficiency, unspecified: Secondary | ICD-10-CM

## 2013-04-01 LAB — TYPE AND SCREEN
ABO/RH(D): B POS
Antibody Screen: NEGATIVE

## 2013-04-01 LAB — CBC
Hemoglobin: 10.9 g/dL — ABNORMAL LOW (ref 12.0–15.0)
MCH: 29.1 pg (ref 26.0–34.0)
RBC: 3.75 MIL/uL — ABNORMAL LOW (ref 3.87–5.11)

## 2013-04-01 LAB — RPR: RPR Ser Ql: NONREACTIVE

## 2013-04-01 MED ORDER — ZOLPIDEM TARTRATE 5 MG PO TABS: 5.0000 mg | ORAL_TABLET | Freq: Every evening | ORAL | Status: DC | PRN

## 2013-04-01 MED ORDER — OXYTOCIN 40 UNITS IN LACTATED RINGERS INFUSION - SIMPLE MED: 62.5000 mL/h | INTRAVENOUS | Status: DC | PRN

## 2013-04-01 MED ORDER — LANOLIN HYDROUS EX OINT: TOPICAL_OINTMENT | CUTANEOUS | Status: DC | PRN

## 2013-04-01 MED ORDER — TETANUS-DIPHTH-ACELL PERTUSSIS 5-2.5-18.5 LF-MCG/0.5 IM SUSP: 0.5000 mL | Freq: Once | INTRAMUSCULAR | Status: DC

## 2013-04-01 MED ORDER — OXYTOCIN 40 UNITS IN LACTATED RINGERS INFUSION - SIMPLE MED
1.0000 m[IU]/min | INTRAVENOUS | Status: DC
  Administered 2013-04-01: 666 m[IU]/min via INTRAVENOUS
  Administered 2013-04-01: 2 m[IU]/min via INTRAVENOUS
  Filled 2013-04-01: qty 1000

## 2013-04-01 MED ORDER — LACTATED RINGERS IV SOLN
INTRAVENOUS | Status: DC
  Administered 2013-04-01: 11:00:00 via INTRAVENOUS

## 2013-04-01 MED ORDER — OXYCODONE-ACETAMINOPHEN 5-325 MG PO TABS: 1.0000 | ORAL_TABLET | ORAL | Status: DC | PRN

## 2013-04-01 MED ORDER — PRENATAL MULTIVITAMIN CH
1.0000 | ORAL_TABLET | Freq: Every day | ORAL | Status: DC
  Administered 2013-04-02: 1 via ORAL
  Filled 2013-04-01: qty 1

## 2013-04-01 MED ORDER — DIPHENHYDRAMINE HCL 50 MG/ML IJ SOLN: 12.5000 mg | INTRAMUSCULAR | Status: DC | PRN

## 2013-04-01 MED ORDER — IBUPROFEN 600 MG PO TABS
600.0000 mg | ORAL_TABLET | Freq: Four times a day (QID) | ORAL | Status: DC
  Administered 2013-04-01 – 2013-04-03 (×7): 600 mg via ORAL
  Filled 2013-04-01 (×7): qty 1

## 2013-04-01 MED ORDER — DIBUCAINE 1 % RE OINT: 1.0000 "application " | TOPICAL_OINTMENT | RECTAL | Status: DC | PRN

## 2013-04-01 MED ORDER — ONDANSETRON HCL 4 MG PO TABS: 4.0000 mg | ORAL_TABLET | ORAL | Status: DC | PRN

## 2013-04-01 MED ORDER — TERBUTALINE SULFATE 1 MG/ML IJ SOLN: 0.2500 mg | Freq: Once | INTRAMUSCULAR | Status: DC | PRN

## 2013-04-01 MED ORDER — OXYTOCIN BOLUS FROM INFUSION: 500.0000 mL | INTRAVENOUS | Status: DC

## 2013-04-01 MED ORDER — ONDANSETRON HCL 4 MG/2ML IJ SOLN: 4.0000 mg | Freq: Four times a day (QID) | INTRAMUSCULAR | Status: DC | PRN

## 2013-04-01 MED ORDER — EPHEDRINE 5 MG/ML INJ
10.0000 mg | INTRAVENOUS | Status: DC | PRN
  Filled 2013-04-01: qty 4
  Filled 2013-04-01: qty 2

## 2013-04-01 MED ORDER — ONDANSETRON HCL 4 MG/2ML IJ SOLN: 4.0000 mg | INTRAMUSCULAR | Status: DC | PRN

## 2013-04-01 MED ORDER — PHENYLEPHRINE 40 MCG/ML (10ML) SYRINGE FOR IV PUSH (FOR BLOOD PRESSURE SUPPORT)
80.0000 ug | PREFILLED_SYRINGE | INTRAVENOUS | Status: DC | PRN
  Filled 2013-04-01: qty 2

## 2013-04-01 MED ORDER — PHENYLEPHRINE 40 MCG/ML (10ML) SYRINGE FOR IV PUSH (FOR BLOOD PRESSURE SUPPORT)
80.0000 ug | PREFILLED_SYRINGE | INTRAVENOUS | Status: DC | PRN
  Administered 2013-04-01 (×2): 80 ug via INTRAVENOUS
  Filled 2013-04-01: qty 2
  Filled 2013-04-01: qty 10

## 2013-04-01 MED ORDER — DIPHENHYDRAMINE HCL 25 MG PO CAPS: 25.0000 mg | ORAL_CAPSULE | Freq: Four times a day (QID) | ORAL | Status: DC | PRN

## 2013-04-01 MED ORDER — LACTATED RINGERS IV SOLN: 500.0000 mL | Freq: Once | INTRAVENOUS | Status: DC

## 2013-04-01 MED ORDER — IBUPROFEN 600 MG PO TABS: 600.0000 mg | ORAL_TABLET | Freq: Four times a day (QID) | ORAL | Status: DC | PRN

## 2013-04-01 MED ORDER — FLEET ENEMA 7-19 GM/118ML RE ENEM: 1.0000 | ENEMA | RECTAL | Status: DC | PRN

## 2013-04-01 MED ORDER — EPHEDRINE 5 MG/ML INJ
10.0000 mg | INTRAVENOUS | Status: DC | PRN
  Filled 2013-04-01: qty 2

## 2013-04-01 MED ORDER — LACTATED RINGERS IV SOLN: 500.0000 mL | INTRAVENOUS | Status: DC | PRN

## 2013-04-01 MED ORDER — CITRIC ACID-SODIUM CITRATE 334-500 MG/5ML PO SOLN: 30.0000 mL | ORAL | Status: DC | PRN

## 2013-04-01 MED ORDER — ACETAMINOPHEN 325 MG PO TABS: 650.0000 mg | ORAL_TABLET | ORAL | Status: DC | PRN

## 2013-04-01 MED ORDER — SENNOSIDES-DOCUSATE SODIUM 8.6-50 MG PO TABS
2.0000 | ORAL_TABLET | ORAL | Status: DC
  Filled 2013-04-01 (×2): qty 2

## 2013-04-01 MED ORDER — WITCH HAZEL-GLYCERIN EX PADS: 1.0000 "application " | MEDICATED_PAD | CUTANEOUS | Status: DC | PRN

## 2013-04-01 MED ORDER — SODIUM BICARBONATE 8.4 % IV SOLN
INTRAVENOUS | Status: DC | PRN
  Administered 2013-04-01: 5 mL via EPIDURAL

## 2013-04-01 MED ORDER — OXYTOCIN 40 UNITS IN LACTATED RINGERS INFUSION - SIMPLE MED: 62.5000 mL/h | INTRAVENOUS | Status: DC

## 2013-04-01 MED ORDER — SIMETHICONE 80 MG PO CHEW: 80.0000 mg | CHEWABLE_TABLET | ORAL | Status: DC | PRN

## 2013-04-01 MED ORDER — BENZOCAINE-MENTHOL 20-0.5 % EX AERO: 1.0000 "application " | INHALATION_SPRAY | CUTANEOUS | Status: DC | PRN

## 2013-04-01 MED ORDER — LIDOCAINE HCL (PF) 1 % IJ SOLN
30.0000 mL | INTRAMUSCULAR | Status: DC | PRN
  Filled 2013-04-01 (×2): qty 30

## 2013-04-01 MED ORDER — FENTANYL 2.5 MCG/ML BUPIVACAINE 1/10 % EPIDURAL INFUSION (WH - ANES)
14.0000 mL/h | INTRAMUSCULAR | Status: DC | PRN
  Administered 2013-04-01: 14 mL/h via EPIDURAL
  Filled 2013-04-01: qty 125

## 2013-04-01 NOTE — MAU Note (Signed)
Pt brought in by EMS, states uc's started @ 0830, denies bleeding or LOF.

## 2013-04-01 NOTE — Progress Notes (Signed)
Annette Wood is a 22 y.o. G3P2002 at [redacted]w[redacted]d by LMP admitted for active labor  Subjective:   Objective: BP 103/81  Pulse 102  Temp(Src) 97.9 F (36.6 C) (Oral)  Resp 18  Ht 5\' 3"  (1.6 m)  Wt 199 lb (90.266 kg)  BMI 35.26 kg/m2  LMP 07/01/2012      FHT:  FHR: 150-160 bpm, variability: moderate,  accelerations:  Present,  decelerations:  Absent UC:   regular, every 3-4 minutes SVE:   Dilation: 6.5 Effacement (%): 80 Station: -2 Exam by:: Currie Paris, RN  Labs: Lab Results  Component Value Date   WBC 7.0 04/01/2013   HGB 10.9* 04/01/2013   HCT 31.7* 04/01/2013   MCV 84.5 04/01/2013   PLT 232 04/01/2013    Assessment / Plan: Spontaneous labor, progressing normally  Labor: Progressing normally Preeclampsia:  n/a Fetal Wellbeing:  Category I Pain Control:  Epidural I/D:  n/a Anticipated MOD:  NSVD  HARPER,CHARLES A 04/01/2013, 12:13 PM

## 2013-04-01 NOTE — Anesthesia Preprocedure Evaluation (Signed)
Anesthesia Evaluation  Patient identified by MRN, date of birth, ID band Patient awake    Reviewed: Allergy & Precautions, H&P , Patient's Chart, lab work & pertinent test results  Airway Mallampati: II  TM Distance: >3 FB Neck ROM: full    Dental  (+) Teeth Intact   Pulmonary  breath sounds clear to auscultation        Cardiovascular Rhythm:regular Rate:Normal     Neuro/Psych    GI/Hepatic GERD-  Medicated,  Endo/Other  Morbid obesity  Renal/GU      Musculoskeletal   Abdominal   Peds  Hematology   Anesthesia Other Findings       Reproductive/Obstetrics (+) Pregnancy                             Anesthesia Physical Anesthesia Plan  ASA: III  Anesthesia Plan: Epidural   Post-op Pain Management:    Induction:   Airway Management Planned:   Additional Equipment:   Intra-op Plan:   Post-operative Plan:   Informed Consent: I have reviewed the patients History and Physical, chart, labs and discussed the procedure including the risks, benefits and alternatives for the proposed anesthesia with the patient or authorized representative who has indicated his/her understanding and acceptance.   Dental Advisory Given  Plan Discussed with:   Anesthesia Plan Comments: (Labs checked- platelets confirmed with RN in room. Fetal heart tracing, per RN, reported to be stable enough for sitting procedure. Discussed epidural, and patient consents to the procedure:  included risk of possible headache,backache, failed block, allergic reaction, and nerve injury. This patient was asked if she had any questions or concerns before the procedure started.)        Anesthesia Quick Evaluation  

## 2013-04-01 NOTE — Anesthesia Procedure Notes (Signed)

## 2013-04-01 NOTE — H&P (Signed)
Annette Wood is a 22 y.o. female presenting for UC's. Maternal Medical History:  Reason for admission: Contractions.  22 yo G3 P2.  EDC 04-07-13.  Presents with UC's.  Fetal activity: Perceived fetal activity is normal.    Prenatal complications: no prenatal complications Prenatal Complications - Diabetes: none.    OB History   Grav Para Term Preterm Abortions TAB SAB Ect Mult Living   3 2 2       2      Past Medical History  Diagnosis Date  . Gastroesophageal reflux   . Chlamydia   . Gonorrhea   . Panic attack   . Gallstones   . IUP (intrauterine pregnancy), incidental 11/25/2012   Past Surgical History  Procedure Laterality Date  . No past surgeries    . Cholecystectomy N/A 11/24/2012    Procedure: LAPAROSCOPIC CHOLECYSTECTOMY WITH INTRAOPERATIVE CHOLANGIOGRAM;  Surgeon: Mariella Saa, MD;  Location: [facility];  Service: General;  Laterality: N/A;   Family History: family history includes Diabetes in her maternal grandmother; Lupus in her maternal grandmother. Social History:  reports that she has never smoked. She has never used smokeless tobacco. She reports that she does not drink alcohol or use illicit drugs.   Prenatal Transfer Tool  Maternal Diabetes: No Genetic Screening: Normal Maternal Ultrasounds/Referrals: Normal Fetal Ultrasounds or other Referrals:  None Maternal Substance Abuse:  No Significant Maternal Medications:  None Significant Maternal Lab Results:  None Other Comments:  None  Review of Systems  All other systems reviewed and are negative.    Dilation: 5 Effacement (%): 70 Station: -2 Exam by:: Dorrene German RN Blood pressure 124/82, pulse 89, temperature 97.9 F (36.6 C), temperature source Oral, resp. rate 18, height 5\' 3"  (1.6 m), weight 199 lb (90.266 kg), last menstrual period 07/01/2012. Maternal Exam:  Uterine Assessment: Contraction strength is moderate.  Abdomen: Patient reports no abdominal tenderness. Fetal presentation:  vertex  Introitus: Normal vulva. Normal vagina.  Pelvis: adequate for delivery.   Cervix: Cervix evaluated by digital exam.     Physical Exam  Nursing note and vitals reviewed. Constitutional: She is oriented to person, place, and time. She appears well-developed and well-nourished.  HENT:  Head: Normocephalic and atraumatic.  Eyes: Conjunctivae are normal. Pupils are equal, round, and reactive to light.  Neck: Normal range of motion. Neck supple.  Cardiovascular: Normal rate and regular rhythm.   Respiratory: Effort normal.  GI: Soft.  Genitourinary: Vagina normal and uterus normal.  Musculoskeletal: Normal range of motion.  Neurological: She is alert and oriented to person, place, and time.  Skin: Skin is warm and dry.  Psychiatric: She has a normal mood and affect. Her behavior is normal. Judgment and thought content normal.    Prenatal labs: ABO, Rh: B/POS/-- (04/07 1356) Antibody: NEG (04/07 1356) Rubella: 1.20 (04/07 1356) RPR: NON REAC (08/06 1115)  HBsAg: NEGATIVE (04/07 1356)  HIV: NON REACTIVE (08/06 1115)  GBS: NEGATIVE (10/20 1455)   Assessment/Plan: 39 weeks.  Active labor.  Admit.   HARPER,CHARLES A 04/01/2013, 11:02 AM

## 2013-04-02 LAB — CBC
Hemoglobin: 10.2 g/dL — ABNORMAL LOW (ref 12.0–15.0)
MCH: 28.9 pg (ref 26.0–34.0)
MCV: 84.7 fL (ref 78.0–100.0)
Platelets: 226 10*3/uL (ref 150–400)
RBC: 3.53 MIL/uL — ABNORMAL LOW (ref 3.87–5.11)
WBC: 11.2 10*3/uL — ABNORMAL HIGH (ref 4.0–10.5)

## 2013-04-02 NOTE — Progress Notes (Signed)
Post Partum Day 1 Subjective: no complaints, up ad lib, voiding, tolerating PO and + flatus  Objective: Blood pressure 117/77, pulse 85, temperature 98.2 F (36.8 C), temperature source Oral, resp. rate 18, height 5\' 3"  (1.6 m), weight 199 lb (90.266 kg), last menstrual period 07/01/2012, unknown if currently breastfeeding.  Physical Exam:  General: alert and cooperative Lochia: appropriate Uterine Fundus: firm Incision: NA DVT Evaluation: No evidence of DVT seen on physical exam.   Recent Labs  04/01/13 1105 04/02/13 0540  HGB 10.9* 10.2*  HCT 31.7* 29.9*    Assessment/Plan: Plan for discharge tomorrow, Breastfeeding and Contraception Nexplanon   LOS: 1 day   Kveon Casanas 04/02/2013, 1:00 PM

## 2013-04-02 NOTE — Progress Notes (Signed)
Patient states she does not feel she needs to be seen for panic attacks.  Patient states that, since her gallbladder surgery in July, she has had no panic attacks.  If she does have a panic attack in the future, she knows to call her primary doctor.

## 2013-04-02 NOTE — Progress Notes (Signed)
UR completed 

## 2013-04-02 NOTE — Lactation Note (Signed)
This note was copied from the chart of Annette Isella Slatten. Lactation Consultation Note RN reports that mom has decided to stop breast feeding and just formula feed baby. RN states she educated mom on breast vs. Formula.  This was confirmed with mother. Lactation office number given to mom, enc mom to call if she changes her mind or if she has any questions or concerns.  Patient Name: Annette Wood FAOZH'Y Date: 04/02/2013     Maternal Data    Feeding Feeding Type: Bottle Fed - Breast Milk  LATCH Score/Interventions                      Lactation Tools Discussed/Used     Consult Status      Annette Wood 04/02/2013, 2:46 PM

## 2013-04-02 NOTE — Anesthesia Postprocedure Evaluation (Signed)
Anesthesia Post Note  Patient: Annette Wood  Procedure(s) Performed: * No procedures listed *  Anesthesia type: Epidural  Patient location: Mother/Baby  Post pain: Pain level controlled  Post assessment: Post-op Vital signs reviewed  Last Vitals:  Filed Vitals:   04/02/13 0630  BP: 117/77  Pulse: 85  Temp: 36.8 C  Resp: 18    Post vital signs: Reviewed  Level of consciousness:alert  Complications: No apparent anesthesia complications

## 2013-04-03 NOTE — Discharge Summary (Signed)
Obstetric Discharge Summary Reason for Admission: onset of labor Prenatal Procedures: none Intrapartum Procedures: spontaneous vaginal delivery Postpartum Procedures: none Complications-Operative and Postpartum: none Hemoglobin  Date Value Range Status  04/02/2013 10.2* 12.0 - 15.0 g/dL Final     HCT  Date Value Range Status  04/02/2013 29.9* 36.0 - 46.0 % Final    Physical Exam:  General: alert Lochia: appropriate Uterine Fundus: firm Incision: healing well DVT Evaluation: No evidence of DVT seen on physical exam.  Discharge Diagnoses: Term Pregnancy-delivered  Discharge Information: Date: 04/03/2013 Activity: pelvic rest Diet: routine Medications: Percocet Condition: stable Instructions: refer to practice specific booklet Discharge to: home Follow-up Information   Follow up with HARPER,CHARLES A, MD.   Specialty:  Obstetrics and Gynecology   Contact information:   [address] [place] [address] [phone]       Newborn Data: Live born female  Birth Weight: 7 lb 1.6 oz (3221 g) APGAR: 9, 9  Home with mother.  MARSHALL,BERNARD A 04/03/2013, 7:23 AM

## 2013-04-08 ENCOUNTER — Encounter: Payer: Medicaid Other | Admitting: Obstetrics & Gynecology

## 2013-04-19 ENCOUNTER — Ambulatory Visit (INDEPENDENT_AMBULATORY_CARE_PROVIDER_SITE_OTHER): Payer: Medicaid Other | Admitting: Obstetrics & Gynecology

## 2013-04-19 ENCOUNTER — Encounter: Payer: Self-pay | Admitting: Obstetrics & Gynecology

## 2013-04-19 ENCOUNTER — Telehealth: Payer: Self-pay | Admitting: *Deleted

## 2013-04-19 VITALS — BP 135/91 | HR 83 | Temp 99.0°F | Ht 63.0 in | Wt 190.0 lb

## 2013-04-19 DIAGNOSIS — F53 Postpartum depression: Secondary | ICD-10-CM

## 2013-04-19 LAB — POCT URINALYSIS DIPSTICK
Bilirubin, UA: NEGATIVE
Glucose, UA: NEGATIVE
Ketones, UA: NEGATIVE
Nitrite, UA: NEGATIVE
Protein, UA: NEGATIVE
pH, UA: 5

## 2013-04-19 NOTE — Telephone Encounter (Signed)
Call placed to patient per Dr Tamela Oddi instruction; she was informed after power outage in the office her chart was reviewed and she was found to be depressed.  She was offered a referral to counseling and accepted. Patient was advised per Dr Antionette Char to schedule 2 week follow up. Patient verbalized understanding and agrees as instructed.

## 2013-04-19 NOTE — Progress Notes (Signed)
Patient ID: Annette Wood, female   DOB: April 11, 1991, 22 y.o.   MRN: [id number] Post partum evaluation score 17 (severe depression)

## 2013-04-19 NOTE — Progress Notes (Signed)
Subjective:     Annette Wood is a 22 y.o. female who presents for a postpartum visit. She is 2 weeks postpartum following a spontaneous vaginal delivery. I have fully reviewed the prenatal and intrapartum course. The delivery was at 39 gestational weeks. Outcome: spontaneous vaginal delivery. Anesthesia: epidural. Postpartum course has been . Baby's course has been normal. Baby is feeding by bottle Annette Wood.  Bowel function is normal. Bladder function is normal. Patient is not sexually active. Contraception method is none. Postpartum depression screening: positive, score 17, severe depression  The following portions of the patient's history were reviewed and updated as appropriate: allergies, current medications, past family history, past medical history, past social history, past surgical history and problem list.  Review of Systems Pertinent items are noted in HPI.   Objective:    BP 135/91  Pulse 83  Temp(Src) 99 F (37.2 C)  Ht 5\' 3"  (1.6 m)  Wt 190 lb (86.183 kg)  BMI 33.67 kg/m2  Breastfeeding? No        Assessment:    ?blues vs postpartum depression  Plan:   Return in 2 weeks Plans Nexplanon

## 2013-04-19 NOTE — Patient Instructions (Signed)

## 2013-05-06 ENCOUNTER — Ambulatory Visit (INDEPENDENT_AMBULATORY_CARE_PROVIDER_SITE_OTHER): Payer: Medicaid Other | Admitting: Obstetrics & Gynecology

## 2013-05-06 ENCOUNTER — Encounter: Payer: Self-pay | Admitting: Obstetrics & Gynecology

## 2013-05-06 NOTE — Progress Notes (Signed)
  Subjective:     Annette Wood is a 22 y.o. female who presents for a postpartum visit. She is 5 weeks postpartum following a spontaneous vaginal delivery. I have fully reviewed the prenatal and intrapartum course. The delivery was at 39 gestational weeks. Outcome: spontaneous vaginal delivery. Anesthesia: epidural. Postpartum course has been WNL. Baby's course has been WNL. Baby is feeding by bottle - Gerber Gentle. Bleeding no bleeding. Bowel function is normal. Bladder function is normal. Patient is not sexually active. Contraception method is none. Postpartum depression screening: negative.  The following portions of the patient's history were reviewed and updated as appropriate: allergies, current medications, past family history, past medical history, past social history, past surgical history and problem list.  Review of Systems Pertinent items are noted in HPI.   Objective:    BP 135/79  Pulse 83  Temp(Src) 97.5 F (36.4 C)  Ht 5\' 3"  (1.6 m)  Wt 189 lb (85.73 kg)  BMI 33.49 kg/m2  Breastfeeding? No       General:  alert     Abdomen: soft, non-tender; bowel sounds normal; no masses,  no organomegaly   Vulva:  normal  Vagina: normal vagina  Cervix:  no lesions  Corpus: normal size, contour, position, consistency, mobility, non-tender  Adnexa:  normal adnexa   Assessment:     Normal postpartum exam.  Pap performed today  Plan:     Follow up as needed.

## 2013-05-09 NOTE — Patient Instructions (Signed)
Contraception Choices Contraception (birth control) is the use of any methods or devices to prevent pregnancy. Below are some methods to help avoid pregnancy. HORMONAL METHODS   Contraceptive implant This is a thin, plastic tube containing progesterone hormone. It does not contain estrogen hormone. Your health care provider inserts the tube in the inner part of the upper arm. The tube can remain in place for up to 3 years. After 3 years, the implant must be removed. The implant prevents the ovaries from releasing an egg (ovulation), thickens the cervical mucus to prevent sperm from entering the uterus, and thins the lining of the inside of the uterus.  Progesterone-only injections These injections are given every 3 months by your health care provider to prevent pregnancy. This synthetic progesterone hormone stops the ovaries from releasing eggs. It also thickens cervical mucus and changes the uterine lining. This makes it harder for sperm to survive in the uterus.  Birth control pills These pills contain estrogen and progesterone hormone. They work by preventing the ovaries from releasing eggs (ovulation). They also cause the cervical mucus to thicken, preventing the sperm from entering the uterus. Birth control pills are prescribed by a health care provider.Birth control pills can also be used to treat heavy periods.  Minipill This type of birth control pill contains only the progesterone hormone. They are taken every day of each month and must be prescribed by your health care provider.  Birth control patch The patch contains hormones similar to those in birth control pills. It must be changed once a week and is prescribed by a health care provider.  Vaginal ring The ring contains hormones similar to those in birth control pills. It is left in the vagina for 3 weeks, removed for 1 week, and then a new one is put back in place. The patient must be comfortable inserting and removing the ring from the  vagina.A health care provider's prescription is necessary.  Emergency contraception Emergency contraceptives prevent pregnancy after unprotected sexual intercourse. This pill can be taken right after sex or up to 5 days after unprotected sex. It is most effective the sooner you take the pills after having sexual intercourse. Most emergency contraceptive pills are available without a prescription. Check with your pharmacist. Do not use emergency contraception as your only form of birth control. BARRIER METHODS   Female condom This is a thin sheath (latex or rubber) that is worn over the penis during sexual intercourse. It can be used with spermicide to increase effectiveness.  Female condom. This is a soft, loose-fitting sheath that is put into the vagina before sexual intercourse.  Diaphragm This is a soft, latex, dome-shaped barrier that must be fitted by a health care provider. It is inserted into the vagina, along with a spermicidal jelly. It is inserted before intercourse. The diaphragm should be left in the vagina for 6 to 8 hours after intercourse.  Cervical cap This is a round, soft, latex or plastic cup that fits over the cervix and must be fitted by a health care provider. The cap can be left in place for up to 48 hours after intercourse.  Sponge This is a soft, circular piece of polyurethane foam. The sponge has spermicide in it. It is inserted into the vagina after wetting it and before sexual intercourse.  Spermicides These are chemicals that kill or block sperm from entering the cervix and uterus. They come in the form of creams, jellies, suppositories, foam, or tablets. They do not require a   prescription. They are inserted into the vagina with an applicator before having sexual intercourse. The process must be repeated every time you have sexual intercourse. INTRAUTERINE CONTRACEPTION  Intrauterine device (IUD) This is a T-shaped device that is put in a woman's uterus during a  menstrual period to prevent pregnancy. There are 2 types:  Copper IUD This type of IUD is wrapped in copper wire and is placed inside the uterus. Copper makes the uterus and fallopian tubes produce a fluid that kills sperm. It can stay in place for 10 years.  Hormone IUD This type of IUD contains the hormone progestin (synthetic progesterone). The hormone thickens the cervical mucus and prevents sperm from entering the uterus, and it also thins the uterine lining to prevent implantation of a fertilized egg. The hormone can weaken or kill the sperm that get into the uterus. It can stay in place for 3 5 years, depending on which type of IUD is used. PERMANENT METHODS OF CONTRACEPTION  Female tubal ligation This is when the woman's fallopian tubes are surgically sealed, tied, or blocked to prevent the egg from traveling to the uterus.  Hysteroscopic sterilization This involves placing a small coil or insert into each fallopian tube. Your doctor uses a technique called hysteroscopy to do the procedure. The device causes scar tissue to form. This results in permanent blockage of the fallopian tubes, so the sperm cannot fertilize the egg. It takes about 3 months after the procedure for the tubes to become blocked. You must use another form of birth control for these 3 months.  Female sterilization This is when the female has the tubes that carry sperm tied off (vasectomy).This blocks sperm from entering the vagina during sexual intercourse. After the procedure, the man can still ejaculate fluid (semen). NATURAL PLANNING METHODS  Natural family planning This is not having sexual intercourse or using a barrier method (condom, diaphragm, cervical cap) on days the woman could become pregnant.  Calendar method This is keeping track of the length of each menstrual cycle and identifying when you are fertile.  Ovulation method This is avoiding sexual intercourse during ovulation.  Symptothermal method This is  avoiding sexual intercourse during ovulation, using a thermometer and ovulation symptoms.  Post ovulation method This is timing sexual intercourse after you have ovulated. Regardless of which type or method of contraception you choose, it is important that you use condoms to protect against the transmission of sexually transmitted infections (STIs). Talk with your health care provider about which form of contraception is most appropriate for you. Document Released: 05/06/2005 Document Revised: 01/06/2013 Document Reviewed: 10/29/2012 [employer] Patient Information 2014 [employer].  

## 2013-05-10 ENCOUNTER — Encounter: Payer: Self-pay | Admitting: Obstetrics & Gynecology

## 2013-05-11 LAB — PAP IG, CT-NG, RFX HPV ASCU: GC Probe Amp: NEGATIVE

## 2013-05-14 ENCOUNTER — Encounter: Payer: Self-pay | Admitting: Obstetrics & Gynecology

## 2013-05-14 DIAGNOSIS — R87613 High grade squamous intraepithelial lesion on cytologic smear of cervix (HGSIL): Secondary | ICD-10-CM | POA: Insufficient documentation

## 2013-06-03 ENCOUNTER — Encounter: Payer: Self-pay | Admitting: Obstetrics & Gynecology

## 2013-06-03 ENCOUNTER — Ambulatory Visit (INDEPENDENT_AMBULATORY_CARE_PROVIDER_SITE_OTHER): Payer: Medicaid Other | Admitting: Obstetrics & Gynecology

## 2013-06-03 ENCOUNTER — Other Ambulatory Visit: Payer: Self-pay

## 2013-06-03 VITALS — BP 128/87 | HR 82 | Temp 98.7°F | Ht 63.0 in | Wt 195.0 lb

## 2013-06-03 DIAGNOSIS — D069 Carcinoma in situ of cervix, unspecified: Secondary | ICD-10-CM

## 2013-06-03 DIAGNOSIS — Z3202 Encounter for pregnancy test, result negative: Secondary | ICD-10-CM

## 2013-06-03 DIAGNOSIS — Z01812 Encounter for preprocedural laboratory examination: Secondary | ICD-10-CM

## 2013-06-03 LAB — POCT URINE PREGNANCY: Preg Test, Ur: NEGATIVE

## 2013-06-03 NOTE — Progress Notes (Signed)
Colposcopy Procedure Note  Indications: Pap smear 1 months ago showed: high-grade squamous intraepithelial neoplasia  (HGSIL-encompassing moderate and severe dysplasia). The prior pap showed no abnormalities.    Procedure Details  The risks and benefits of the procedure and Written informed consent obtained.  A time-out was performed confirming the patient, procedure and allergy status  Speculum placed in vagina and excellent visualization of cervix achieved, cervix swabbed x 3 with acetic acid solution.  Findings: Cervix: punctation noted at 9 and 5  o'clock; endocervical curettage performed and cervical biopsies taken at 9, 5 12 and 3 o'clock.       Specimens: Cervical biopsies/ECC  Complications: none.  Plan: Specimens labelled and sent to Pathology. Will base further treatment on Pathology findings.

## 2013-06-05 NOTE — Patient Instructions (Signed)
Colposcopy, Care After  Refer to this sheet in the next few weeks. These instructions provide you with information on caring for yourself after your procedure. Your health care provider may also give you more specific instructions. Your treatment has been planned according to current medical practices, but problems sometimes occur. Call your health care provider if you have any problems or questions after your procedure.  WHAT TO EXPECT AFTER THE PROCEDURE   After your procedure, it is typical to have the following:  · Cramping. This often goes away in a few minutes.  · Soreness. This may last for 2 days.  · Lightheadedness. Lie down for a few minutes if this occurs.  You may also have some bleeding or dark discharge for a few days. You may need to wear a sanitary pad during this time.  HOME CARE INSTRUCTIONS  · Avoid sex, douching, and using tampons for 3 days or as directed by your health care provider.  · Only take over-the-counter or prescription medicines as directed by your health care provider. Do not take aspirin because it can cause bleeding.  · Continue to take birth control pills if you are on them.  · Not all test results are available during your visit. If your test results are not back during the visit, make an appointment with your health care provider to find out the results. Do not assume everything is normal if you have not heard from your health care provider or the medical facility. It is important for you to follow up on all of your test results.  · Follow your health care provider's advice regarding activity, follow-up visits, and follow-up Pap tests.  SEEK MEDICAL CARE IF:  · You develop a rash.  · You have problems with your medicine.  SEEK IMMEDIATE MEDICAL CARE IF:  · You are bleeding heavily or are passing blood clots.  · You have a fever.  · You have abnormal vaginal discharge.  · You are having cramps that do not go away after taking your pain medicine.  · You feel lightheaded, dizzy, or  faint.  · You have stomach pain.  Document Released: 02/24/2013 Document Reviewed: 12/03/2012  [employer]® Patient Information ©2014 [employer].

## 2013-06-05 NOTE — Progress Notes (Signed)
   Subjective:    Patient ID: Annette Wood, female    DOB: Sep 09, 1990, 23 y.o.   MRN: [id number]  HPI    Review of Systems     Objective:   Physical Exam  Genitourinary:            Assessment & Plan:

## 2013-06-14 ENCOUNTER — Encounter: Payer: Self-pay | Admitting: Obstetrics & Gynecology

## 2013-06-14 DIAGNOSIS — N871 Moderate cervical dysplasia: Secondary | ICD-10-CM | POA: Insufficient documentation

## 2013-06-17 ENCOUNTER — Ambulatory Visit: Payer: Medicaid Other | Admitting: Obstetrics & Gynecology

## 2013-06-18 ENCOUNTER — Encounter: Payer: Self-pay | Admitting: Obstetrics & Gynecology

## 2013-06-18 ENCOUNTER — Ambulatory Visit (INDEPENDENT_AMBULATORY_CARE_PROVIDER_SITE_OTHER): Payer: Medicaid Other | Admitting: Obstetrics & Gynecology

## 2013-06-18 VITALS — BP 130/86 | HR 75 | Temp 98.1°F | Ht 63.0 in | Wt 197.0 lb

## 2013-06-18 DIAGNOSIS — N871 Moderate cervical dysplasia: Secondary | ICD-10-CM

## 2013-06-18 NOTE — Progress Notes (Signed)
Subjective:     Annette Wood is a 23 y.o. female here for a routine exam.  Current complaints: Patient in office today for Nexplanon check and Colpo results. Patient denies any concerns.  Personal health questionnaire reviewed: yes.   Gynecologic History Patient's last menstrual period was 05/24/2013. Contraception: Nexplanon Last Pap: 05/06/2013. Results were: abnormal  Obstetric History OB History  Gravida Para Term Preterm AB SAB TAB Ectopic Multiple Living  3 3 3       3     # Outcome Date GA Lbr Len/2nd Weight Sex Delivery Anes PTL Lv  3 TRM 04/01/13 3019w1d 05:40 / 00:15 7 lb 1.6 oz (3.221 kg) F SVD EPI  Y  2 TRM 09/20/09 2538w0d  7 lb 8 oz (3.402 kg) F SVD EPI N Y  1 TRM 06/09/08 2738w0d  6 lb 3 oz (2.807 kg) M SVD None N Y     Comments: delivered in car       The following portions of the patient's history were reviewed and updated as appropriate: allergies, current medications, past family history, past medical history, past social history, past surgical history and problem list.  Review of Systems Pertinent items are noted in HPI.    Objective:   No exam today  Assessment:   Doing well w/Nexplanon  CIN II Plan:  Needs colposcopy/cytology q 6 mths for a year or treatment

## 2013-06-22 NOTE — Patient Instructions (Signed)
Cervical Dysplasia Cervical dysplasia is a condition in which a woman has abnormal changes in the cells of her cervix. The cervix is the opening to the uterus (womb). It is located between the vagina and the uterus. Cervical dysplasia may be the first sign of cervical cancer.  With early detection, treatment, and close follow-up care, nearly all cases of cervical dysplasia can be cured. If left untreated, dysplasia may become more severe.  CAUSES  Cervical dysplasia can be caused by a human papillomavirus (HPV) infection. RISK FACTORS   Having had a sexually transmitted disease, such as chlamydia or a human papillomavirus (HPV) infection.   Becoming sexually active before age 18.   Having had more than 1 sexual partner.   Not using protection during sexual intercourse, especially with new sexual partners.   Having had cancer of the vagina or vulva.   Having a sexual partner whose previous partner had cancer of the cervix or cervical dysplasia.   Having a sexual partner who has or has had cancer of the penis.   Having a weakened immune system (such as from having HIV or an organ transplant).   Being the daughter of a woman who took diethylstilbestrol(DES) during pregnancy.   Having a family history of cervical cancer.   Smoking. SIGNS AND SYMPTOMS  There are usually no symptoms. If there are symptoms, they may include:   Abnormal vaginal discharge.   Bleeding between periods or after intercourse.   Bleeding during menopause.   Pain during sexual intercourse (dyspareunia). DIAGNOSIS  A test called a Pap test may be done.During this test, cells are taken from the cervix and then looked at under a microscope. A test in which tissue is removed from the cervix (biopsy) may also be done if the Pap test is abnormal or if the cervix looks abnormal.  TREATMENT  Treatment varies based on the severity of the cervical dysplasia. Treatment may include:  Cryotherapy.  During cryotherapy, the abnormal cells are frozen with a steel-tip instrument.   A procedure to remove abnormal tissue from the cervix.  Surgery to remove abnormal tissue. This is usually done in serious cases of cervical dysplasia. Surgical options include:  A cone biopsy. This is a procedure in which the cervical canal and a portion of the center of the cervix are removed.   Hysterectomy. This is a surgery in which the uterus and cervix are removed. HOME CARE INSTRUCTIONS   Only take over-the-counter or prescription medicines for pain or discomfort as directed by your health care provider.   Do not use tampons, have sexual intercourse, or douche until your health care provider says it is OK.  Keep follow-up appointments as directed by your health care provider. Women who have been treated for cervical dysplasia should have regular pelvic exams and Pap tests. During the first year following treatment of cervical dysplasia, Pap tests should be done every 3 4 months. In the second year, they should be done every 6 months or as recommended by your health care provider.  To prevent the condition from developing again, practice safe sex. SEEK MEDICAL CARE IF:  You develop genital warts.  SEEK IMMEDIATE MEDICAL CARE IF:   Your menstrual period is heavier than normal.   You develop bright red bleeding, especially if you have blood clots.   You have a fever.   You have increasing cramps or pain not relieved with medicine.   You are lightheaded, unusually weak, or have fainting spells.   You have   abnormal vaginal discharge.   You have abdominal pain. Document Released: 05/06/2005 Document Revised: 01/06/2013 Document Reviewed: 12/30/2012 [employer] Patient Information 2014 [employer].  

## 2013-06-22 NOTE — Assessment & Plan Note (Signed)
Treatment vs colpo/Pap q 6 mths for 1 yr

## 2013-09-16 ENCOUNTER — Inpatient Hospital Stay (HOSPITAL_COMMUNITY)
Admission: AD | Admit: 2013-09-16 | Discharge: 2013-09-16 | Discharge status: Against Medical Advice | Payer: Medicaid Other | Source: Ambulatory Visit | Attending: Obstetrics | Admitting: Obstetrics

## 2013-09-16 NOTE — MAU Note (Signed)
NOT IN LOBBY 

## 2013-09-16 NOTE — MAU Note (Signed)
Pt not in lobby at this time.

## 2013-09-16 NOTE — MAU Note (Signed)
Pt not in the lobby 

## 2013-09-16 NOTE — MAU Note (Addendum)
NOT IN LOBBY 

## 2013-11-24 ENCOUNTER — Emergency Department (HOSPITAL_COMMUNITY)
Admission: EM | Admit: 2013-11-24 | Discharge: 2013-11-24 | Disposition: A | Discharge status: Home / Self Care | Payer: Medicaid Other | Attending: Emergency Medicine | Admitting: Emergency Medicine

## 2013-11-24 ENCOUNTER — Encounter (HOSPITAL_COMMUNITY): Payer: Self-pay | Admitting: Emergency Medicine

## 2013-11-24 DIAGNOSIS — Z8659 Personal history of other mental and behavioral disorders: Secondary | ICD-10-CM | POA: Insufficient documentation

## 2013-11-24 DIAGNOSIS — K089 Disorder of teeth and supporting structures, unspecified: Secondary | ICD-10-CM | POA: Diagnosis present

## 2013-11-24 DIAGNOSIS — Z8619 Personal history of other infectious and parasitic diseases: Secondary | ICD-10-CM | POA: Diagnosis not present

## 2013-11-24 DIAGNOSIS — K006 Disturbances in tooth eruption: Secondary | ICD-10-CM | POA: Diagnosis not present

## 2013-11-24 DIAGNOSIS — K011 Impacted teeth: Secondary | ICD-10-CM

## 2013-11-24 MED ORDER — HYDROCODONE-ACETAMINOPHEN 5-325 MG PO TABS: 1.0000 | ORAL_TABLET | Freq: Four times a day (QID) | ORAL | Status: DC | PRN

## 2013-11-24 MED ORDER — AMOXICILLIN 500 MG PO CAPS: 500.0000 mg | ORAL_CAPSULE | Freq: Three times a day (TID) | ORAL | Status: DC

## 2013-11-24 NOTE — ED Provider Notes (Signed)
CSN: [id number]     Arrival date & time 11/24/13  0148 History   First MD Initiated Contact with Patient 11/24/13 [phone]     Chief Complaint  Patient presents with  . Dental Pain   HPI  History provided by the patient. Patient is a 23 year old female presenting with complaints of worsening left lower dental pain. Patient reports having slight discomforts intermittently yesterday the swelling had worsened pain through the evening and early morning today. He reports taking some aspirin without any significant relief of pain. Denies any swelling in the mouth. No difficulty breathing or swallowing. No fever, chills or sweats.   Past Medical History  Diagnosis Date  . Gastroesophageal reflux   . Chlamydia   . Gonorrhea   . Panic attack   . Gallstones   . IUP (intrauterine pregnancy), incidental 11/25/2012   Past Surgical History  Procedure Laterality Date  . No past surgeries    . Cholecystectomy N/A 11/24/2012    Procedure: LAPAROSCOPIC CHOLECYSTECTOMY WITH INTRAOPERATIVE CHOLANGIOGRAM;  Surgeon: Mariella SaaBenjamin T Hoxworth, MD;  Location: [facility];  Service: General;  Laterality: N/A;  . Cholecystectomy     Family History  Problem Relation Age of Onset  . Diabetes Maternal Grandmother   . Lupus Maternal Grandmother    History  Substance Use Topics  . Smoking status: Never Smoker   . Smokeless tobacco: Never Used  . Alcohol Use: No   OB History   Grav Para Term Preterm Abortions TAB SAB Ect Mult Living   3 3 3       3      Review of Systems  Constitutional: Negative for fever, chills and diaphoresis.  HENT: Positive for dental problem.   All other systems reviewed and are negative.     Allergies  Review of patient's allergies indicates no known allergies.  Home Medications   Prior to Admission medications   Not on File   BP 103/69  Pulse 92  Temp(Src) 98.9 F (37.2 C) (Oral)  Resp 20  Ht 5\' 3"  (1.6 m)  Wt 210 lb (95.255 kg)  BMI 37.21 kg/m2  SpO2 97%  LMP  11/03/2013 Physical Exam  Nursing note and vitals reviewed. Constitutional: She is oriented to person, place, and time. She appears well-developed and well-nourished. No distress.  HENT:  Head: Normocephalic.  Mouth/Throat:    Impacted left lower third molar with tenderness and mild swelling of the overlying gum. There is slight friability. No signs of concerning dental abscess. No swelling or pain under the tongue.  Neck: Normal range of motion. Neck supple.  Cardiovascular: Normal rate and regular rhythm.   Pulmonary/Chest: Effort normal and breath sounds normal.  Abdominal: Soft.  Lymphadenopathy:    She has no cervical adenopathy.  Neurological: She is alert and oriented to person, place, and time.  Skin: Skin is warm and dry. No rash noted.  Psychiatric: She has a normal mood and affect. Her behavior is normal.    ED Course  Procedures   COORDINATION OF CARE:  Nursing notes reviewed. Vital signs reviewed. Initial pt interview and examination performed.   Filed Vitals:   11/24/13 0218  BP: 103/69  Pulse: 92  Temp: 98.9 F (37.2 C)  TempSrc: Oral  Resp: 20  Height: 5\' 3"  (1.6 m)  Weight: 210 lb (95.255 kg)  SpO2: 97%    3:46 AM-patient seen and evaluated. Patient appears well does not appear in severe pain or discomfort. Patient has impacted left lower third molar with tenderness to  the overlying films. Slight friability. No large area of fluctuance or signs of concerning dental abscess. No pain or swelling under the tongue.  At this time will recommend antibiotics and dental and oral surgery referral.     MDM   Final diagnoses:  Impacted third molar tooth       Angus Sellereter S Jesscia Imm, PA-C 11/24/13 0406

## 2013-11-24 NOTE — Discharge Instructions (Signed)
Please follow up with a dentist or oral surgeon for continued evaluation and treatment.   Impacted Molar Molars are the teeth in the back of your mouth. You have 12 molars. There are 6 molars in each jaw, 3 on each side. When they grow in (erupt) they sometimes cause problems. Molars trapped inside the gum are impacted molars. Impacted molars may grow sideways, tilted, or may only partially emerge. Molars erupt at different times in life. The first set of molars usually erupts around 756 to 22 years of age. The second set of molars typically erupts around 8611 to 22 years of age. The third set of molars are called wisdom teeth. These molars usually do not have enough space to erupt properly. Many teens and young adults develop impacted wisdom teeth and have them surgically removed (extracted). However, any molar or set of molars may become impacted. CAUSES  Teeth that are crowded are often the reason for an impacted molar, but sometimes a cyst or tumor may cause impaction of molars. SYMPTOMS  Sometimes there are no symptoms and an impacted molar is noticed during an exam or X-ray. If there are symptoms they may include:  Pain.  Swelling, redness, or inflammation near the impacted tooth or teeth.  Stiff jaw.  General feeling of illness.  Bad breath.  Gap between the teeth.  Difficulty opening your mouth.  Headache or jaw ache.  Swollen lymph nodes. Impacted teeth may increase the risk of complications such as:  Infection, with possible drainage around the infected area.  Damage to nearby teeth.  Growth of cysts.  Chronic discomfort. DIAGNOSIS  Impacted molars are diagnosed by oral exam and X-rays. TREATMENT  The goal of treatment is to obtain the best possible arrangement of your teeth. Your dentist or orthodontist will recommend the best course of action for you. After an exam, your caregiver may recommend one or a combination of the following treatments.  Supportive home care  to manage pain and other symptoms until treatment can be started.  Surgical extraction of one or a combination of molars to leave room for emerging or later molars. Teeth must be extracted at appropriate times for the best results.  Surgical uncovering of tissue covering the impacted molar.  Orthodontic repositioning with the use of appliances such as elastic or metal separators, braces, wires, springs, and other removable or fixed devices. This is done to guide the molar and surrounding teeth to grow in properly. In some cases, you may need some surgery to assist this procedure. Follow-up orthodontic treatment is often necessary with impacted first and second molars.  Antibiotics to treat infection. HOME CARE INSTRUCTIONS Rinse as directed with an antibacterial solution or salt and warm water. Follow up with your caregiver as directed, even if you do not have symptoms. If you are waiting for treatment and have pain:  Take pain medicines as directed.  Take your antibiotics as directed. Finish them even if you start to feel better.  Put ice on the affected area.  Put ice in a plastic bag.  Place a towel between your skin and the bag.  Leave the ice on for 15-20 minutes, 03-04 times a day. SEEK DENTAL CARE IF:  You have a fever.  Pain emerges, worsens, or is not controlled by the medicines you were given.  Swelling occurs.  You have difficulty opening your mouth or swallowing. MAKE SURE YOU:   Understand these instructions.  Will watch your condition.  Will get help right away if  you are not doing well or get worse. Document Released: 01/02/2011 Document Revised: 07/29/2011 Document Reviewed: 01/02/2011 [facility]: [place] Rhonda WExitCare Patient Information 2015 [place], [place]. This information is not intended to replace advice given to you by your health care provider. Make sure you discuss any questions you have with your health care provider.    Teeth and Gum Care Most problems with teeth and  gums can be prevented if you do the following:  Brush your teeth.  Brush after each meal or at least 3 times a day with a soft bristle tooth brush.  Brush in a circular motion.  Brush close to your gums as well as your teeth.  Brush your back teeth longer than your front teeth.  Floss your teeth once a day. Before bedtime is a good time to floss.  Go to a dentist 2 times a year.  Eat a balanced diet.  Eat good meals including fruits, vegetables, fish, chicken or other meat, and milk products.  Drink 8 to 10 glasses of water a day.  Do not eat candy or snack foods.  Do not  drink sugar-sweetened soft drinks or juice.  If a problem develops, go to your dentist right away.  Common problems are cavities, bleeding gums, sores or lumps in your mouth, or pain in your teeth, gums or jaws.  It is important to see a Dentist right away for these problems because you may lose your teeth or have a lot of pain if you do not go soon.  Take care of your child's teeth.  Brush the teeth with a soft bristle tooth brush.  Do not let your child eat or drink sweet foods and drinks.  Do not let your child fall asleep with a bottle in his or her mouth.  Take your child to a dentist when they are between 122 and 22 years old. This is the age you should start taking your child to a dentist. If your child has problems with their teeth, take them to a dentist sooner.  If a tooth is knocked out (yours or your child's):  Save the tooth if possible.  Wrap it in tissue or put it in a glass of milk.  Take it with you to your dentist right away. Document Released: 02/13/2008 Document Revised: 07/29/2011 Document Reviewed: 02/13/2008 [facility] Patient Information 2015 [place], [place]. This information is not intended to replace advice given to you by your health care provider. Make sure you discuss any questions you have with your health care provider.

## 2013-11-24 NOTE — ED Notes (Signed)
Pt complains of dental pain since last night

## 2013-11-27 IMAGING — US US ABDOMEN COMPLETE
1 series · 13 of 25 positions shown · non-contrast
Comparison: RUQ ultrasound dated 11/22/2012

CLINICAL DATA: 20 weeks pregnant, right upper quadrant pain

COMPLETE ABDOMINAL ULTRASOUND

[Series 1: us abdomen complete · 0.32mm/px · 13 of 73 slices shown]
[im 1/73]
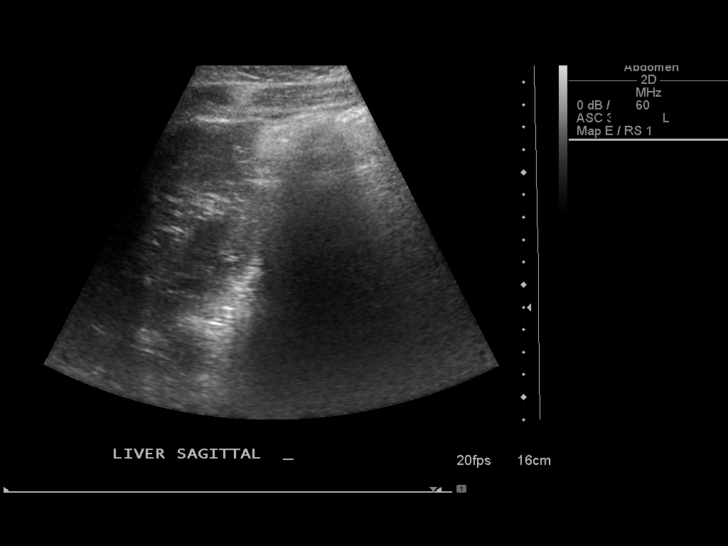
[im 7/73]
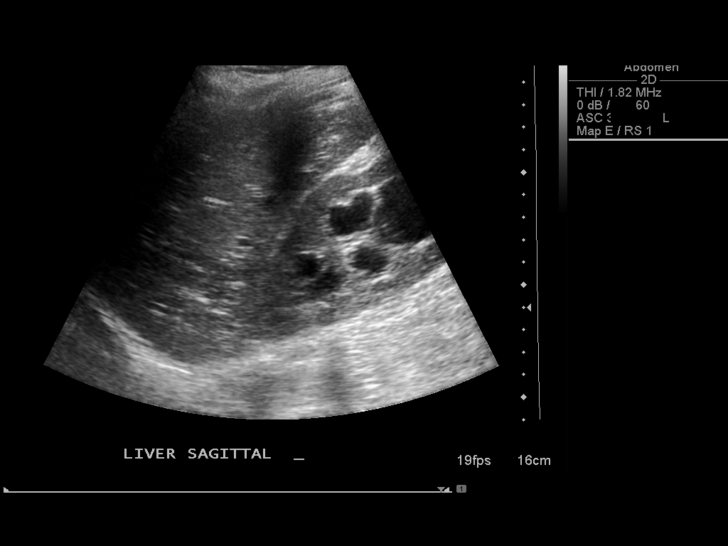
[im 13/73]
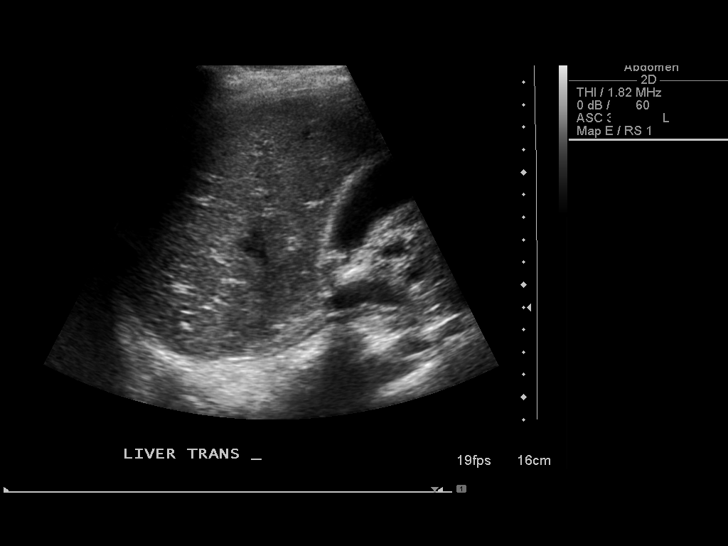
[im 19/73]
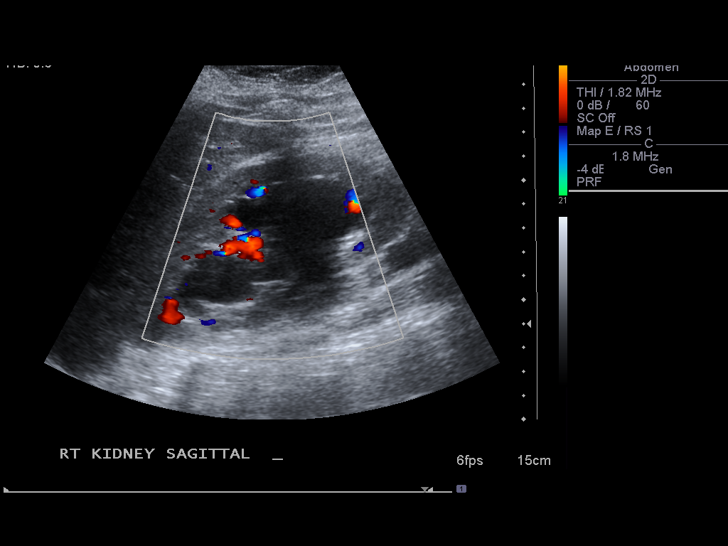
[im 25/73]
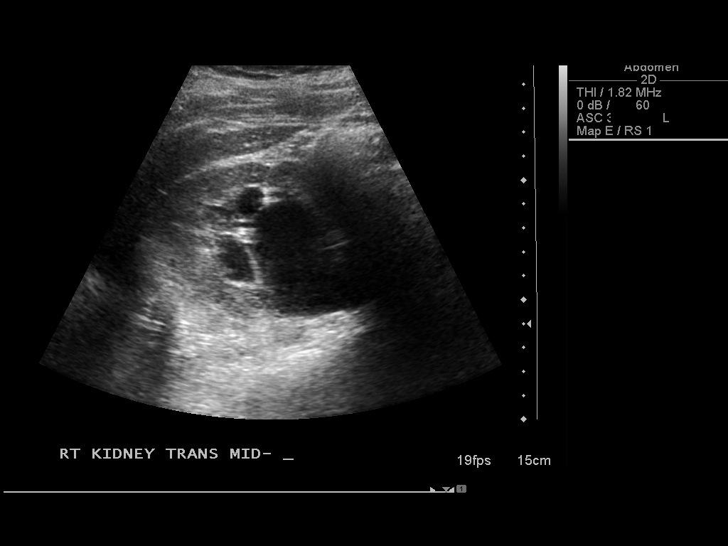
[im 31/73]
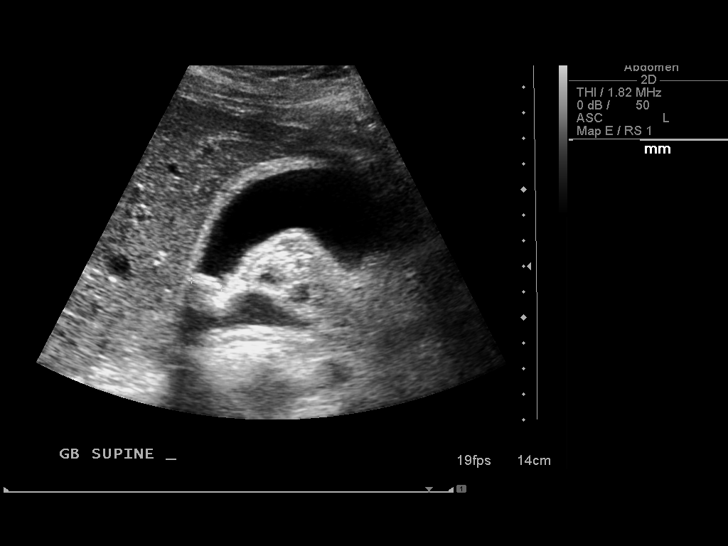
[im 37/73]
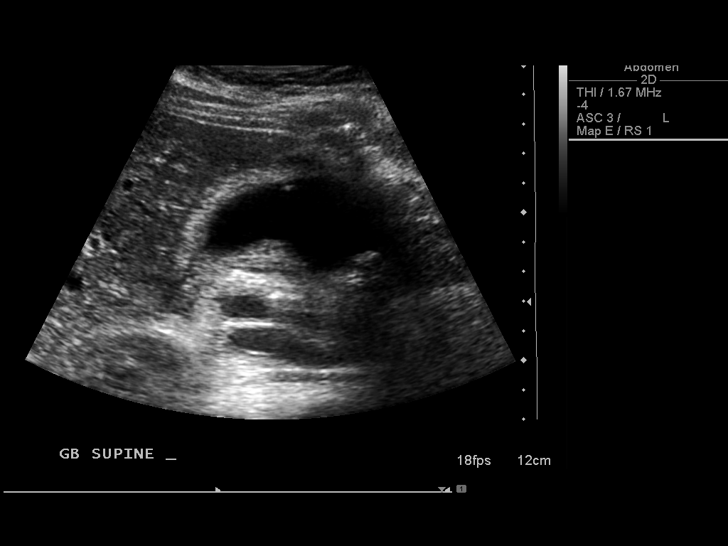
[im 43/73]
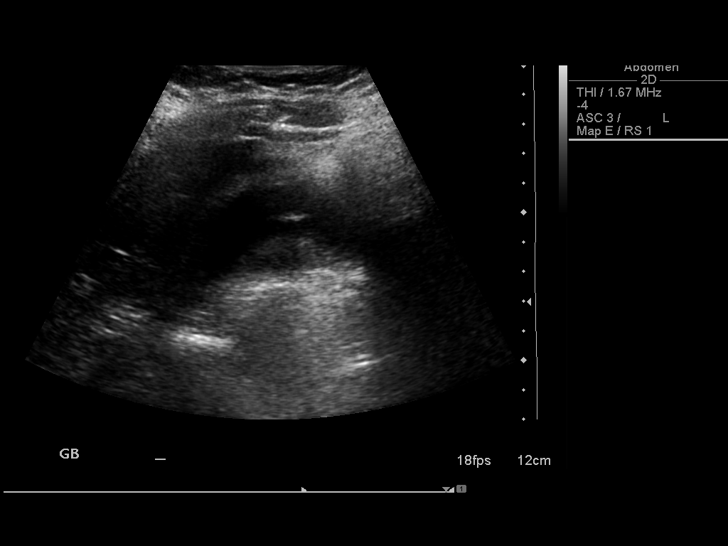
[im 49/73]
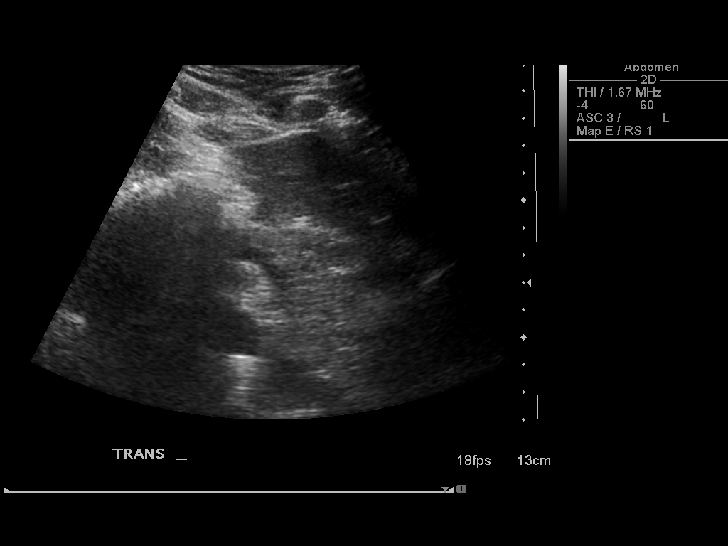
[im 55/73]
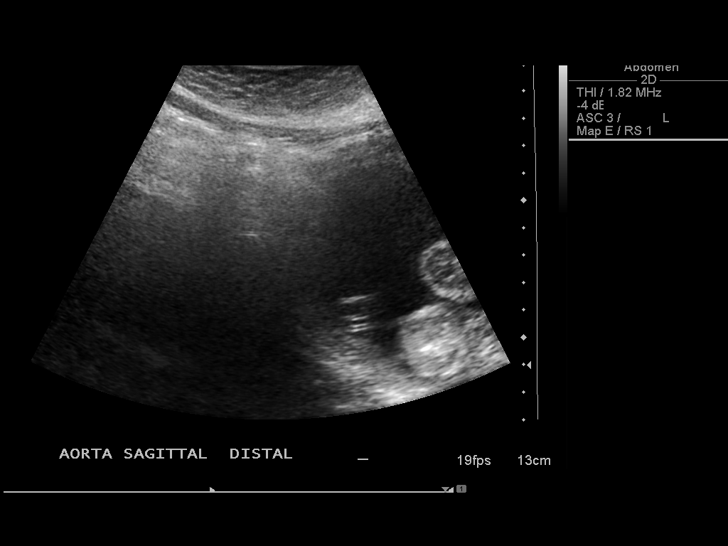
[im 61/73]
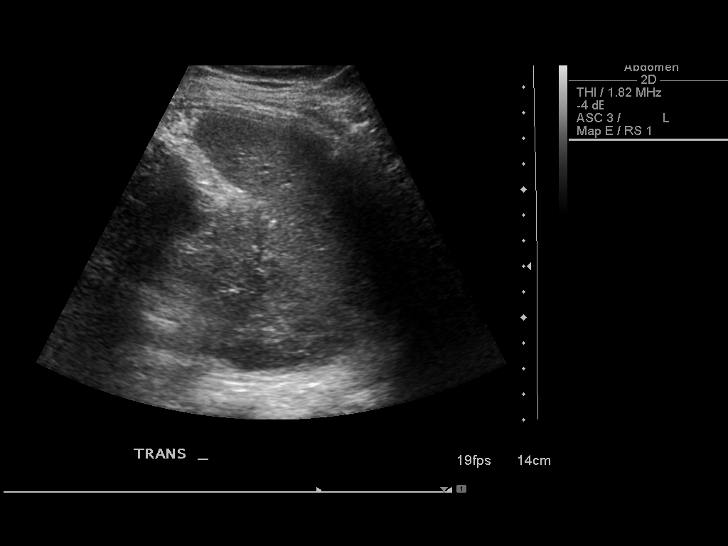
[im 67/73]
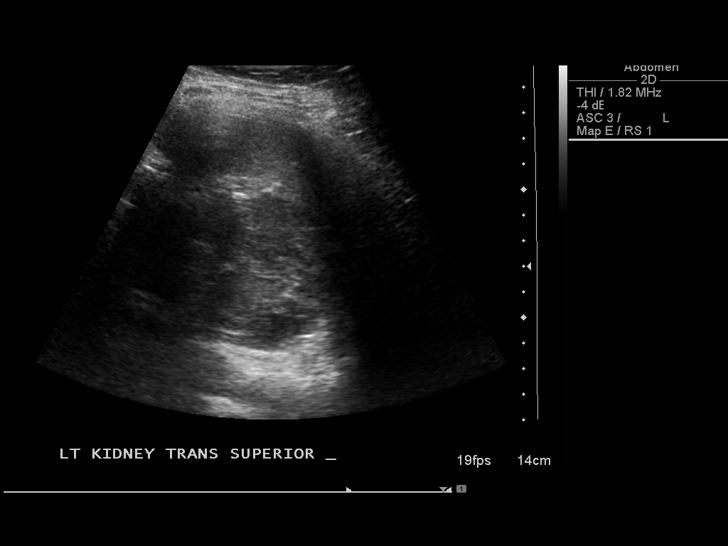
[im 73/73]
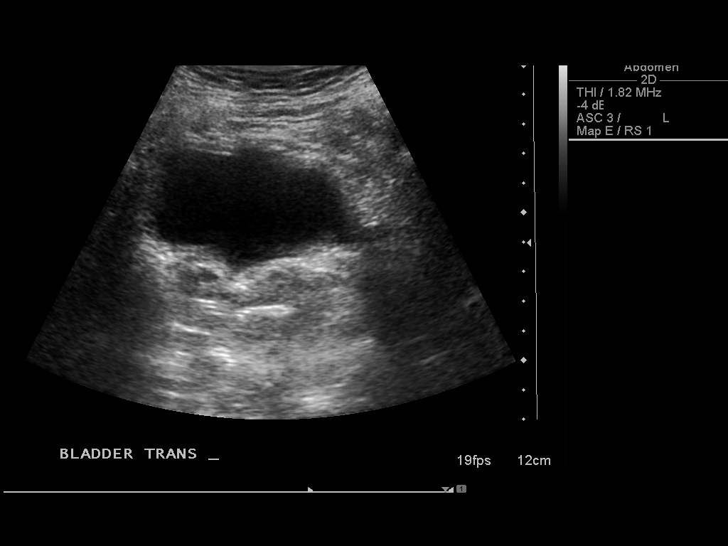

[13 of 25 positions shown; findings below may reference images not displayed]

FINDINGS: Gallbladder:  Cholelithiasis, including a 1.2 cm nonmobile
gallstone in the gallbladder neck.  Mild gallbladder wall
thickening, measuring 6 mm, increased.  Positive sonographic
Murphy's sign.

Common bile duct:  Dilated, measuring 9 mm.  No choledocholithiasis
is seen.

Liver:  No focal lesion identified.  Within normal limits in
parenchymal echogenicity.  Mild intrahepatic ductal dilatation.

IVC:  Appears normal.

Pancreas:  Poorly visualized due to overlying bowel gas.

Spleen:  Measures 7.7 cm.

Right Kidney:  Measures 13.1 cm.  Moderate right hydronephrosis.

Left Kidney:  Measures 12.1 cm.  No mass or hydronephrosis.

Abdominal aorta:  No aneurysm identified.
IMPRESSION: Cholelithiasis with gallbladder wall thickening and positive
sonographic Murphy's sign, suspicious for acute cholecystitis.

Suspected mild intrahepatic/extrahepatic ductal dilatation.  Common
duct measures 9 mm.  No choledocholithiasis is evident by
ultrasound.

Moderate right hydronephrosis.  While the etiology is unclear by
ultrasound, at least some of this may reflect extrinsic compression
related to the gravid uterus.

## 2013-11-27 NOTE — ED Provider Notes (Signed)
Medical screening examination/treatment/procedure(s) were performed by non-physician practitioner and as supervising physician I was immediately available for consultation/collaboration.   EKG Interpretation None        Rolland PorterMark Dodie Parisi, MD 11/27/13 [phone]

## 2014-01-27 ENCOUNTER — Encounter (HOSPITAL_COMMUNITY): Payer: Self-pay | Admitting: Emergency Medicine

## 2014-01-27 ENCOUNTER — Emergency Department (HOSPITAL_COMMUNITY)
Admission: EM | Admit: 2014-01-27 | Discharge: 2014-01-27 | Disposition: A | Discharge status: Home / Self Care | Payer: Medicaid Other | Attending: Emergency Medicine | Admitting: Emergency Medicine

## 2014-01-27 DIAGNOSIS — Z8659 Personal history of other mental and behavioral disorders: Secondary | ICD-10-CM | POA: Diagnosis not present

## 2014-01-27 DIAGNOSIS — Z8719 Personal history of other diseases of the digestive system: Secondary | ICD-10-CM | POA: Diagnosis not present

## 2014-01-27 DIAGNOSIS — K089 Disorder of teeth and supporting structures, unspecified: Secondary | ICD-10-CM | POA: Diagnosis present

## 2014-01-27 DIAGNOSIS — Z8619 Personal history of other infectious and parasitic diseases: Secondary | ICD-10-CM | POA: Insufficient documentation

## 2014-01-27 DIAGNOSIS — K0520 Aggressive periodontitis, unspecified: Secondary | ICD-10-CM | POA: Insufficient documentation

## 2014-01-27 MED ORDER — HYDROCODONE-ACETAMINOPHEN 5-325 MG PO TABS
1.0000 | ORAL_TABLET | Freq: Once | ORAL | Status: AC
  Administered 2014-01-27: 1 via ORAL
  Filled 2014-01-27: qty 1

## 2014-01-27 MED ORDER — AMOXICILLIN 500 MG PO CAPS
500.0000 mg | ORAL_CAPSULE | Freq: Once | ORAL | Status: AC
  Administered 2014-01-27: 500 mg via ORAL
  Filled 2014-01-27: qty 1

## 2014-01-27 MED ORDER — NAPROXEN 500 MG PO TABS: 500.0000 mg | ORAL_TABLET | Freq: Two times a day (BID) | ORAL | Status: DC

## 2014-01-27 MED ORDER — AMOXICILLIN 500 MG PO CAPS: 500.0000 mg | ORAL_CAPSULE | Freq: Three times a day (TID) | ORAL | Status: DC

## 2014-01-27 MED ORDER — HYDROCODONE-ACETAMINOPHEN 5-325 MG PO TABS: 1.0000 | ORAL_TABLET | Freq: Four times a day (QID) | ORAL | Status: DC | PRN

## 2014-01-27 MED ORDER — IBUPROFEN 400 MG PO TABS
800.0000 mg | ORAL_TABLET | Freq: Once | ORAL | Status: AC
  Administered 2014-01-27: 800 mg via ORAL
  Filled 2014-01-27: qty 2

## 2014-01-27 NOTE — Discharge Instructions (Signed)
Follow-up with a dentist as soon as possible.

## 2014-01-27 NOTE — ED Provider Notes (Signed)
CSN: [id number]     Arrival date & time 01/27/14  1930 History  This chart was scribed for non-physician practitioner, Janne Napoleon, NP, working with Layla Maw Ward, DO, by Bronson Curb, ED Scribe. This patient was seen in room TR11C/TR11C and the patient's care was started at 9:34 PM.    Chief Complaint  Patient presents with  . Dental Pain     Patient is a 23 y.o. female presenting with tooth pain. The history is provided by the patient.  Dental Pain Location:  Lower Quality:  Constant Severity:  Moderate Duration:  6 months Timing:  Intermittent Progression:  Worsening Ineffective treatments:  NSAIDs Associated symptoms: no facial pain, no facial swelling and no fever   Risk factors: no diabetes     HPI Comments: Annette Wood is a 23 y.o. female who presents to the Emergency Department complaining of intermittent left, lower dental pain (left lower molar) for over 6 months. She reports the pain radiates to her left ear. Patient states she usually takes ibuprofen and applies pressure with relief, however, she states this treatment is now ineffective. She denies fever or chills. Patient has history of chlamydia, gonorrhea, panic attack, and gallstones.  Past Medical History  Diagnosis Date  . Gastroesophageal reflux   . Chlamydia   . Gonorrhea   . Panic attack   . Gallstones   . IUP (intrauterine pregnancy), incidental 11/25/2012   Past Surgical History  Procedure Laterality Date  . No past surgeries    . Cholecystectomy N/A 11/24/2012    Procedure: LAPAROSCOPIC CHOLECYSTECTOMY WITH INTRAOPERATIVE CHOLANGIOGRAM;  Surgeon: Mariella Saa, MD;  Location: [facility];  Service: General;  Laterality: N/A;  . Cholecystectomy     Family History  Problem Relation Age of Onset  . Diabetes Maternal Grandmother   . Lupus Maternal Grandmother    History  Substance Use Topics  . Smoking status: Never Smoker   . Smokeless tobacco: Never Used  . Alcohol Use: No   OB History    Grav Para Term Preterm Abortions TAB SAB Ect Mult Living   Review of Systems  Constitutional: Negative for fever.  HENT: Positive for dental problem. Negative for facial swelling.   All other systems negative    Allergies  Review of patient's allergies indicates no known allergies.  Home Medications   Prior to Admission medications   Not on File   Triage Vitals: BP 120/70  Pulse 81  Temp(Src) 98.9 F (37.2 C) (Oral)  Resp 14  Ht  (1.676 m)  Wt 211 lb (95.709 kg)  BMI 34.07 kg/m2  SpO2 100%  LMP 01/26/2014  Physical Exam  Nursing note and vitals reviewed. Constitutional: She is oriented to person, place, and time. She appears well-developed and well-nourished.  HENT:  Head: Normocephalic.  Right Ear: Tympanic membrane normal.  Left Ear: Tympanic membrane normal.  Mouth/Throat: Uvula is midline, oropharynx is clear and moist and mucous membranes are normal.     left lower wisdom tooth partially erupted. The gum surrounding the tooth is swollen with erythema and tender on exam.   Eyes: EOM are normal.  Neck: Neck supple.  Pulmonary/Chest: Effort normal.  Abdominal: Soft. There is no tenderness.  Musculoskeletal: Normal range of motion.  Neurological: She is alert and oriented to person, place, and time. No cranial nerve deficit.  Skin: Skin is warm and dry.    ED Course  Procedures (including critical care time)  DIAGNOSTIC STUDIES: Oxygen Saturation is 100% on room air, normal by my interpretation.    COORDINATION OF CARE: At 2139 Discussed treatment plan with patient which includes Norco, Naprosyn, and amoxicillin. Patient agrees.    MDM  23 y.o. female with dental pain to the lower left third molar. Will treat for pain and infection. She will follow up with a dentist as soon as possible. She will return here if needed. Stable for discharge without difficulty swallowing, fever, facial swelling or signs of sepsis. Discussed with the  patient and all questioned fully answered.   Medication List         amoxicillin 500 MG capsule  Commonly known as:  AMOXIL  Take 1 capsule (500 mg total) by mouth 3 (three) times daily.     HYDROcodone-acetaminophen 5-325 MG per tablet  Commonly known as:  NORCO  Take 1 tablet by mouth every 6 (six) hours as needed for moderate pain.     naproxen 500 MG tablet  Commonly known as:  NAPROSYN  Take 1 tablet (500 mg total) by mouth 2 (two) times daily.        I personally performed the services described in this documentation, which was scribed in my presence. The recorded information has been reviewed and is accurate.   Janne Napoleon, [place] 01/28/14 1529

## 2014-01-27 NOTE — ED Notes (Signed)
Pt. reports left lower molar pain onset today .  

## 2014-01-31 NOTE — ED Provider Notes (Signed)
Medical screening examination/treatment/procedure(s) were performed by non-physician practitioner and as supervising physician I was immediately available for consultation/collaboration.   EKG Interpretation None        Brynnly Bonet N Lenda Baratta, DO 01/31/14 1010 

## 2014-03-21 ENCOUNTER — Encounter (HOSPITAL_COMMUNITY): Payer: Self-pay | Admitting: Emergency Medicine

## 2014-03-25 ENCOUNTER — Emergency Department (HOSPITAL_COMMUNITY)
Admission: EM | Admit: 2014-03-25 | Discharge: 2014-03-25 | Disposition: A | Discharge status: Home / Self Care | Payer: Medicaid Other | Attending: Emergency Medicine | Admitting: Emergency Medicine

## 2014-03-25 ENCOUNTER — Encounter (HOSPITAL_COMMUNITY): Payer: Self-pay

## 2014-03-25 DIAGNOSIS — Z792 Long term (current) use of antibiotics: Secondary | ICD-10-CM | POA: Diagnosis not present

## 2014-03-25 DIAGNOSIS — Y9289 Other specified places as the place of occurrence of the external cause: Secondary | ICD-10-CM | POA: Insufficient documentation

## 2014-03-25 DIAGNOSIS — Z8719 Personal history of other diseases of the digestive system: Secondary | ICD-10-CM | POA: Insufficient documentation

## 2014-03-25 DIAGNOSIS — Z202 Contact with and (suspected) exposure to infections with a predominantly sexual mode of transmission: Secondary | ICD-10-CM | POA: Insufficient documentation

## 2014-03-25 DIAGNOSIS — T23269A Burn of second degree of back of unspecified hand, initial encounter: Secondary | ICD-10-CM | POA: Insufficient documentation

## 2014-03-25 DIAGNOSIS — X12XXXA Contact with other hot fluids, initial encounter: Secondary | ICD-10-CM | POA: Insufficient documentation

## 2014-03-25 DIAGNOSIS — T24219A Burn of second degree of unspecified thigh, initial encounter: Secondary | ICD-10-CM | POA: Diagnosis not present

## 2014-03-25 DIAGNOSIS — Z8659 Personal history of other mental and behavioral disorders: Secondary | ICD-10-CM | POA: Insufficient documentation

## 2014-03-25 DIAGNOSIS — T300 Burn of unspecified body region, unspecified degree: Secondary | ICD-10-CM

## 2014-03-25 DIAGNOSIS — Y9389 Activity, other specified: Secondary | ICD-10-CM | POA: Insufficient documentation

## 2014-03-25 MED ORDER — HYDROMORPHONE HCL 1 MG/ML IJ SOLN
1.0000 mg | Freq: Once | INTRAMUSCULAR | Status: AC
  Administered 2014-03-25: 1 mg via INTRAVENOUS
  Filled 2014-03-25: qty 1

## 2014-03-25 MED ORDER — SILVER SULFADIAZINE 1 % EX CREA
TOPICAL_CREAM | Freq: Once | CUTANEOUS | Status: AC
  Administered 2014-03-25: 19:00:00 via TOPICAL
  Filled 2014-03-25: qty 85

## 2014-03-25 MED ORDER — OXYCODONE-ACETAMINOPHEN 5-325 MG PO TABS: 1.0000 | ORAL_TABLET | ORAL | Status: DC | PRN

## 2014-03-25 MED ORDER — SODIUM CHLORIDE 0.9 % IV BOLUS (SEPSIS)
1000.0000 mL | Freq: Once | INTRAVENOUS | Status: AC
  Administered 2014-03-25: 1000 mL via INTRAVENOUS
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
## 2014-03-25 NOTE — Discharge Instructions (Signed)
Read the information below.  Use the prescribed medication as directed.  Please discuss all new medications with your pharmacist.  Do not take additional tylenol while taking the prescribed pain medication to avoid overdose.  You may return to the Emergency Department at any time for worsening condition or any new symptoms that concern you.  If there is any possibility that you might be pregnant, please let your health care provider know and discuss this with the pharmacist to ensure medication safety.  If you develop increased redness, swelling, uncontrolled pain, pus draining from the wound, or fevers greater than 100.4, return to the ER immediately for a recheck.     Burn Care Your skin is a natural barrier to infection. It is the largest organ of your body. Burns damage this natural protection. To help prevent infection, it is very important to follow your caregiver's instructions in the care of your burn. Burns are classified as:  First degree. There is only redness of the skin (erythema). No scarring is expected.  Second degree. There is blistering of the skin. Scarring may occur with deeper burns.  Third degree. All layers of the skin are injured, and scarring is expected. HOME CARE INSTRUCTIONS   Wash your hands well before changing your bandage.  Change your bandage as often as directed by your caregiver.  Remove the old bandage. If the bandage sticks, you may soak it off with cool, clean water.  Cleanse the burn thoroughly but gently with mild soap and water.  Pat the area dry with a clean, dry cloth.  Apply a thin layer of antibacterial cream to the burn.  Apply a clean bandage as instructed by your caregiver.  Keep the bandage as clean and dry as possible.  Elevate the affected area for the first 24 hours, then as instructed by your caregiver.  Only take over-the-counter or prescription medicines for pain, discomfort, or fever as directed by your caregiver. SEEK IMMEDIATE  MEDICAL CARE IF:   You develop excessive pain.  You develop redness, tenderness, swelling, or red streaks near the burn.  The burned area develops yellowish-white fluid (pus) or a bad smell.  You have a fever. MAKE SURE YOU:   Understand these instructions.  Will watch your condition.  Will get help right away if you are not doing well or get worse. Document Released: 05/06/2005 Document Revised: 07/29/2011 Document Reviewed: 09/26/2010 [facility] Patient Information 2015 [employer] [place]. This information is not intended to replace advice given to you by your health care provider. Make sure you discuss any questions you have with your health care provider.
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
## 2014-04-11 ENCOUNTER — Inpatient Hospital Stay (HOSPITAL_COMMUNITY)
Admission: AD | Admit: 2014-04-11 | Discharge: 2014-04-12 | Disposition: A | Discharge status: Home / Self Care | Payer: Medicaid Other | Source: Ambulatory Visit | Attending: Obstetrics & Gynecology | Admitting: Obstetrics & Gynecology

## 2014-04-11 DIAGNOSIS — Z113 Encounter for screening for infections with a predominantly sexual mode of transmission: Secondary | ICD-10-CM | POA: Insufficient documentation

## 2014-04-12 ENCOUNTER — Encounter (HOSPITAL_COMMUNITY): Payer: Self-pay | Admitting: *Deleted

## 2014-04-12 DIAGNOSIS — Z113 Encounter for screening for infections with a predominantly sexual mode of transmission: Secondary | ICD-10-CM

## 2014-04-12 LAB — URINALYSIS, ROUTINE W REFLEX MICROSCOPIC
BILIRUBIN URINE: NEGATIVE
GLUCOSE, UA: NEGATIVE mg/dL
HGB URINE DIPSTICK: NEGATIVE
Ketones, ur: 15 mg/dL — AB
Leukocytes, UA: NEGATIVE
Nitrite: NEGATIVE
PH: 6 (ref 5.0–8.0)
Protein, ur: NEGATIVE mg/dL
Specific Gravity, Urine: >1.03 — ABNORMAL HIGH (ref 1.005–1.030)
UROBILINOGEN UA: 0.2 mg/dL (ref 0.0–1.0)

## 2014-04-12 LAB — WET PREP, GENITAL
TRICH WET PREP: NONE SEEN
Yeast Wet Prep HPF POC: NONE SEEN

## 2014-04-12 LAB — GC/CHLAMYDIA PROBE AMP
CT PROBE, AMP APTIMA: NEGATIVE
GC PROBE AMP APTIMA: NEGATIVE

## 2014-04-12 MED ORDER — CEFTRIAXONE SODIUM 250 MG IJ SOLR
250.0000 mg | Freq: Once | INTRAMUSCULAR | Status: AC
  Administered 2014-04-12: 250 mg via INTRAMUSCULAR
  Filled 2014-04-12: qty 250

## 2014-04-12 MED ORDER — AZITHROMYCIN 250 MG PO TABS
1000.0000 mg | ORAL_TABLET | Freq: Once | ORAL | Status: AC
  Administered 2014-04-12: 1000 mg via ORAL
  Filled 2014-04-12: qty 4

## 2014-04-12 NOTE — Discharge Instructions (Signed)
Gonorrhea °Gonorrhea is an infection that can cause serious problems. If left untreated, the infection may:  °· Damage the female or female organs.   °· Cause women to be unable to have children (sterility).   °· Harm a fetus if the infected woman is pregnant.   °It is important to get treatment for gonorrhea as soon as possible. It is also necessary that all your sexual partners be tested for the infection.  °CAUSES  °Gonorrhea is caused by bacteria called Neisseria gonorrhoeae. The infection is spread from person to person, usually by sexual contact (such as by anal, vaginal, or oral means). A newborn can contract the infection from his or her mother during birth.  °SYMPTOMS  °Some people with gonorrhea do not have symptoms. Symptoms may be different in females and males.  °Females  °The most common symptoms are:  °· Pain in the lower abdomen.   °· Fever with or without chills.   °Other symptoms include:  °· Abnormal vaginal discharge.   °· Painful intercourse.   °· Burning or itching of the vagina or lips of the vagina.   °· Abnormal vaginal bleeding.   °· Pain when urinating.   °· Long-lasting (chronic) pain in the lower abdomen, especially during menstruation or intercourse.   °· Inability to become pregnant.   °· Going into premature labor.   °· Irritation, pain, bleeding, or discharge from the rectum. This may occur if the infection was spread by anal sex.   °· Sore throat or swollen lymph nodes in the neck. This may occur if the infection was spread by oral sex.   °Males  °The most common symptoms are:  °· Discharge from the penis.   °· Pain or burning during urination.   °· Pain or swelling in the testicles. °Other symptoms may include:  °· Irritation, pain, bleeding, or discharge from the rectum. This may occur if the infection was spread by anal sex.   °· Sore throat, fever, or swollen lymph nodes in the neck. This may occur if the infection was spread by oral sex.   °DIAGNOSIS  °A diagnosis is made after a  physical exam is done and a sample of discharge is examined under a microscope for the presence of the bacteria. The discharge may be taken from the urethra, cervix, throat, or rectum.  °TREATMENT  °Gonorrhea is treated with antibiotic medicines. It is important for treatment to begin as soon as possible. Early treatment may prevent some problems from developing.  °HOME CARE INSTRUCTIONS  °· Take medicines only as directed by your health care provider.   °· Take your antibiotic medicine as directed by your health care provider. Finish the antibiotic even if you start to feel better. Incomplete treatment will put you at risk for continued infection.   °· Do not have sex until treatment is complete or as directed by your health care provider.   °· Keep all follow-up visits as directed by your health care provider.   °· Not all test results are available during your visit. If your test results are not back during the visit, make an appointment with your health care provider to find out the results. Do not assume everything is normal if you have not heard from your health care provider or the medical facility. It is your responsibility to get your test results. °· If you test positive for gonorrhea, inform your recent sexual partners. They need to be checked for gonorrhea even if they do not have symptoms. They may need treatment, even if they test negative for gonorrhea.   °SEEK MEDICAL CARE IF:  °· You develop any bad reaction   to the medicine you were prescribed. This may include:   °¨ A rash.   °¨ Nausea.   °¨ Vomiting.   °¨ Diarrhea.   °· Your symptoms do not improve after a few days of taking antibiotics.   °· Your symptoms get worse.   °· You develop increased pain, such as in the testicles (for males) or in the abdomen (for females). °· You have a fever. °MAKE SURE YOU:  °· Understand these instructions. °· Will watch your condition. °· Will get help right away if you are not doing well or get worse. °Document  Released: 05/03/2000 Document Revised: 09/20/2013 Document Reviewed: 11/11/2012 °ExitCare® Patient Information ©2015 [employer]. This information is not intended to replace advice given to you by your health care provider. Make sure you discuss any questions you have with your health care provider. ° °Chlamydia °Chlamydia is an infection. It is spread through sexual contact. Chlamydia can be in different areas of the body. These areas include the cervix, urethra, throat, or rectum. You may not know you have chlamydia because many people never develop the symptoms. Chlamydia is not difficult to treat once you know you have it. However, if it is left untreated, chlamydia can lead to more serious health problems.  °CAUSES  °Chlamydia is caused by bacteria. It is a sexually transmitted disease. It is passed from an infected partner during intimate contact. This contact could be with the genitals, mouth, or rectal area. Chlamydia can also be passed from mothers to babies during birth. °SIGNS AND SYMPTOMS  °There may not be any symptoms. This is often the case early in the infection. If symptoms develop, they may include: °· Mild pain and discomfort when urinating. °· Redness, soreness, and swelling (inflammation) of the rectum. °· Vaginal discharge. °· Painful intercourse. °· Abdominal pain. °· Bleeding between menstrual periods. °DIAGNOSIS  °To diagnose this infection, your health care provider will do a pelvic exam. Cultures will be taken of the vagina, cervix, urine, and possibly the rectum to verify the diagnosis.  °TREATMENT °You will be given antibiotic medicines. If you are pregnant, certain types of antibiotics will need to be avoided. Any sexual partners should also be treated, even if they do not show symptoms.  °HOME CARE INSTRUCTIONS  °· Take your antibiotic medicine as directed by your health care provider. Finish the antibiotic even if you start to feel better. °· Take medicines only as directed by your  health care provider. °· Inform any sexual partners about the infection. They should also be treated. °· Do not have sexual contact until your health care provider tells you it is okay. °· Get plenty of rest. °· Eat a well-balanced diet. °· Drink enough fluids to keep your urine clear or pale yellow. °· Keep all follow-up visits as directed by your health care provider. °SEEK MEDICAL CARE IF: °· You have painful urination. °· You have abdominal pain. °· You have vaginal discharge. °· You have painful sexual intercourse. °· You have bleeding between periods and after sex. °· You have a fever. °SEEK IMMEDIATE MEDICAL CARE IF:  °· You experience nausea or vomiting. °· You experience excessive sweating (diaphoresis). °· You have difficulty swallowing. °MAKE SURE YOU:  °· Understand these instructions. °· Will watch your condition. °· Will get help right away if you are not doing well or get worse. °Document Released: 02/13/2005 Document Revised: 09/20/2013 Document Reviewed: 01/11/2013 °ExitCare® Patient Information ©2015 [employer] LLC. This information is not intended to replace advice given to you by your health care provider.   Make sure you discuss any questions you have with your health care provider. ° ° °
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
## 2014-05-04 ENCOUNTER — Telehealth: Payer: Self-pay | Admitting: *Deleted

## 2014-05-04 NOTE — Telephone Encounter (Signed)
Patient called the office stating she currently has the Nexplanon and would like to switch her birth control.  Attempted to contact the patient and left a message for patient to call the office.

## 2014-05-16 ENCOUNTER — Encounter: Payer: Self-pay | Admitting: *Deleted

## 2014-05-17 ENCOUNTER — Encounter (HOSPITAL_COMMUNITY): Payer: Self-pay | Admitting: Family

## 2014-05-17 ENCOUNTER — Inpatient Hospital Stay (HOSPITAL_COMMUNITY)
Admission: AD | Admit: 2014-05-17 | Discharge: 2014-05-17 | Disposition: A | Discharge status: Home / Self Care | Payer: Medicaid Other | Source: Ambulatory Visit | Attending: Obstetrics | Admitting: Obstetrics

## 2014-05-17 DIAGNOSIS — N760 Acute vaginitis: Secondary | ICD-10-CM | POA: Insufficient documentation

## 2014-05-17 DIAGNOSIS — Z202 Contact with and (suspected) exposure to infections with a predominantly sexual mode of transmission: Secondary | ICD-10-CM | POA: Diagnosis present

## 2014-05-17 DIAGNOSIS — R8271 Bacteriuria: Secondary | ICD-10-CM

## 2014-05-17 DIAGNOSIS — B9689 Other specified bacterial agents as the cause of diseases classified elsewhere: Secondary | ICD-10-CM | POA: Diagnosis not present

## 2014-05-17 DIAGNOSIS — A499 Bacterial infection, unspecified: Secondary | ICD-10-CM

## 2014-05-17 LAB — URINALYSIS, ROUTINE W REFLEX MICROSCOPIC
BILIRUBIN URINE: NEGATIVE
Glucose, UA: NEGATIVE mg/dL
Hgb urine dipstick: NEGATIVE
Ketones, ur: NEGATIVE mg/dL
Nitrite: NEGATIVE
PH: 6 (ref 5.0–8.0)
Protein, ur: NEGATIVE mg/dL
Specific Gravity, Urine: 1.025 (ref 1.005–1.030)
UROBILINOGEN UA: 0.2 mg/dL (ref 0.0–1.0)

## 2014-05-17 LAB — POCT PREGNANCY, URINE: Preg Test, Ur: NEGATIVE

## 2014-05-17 LAB — URINE MICROSCOPIC-ADD ON

## 2014-05-17 LAB — WET PREP, GENITAL
TRICH WET PREP: NONE SEEN
Yeast Wet Prep HPF POC: NONE SEEN

## 2014-05-17 LAB — HIV ANTIBODY (ROUTINE TESTING W REFLEX): HIV 1&2 Ab, 4th Generation: NONREACTIVE

## 2014-05-17 LAB — RPR

## 2014-05-17 MED ORDER — METRONIDAZOLE 500 MG PO TABS: 500.0000 mg | ORAL_TABLET | Freq: Two times a day (BID) | ORAL | Status: DC

## 2014-05-17 NOTE — MAU Provider Note (Signed)
History     CSN: [id number]  Arrival date and time: 05/17/14 0907   First Provider Initiated Contact with Patient 05/17/14 [phone]      Chief Complaint  Patient presents with  . Exposure to STD   HPI  Pt is 23 yo female here with desire to screen for sexually transmitted infections.  With boyfriend of two months three days ago and the condom broke.   No current symptoms, "just want to make sure".  LMP 04/19/14.  Current family planning method Implanon x 1 year and condoms.    Past Medical History  Diagnosis Date  . Gastroesophageal reflux   . Chlamydia   . Gonorrhea   . Panic attack   . Gallstones     Past Surgical History  Procedure Laterality Date  . Cholecystectomy N/A 11/24/2012    Procedure: LAPAROSCOPIC CHOLECYSTECTOMY WITH INTRAOPERATIVE CHOLANGIOGRAM;  Surgeon: Mariella SaaBenjamin T Hoxworth, MD;  Location: [facility];  Service: General;  Laterality: N/A;  . Cholecystectomy      Family History  Problem Relation Age of Onset  . Diabetes Maternal Grandmother   . Lupus Maternal Grandmother     History  Substance Use Topics  . Smoking status: Never Smoker   . Smokeless tobacco: Never Used  . Alcohol Use: No    Allergies: No Known Allergies  Prescriptions prior to admission  Medication Sig Dispense Refill Last Dose  . amoxicillin (AMOXIL) 500 MG capsule Take 1 capsule (500 mg total) by mouth 3 (three) times daily. (Patient not taking: Reported on 05/17/2014) 21 capsule 0   . HYDROcodone-acetaminophen (NORCO) 5-325 MG per tablet Take 1 tablet by mouth every 6 (six) hours as needed for moderate pain. 15 tablet 0 prn  . oxyCODONE-acetaminophen (PERCOCET/ROXICET) 5-325 MG per tablet Take 1-2 tablets by mouth every 4 (four) hours as needed for moderate pain or severe pain. 20 tablet 0 prn    ROS  Pertinent info in HPI  Physical Exam   There were no vitals taken for this visit.  Physical Exam  Constitutional: She is oriented to person, place, and time. She appears  well-developed and well-nourished. No distress.  HENT:  Head: Normocephalic.  Neck: Normal range of motion. Neck supple.  Cardiovascular: Normal rate, regular rhythm and normal heart sounds.   Respiratory: Effort normal and breath sounds normal. No respiratory distress.  GI: Soft. There is no tenderness. There is no CVA tenderness.  Genitourinary: Cervix exhibits no motion tenderness and no discharge. Vaginal discharge (white, creamy) found.  Musculoskeletal: Normal range of motion.  Neurological: She is alert and oriented to person, place, and time.  Skin: Skin is warm and dry.  Psychiatric: She has a normal mood and affect.    MAU Course  Procedures  Results for orders placed or performed during the hospital encounter of 05/17/14 (from the past 24 hour(s))  Urinalysis, Routine w reflex microscopic     Status: Abnormal   Collection Time: 05/17/14  9:43 AM  Result Value Ref Range   Color, Urine YELLOW YELLOW   APPearance CLOUDY (A) CLEAR   Specific Gravity, Urine 1.025 1.005 - 1.030   pH 6.0 5.0 - 8.0   Glucose, UA NEGATIVE NEGATIVE mg/dL   Hgb urine dipstick NEGATIVE NEGATIVE   Bilirubin Urine NEGATIVE NEGATIVE   Ketones, ur NEGATIVE NEGATIVE mg/dL   Protein, ur NEGATIVE NEGATIVE mg/dL   Urobilinogen, UA 0.2 0.0 - 1.0 mg/dL   Nitrite NEGATIVE NEGATIVE   Leukocytes, UA MODERATE (A) NEGATIVE  Urine microscopic-add on  Status: Abnormal   Collection Time: 05/17/14  9:43 AM  Result Value Ref Range   Squamous Epithelial / LPF MANY (A) RARE   WBC, UA 3-6 <3 WBC/hpf   Bacteria, UA MANY (A) RARE  Pregnancy, urine POC     Status: None   Collection Time: 05/17/14  9:45 AM  Result Value Ref Range   Preg Test, Ur NEGATIVE NEGATIVE  Wet prep, genital     Status: Abnormal   Collection Time: 05/17/14 10:01 AM  Result Value Ref Range   Yeast Wet Prep HPF POC NONE SEEN NONE SEEN   Trich, Wet Prep NONE SEEN NONE SEEN   Clue Cells Wet Prep HPF POC FEW (A) NONE SEEN   WBC, Wet Prep  HPF POC MANY (A) NONE SEEN     Assessment and Plan  Bacterial Vaginosis STD Screen  Plan: RX Flagyl 500 mg BID x 7 days GC/CT pending HIV and RPR pending Explained need to be screened again in 2-3 weeks Urine culture sent  Marlis EdelsonKARIM, Valine Drozdowski N 05/17/2014, 9:33 AM
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
## 2014-05-18 LAB — URINE CULTURE

## 2014-05-18 LAB — GC/CHLAMYDIA PROBE AMP
CT PROBE, AMP APTIMA: NEGATIVE
GC Probe RNA: NEGATIVE

## 2014-05-26 NOTE — Telephone Encounter (Signed)
Patient has not contacted the office. Will re-file call.  

## 2014-06-20 ENCOUNTER — Ambulatory Visit: Payer: Medicaid Other | Admitting: Obstetrics & Gynecology

## 2014-07-20 ENCOUNTER — Ambulatory Visit (INDEPENDENT_AMBULATORY_CARE_PROVIDER_SITE_OTHER): Payer: Medicaid Other | Admitting: Certified Nurse Midwife

## 2014-07-20 ENCOUNTER — Encounter: Payer: Self-pay | Admitting: Certified Nurse Midwife

## 2014-07-20 ENCOUNTER — Other Ambulatory Visit: Payer: Self-pay | Admitting: *Deleted

## 2014-07-20 VITALS — BP 119/76 | HR 91 | Temp 97.4°F | Ht 63.0 in | Wt 215.0 lb

## 2014-07-20 DIAGNOSIS — F329 Major depressive disorder, single episode, unspecified: Secondary | ICD-10-CM

## 2014-07-20 DIAGNOSIS — Z0000 Encounter for general adult medical examination without abnormal findings: Secondary | ICD-10-CM | POA: Diagnosis not present

## 2014-07-20 DIAGNOSIS — N939 Abnormal uterine and vaginal bleeding, unspecified: Secondary | ICD-10-CM | POA: Diagnosis not present

## 2014-07-20 DIAGNOSIS — Z01419 Encounter for gynecological examination (general) (routine) without abnormal findings: Secondary | ICD-10-CM

## 2014-07-20 DIAGNOSIS — R079 Chest pain, unspecified: Secondary | ICD-10-CM

## 2014-07-20 DIAGNOSIS — F32A Depression, unspecified: Secondary | ICD-10-CM

## 2014-07-20 MED ORDER — IBUPROFEN 800 MG PO TABS: 800.0000 mg | ORAL_TABLET | Freq: Three times a day (TID) | ORAL | Status: DC | PRN

## 2014-07-20 MED ORDER — SERTRALINE HCL 50 MG PO TABS: 50.0000 mg | ORAL_TABLET | Freq: Every day | ORAL | Status: DC

## 2014-07-21 ENCOUNTER — Encounter: Payer: Self-pay | Admitting: Certified Nurse Midwife

## 2014-07-21 LAB — PAP IG W/ RFLX HPV ASCU

## 2014-07-21 NOTE — Progress Notes (Signed)
Patient ID: PALIN TRISTAN, female   DOB: 02-29-1991, 24 y.o.   MRN: [id number]   Subjective:     Annette Wood is a 24 y.o. female here for a routine exam.  Current complaints:  Depression, chest pain every morning when she wakes up, not associated with eating.  Under a lot of stress, having difficulty caring for young children in the home.    Denies any problems with menses.  Declines any contraception, states she uses condoms and is not regularly sexually active.   Personal health questionnaire:  Is patient Ashkenazi Jewish, have a family history of breast and/or ovarian cancer: no Is there a family history of uterine cancer diagnosed at age < 29, gastrointestinal cancer, urinary tract cancer, family member who is a Personnel officer syndrome-associated carrier: no Is the patient overweight and hypertensive, family history of diabetes, personal history of gestational diabetes, preeclampsia or PCOS: yes Is patient over 39, have PCOS,  family history of premature CHD under age 25, diabetes, smoke, have hypertension or peripheral artery disease:  no At any time, has a partner hit, kicked or otherwise hurt or frightened you?: no Over the past 2 weeks, have you felt down, depressed or hopeless?: yes Over the past 2 weeks, have you felt little interest or pleasure in doing things?:yes   Gynecologic History No LMP recorded. Contraception: none Last Pap: 06/04/2011. 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   Obstetric History OB History  Gravida Para Term Preterm AB SAB TAB Ectopic Multiple Living  # Outcome Date GA Lbr Len/2nd Weight Sex Delivery Anes PTL Lv  3 Term 04/01/13 [redacted]w[redacted]d 05:40 / 00:15 3.221 kg (7 lb 1.6 oz) F Vag-Spont EPI  Y  2 Term 09/20/09 [redacted]w[redacted]d  3.402 kg (7 lb 8 oz) F Vag-Spont EPI N Y  1 Term 06/09/08 [redacted]w[redacted]d  2.807 kg (6 lb 3 oz) M Vag-Spont None N Y     Comments: delivered in car      Past Medical History  Diagnosis  Date  . Gastroesophageal reflux   . Chlamydia   . Gonorrhea   . Panic attack   . Gallstones     Past Surgical History  Procedure Laterality Date  . Cholecystectomy N/A 11/24/2012    Procedure: LAPAROSCOPIC CHOLECYSTECTOMY WITH INTRAOPERATIVE CHOLANGIOGRAM;  Surgeon: Mariella Saa, MD;  Location: [facility];  Service: General;  Laterality: N/A;  . Cholecystectomy       Current outpatient prescriptions:  .  ibuprofen (ADVIL,MOTRIN) 800 MG tablet, Take 1 tablet (800 mg total) by mouth every 8 (eight) hours as needed for cramping., Disp: 30 tablet, Rfl: 2 .  sertraline (ZOLOFT) 50 MG tablet, Take 1 tablet (50 mg total) by mouth daily., Disp: 30 tablet, Rfl: 12 No Known Allergies  History  Substance Use Topics  . Smoking status: Former Games developer  . Smokeless tobacco: Never Used     Comment: 2014  . Alcohol Use: No    Family History  Problem Relation Age of Onset  . Diabetes Maternal Grandmother   . Lupus Maternal Grandmother       Review of Systems  Constitutional: negative for fatigue and weight loss Respiratory: negative for cough and wheezing Cardiovascular: positive for chest pain only in the morning on waking, fatigue and palpitations Gastrointestinal: negative for abdominal pain and change in bowel habits Musculoskeletal:negative for myalgias Neurological: negative for gait problems  and tremors Behavioral/Psych: negative for abusive relationship, positive for depression Endocrine: negative for temperature intolerance   Genitourinary:negative for abnormal menstrual periods, genital lesions, hot flashes, sexual problems and vaginal discharge Integument/breast: negative for breast lump, breast tenderness, nipple discharge and skin lesion(s)    Objective:       BP 119/76 mmHg  Pulse 91  Temp(Src) 97.4 F (36.3 C)  Ht 5\' 3"  (1.6 m)  Wt 97.523 kg (215 lb)  BMI 38.09 kg/m2 General:   alert  Skin:   no rash or abnormalities  Lungs:   clear to auscultation bilaterally   Heart:   regular rate and rhythm, S1, S2 normal, no murmur, click, rub or gallop  Breasts:   normal without suspicious masses, skin or nipple changes or axillary nodes  Abdomen:  normal findings: no organomegaly, soft, non-tender and no hernia  Pelvis:  External genitalia: normal general appearance Urinary system: urethral meatus normal and bladder without fullness, nontender Vaginal: normal without tenderness, induration or masses Cervix: normal appearance Adnexa: normal bimanual exam Uterus: anteverted and non-tender, normal size   Lab Review Urine pregnancy test Labs reviewed yes Radiologic studies reviewed none   Assessment:    Healthy female exam.   Chest pain Depression   Plan:    Education reviewed: depression evaluation, low fat, low cholesterol diet and safe sex/STD prevention. Contraception: condoms. Follow up in: 6 months.   Meds ordered this encounter  Medications  . DISCONTD: sertraline (ZOLOFT) 50 MG tablet    Sig: Take 1 tablet (50 mg total) by mouth daily.    Dispense:  30 tablet    Refill:  12  . DISCONTD: ibuprofen (ADVIL,MOTRIN) 800 MG tablet    Sig: Take 1 tablet (800 mg total) by mouth every 8 (eight) hours as needed for cramping.    Dispense:  30 tablet    Refill:  2   Orders Placed This Encounter  Procedures  . SureSwab, Vaginosis/Vaginitis Plus  . Ambulatory referral to Internal Medicine    Referral Priority:  Routine    Referral Type:  Consultation    Referral Reason:  Specialty Services Required    Requested Specialty:  Internal Medicine    Number of Visits Requested:  1  . EKG 12-Lead    Patient  complains of chest pain every am    Standing Status: Future     Number of Occurrences:      Standing Expiration Date: 07/20/2015    Order Specific Question:  Where should this test be performed    Answer:  [facility]   Encouraged counseling, and 12 lead EKG along with PCP involvement.

## 2014-07-22 ENCOUNTER — Telehealth: Payer: Self-pay

## 2014-07-22 NOTE — Telephone Encounter (Signed)
PATIENT IS SCHED[facility] WITH DR [facility]alli [facility]YUN SUN [facility] AND TO ESTABLISH PCP 07/27/14 AT 1:30PM

## 2014-07-23 LAB — SURESWAB, VAGINOSIS/VAGINITIS PLUS
Atopobium vaginae: DETECTED Log (cells/mL)
C. ALBICANS, DNA: DETECTED — AB
C. TROPICALIS, DNA: NOT DETECTED
C. glabrata, DNA: NOT DETECTED
C. parapsilosis, DNA: NOT DETECTED
C. trachomatis RNA, TMA: NOT DETECTED
LACTOBACILLUS SPECIES: NOT DETECTED Log (cells/mL)
MEGASPHAERA SPECIES: >8 Log (cells/mL)
N. GONORRHOEAE RNA, TMA: NOT DETECTED
T. vaginalis RNA, QL TMA: NOT DETECTED

## 2014-07-27 ENCOUNTER — Ambulatory Visit: Payer: Medicaid Other | Admitting: Certified Nurse Midwife

## 2014-07-29 ENCOUNTER — Other Ambulatory Visit: Payer: Self-pay | Admitting: *Deleted

## 2014-07-29 DIAGNOSIS — B379 Candidiasis, unspecified: Secondary | ICD-10-CM

## 2014-07-29 DIAGNOSIS — B9689 Other specified bacterial agents as the cause of diseases classified elsewhere: Secondary | ICD-10-CM

## 2014-07-29 DIAGNOSIS — N760 Acute vaginitis: Secondary | ICD-10-CM

## 2014-07-29 MED ORDER — FLUCONAZOLE 150 MG PO TABS: 150.0000 mg | ORAL_TABLET | Freq: Once | ORAL | Status: DC

## 2014-07-29 MED ORDER — METRONIDAZOLE 500 MG PO TABS: 500.0000 mg | ORAL_TABLET | Freq: Two times a day (BID) | ORAL | Status: DC

## 2014-08-08 ENCOUNTER — Telehealth: Payer: Self-pay

## 2014-08-08 NOTE — Telephone Encounter (Signed)
patient stated [facility] because she lost the phone number - gave her their number to reschedule appt

## 2014-09-19 ENCOUNTER — Telehealth: Payer: Self-pay | Admitting: *Deleted

## 2014-09-19 NOTE — Telephone Encounter (Signed)
Patient states she was recently in the office. Patient states she currently has the Nexplanon and has had her period for the last two weeks. Patient states she has been bleeding heavily and would like some to help her stop bleeding. GC/Chlmaydia testing in March 2016 was negative.

## 2014-09-21 NOTE — Telephone Encounter (Signed)
OCP treatment of 1 p o q 8 hours x 7 days.

## 2014-09-22 ENCOUNTER — Other Ambulatory Visit: Payer: Self-pay | Admitting: *Deleted

## 2014-09-22 NOTE — Telephone Encounter (Signed)
Patient advised of recommendations and verbalized understanding. Prescription sent to the pharmacy.

## 2014-10-04 ENCOUNTER — Encounter (HOSPITAL_COMMUNITY): Payer: Self-pay | Admitting: Emergency Medicine

## 2014-10-04 ENCOUNTER — Emergency Department (HOSPITAL_COMMUNITY)
Admission: EM | Admit: 2014-10-04 | Discharge: 2014-10-04 | Disposition: A | Discharge status: Home / Self Care | Payer: Medicaid Other | Attending: Emergency Medicine | Admitting: Emergency Medicine

## 2014-10-04 DIAGNOSIS — B379 Candidiasis, unspecified: Secondary | ICD-10-CM

## 2014-10-04 DIAGNOSIS — F410 Panic disorder [episodic paroxysmal anxiety] without agoraphobia: Secondary | ICD-10-CM | POA: Insufficient documentation

## 2014-10-04 DIAGNOSIS — Z8719 Personal history of other diseases of the digestive system: Secondary | ICD-10-CM | POA: Diagnosis not present

## 2014-10-04 DIAGNOSIS — Z87891 Personal history of nicotine dependence: Secondary | ICD-10-CM | POA: Diagnosis not present

## 2014-10-04 DIAGNOSIS — Z79899 Other long term (current) drug therapy: Secondary | ICD-10-CM | POA: Diagnosis not present

## 2014-10-04 DIAGNOSIS — B373 Candidiasis of vulva and vagina: Secondary | ICD-10-CM | POA: Diagnosis not present

## 2014-10-04 DIAGNOSIS — Z792 Long term (current) use of antibiotics: Secondary | ICD-10-CM | POA: Diagnosis not present

## 2014-10-04 DIAGNOSIS — N898 Other specified noninflammatory disorders of vagina: Secondary | ICD-10-CM

## 2014-10-04 DIAGNOSIS — Z3202 Encounter for pregnancy test, result negative: Secondary | ICD-10-CM | POA: Diagnosis not present

## 2014-10-04 LAB — URINALYSIS, ROUTINE W REFLEX MICROSCOPIC
Bilirubin Urine: NEGATIVE
GLUCOSE, UA: NEGATIVE mg/dL
Hgb urine dipstick: NEGATIVE
Ketones, ur: NEGATIVE mg/dL
Nitrite: NEGATIVE
Protein, ur: NEGATIVE mg/dL
SPECIFIC GRAVITY, URINE: 1.034 — AB (ref 1.005–1.030)
UROBILINOGEN UA: 1 mg/dL (ref 0.0–1.0)
pH: 6 (ref 5.0–8.0)

## 2014-10-04 LAB — WET PREP, GENITAL
CLUE CELLS WET PREP: NONE SEEN
TRICH WET PREP: NONE SEEN
Yeast Wet Prep HPF POC: NONE SEEN

## 2014-10-04 LAB — URINE MICROSCOPIC-ADD ON

## 2014-10-04 LAB — POC URINE PREG, ED: Preg Test, Ur: NEGATIVE

## 2014-10-04 MED ORDER — FLUCONAZOLE 150 MG PO TABS: 150.0000 mg | ORAL_TABLET | Freq: Once | ORAL | Status: DC

## 2014-10-04 NOTE — ED Provider Notes (Signed)
CSN: [id number]     Arrival date & time 10/04/14  1905 History   First MD Initiated Contact with Patient 10/04/14 2028     Chief Complaint  Patient presents with  . Vaginal Discharge     (Consider location/radiation/quality/duration/timing/severity/associated sxs/prior Treatment) Patient is a 24 y.o. female presenting with vaginal discharge. The history is provided by the patient and medical records.  Vaginal Discharge   This is a 24 y.o. F with hx of chlamydia and gonorrhea, anxiety, GERD, presenting to the ED for vaginal irritation/itching and vaginal discharge.  Patient states irritation began yesterday, has been progressively worsening, but noticed new vaginal discharge today.  States it is thick and white without odor.  She denies any urinary symptoms.  Does have hx of STD several years ago.  Does admit to new sexual partner in the past few weeks but is not overly concerned for STD at this time.  No abdominal pain, nausea, vomiting, or diarrhea.  She has tried changing her shower soap without relief.  VSS.  Past Medical History  Diagnosis Date  . Gastroesophageal reflux   . Chlamydia   . Gonorrhea   . Panic attack   . Gallstones    Past Surgical History  Procedure Laterality Date  . Cholecystectomy N/A 11/24/2012    Procedure: LAPAROSCOPIC CHOLECYSTECTOMY WITH INTRAOPERATIVE CHOLANGIOGRAM;  Surgeon: Mariella SaaBenjamin T Hoxworth, MD;  Location: [facility];  Service: General;  Laterality: N/A;  . Cholecystectomy     Family History  Problem Relation Age of Onset  . Diabetes Maternal Grandmother   . Lupus Maternal Grandmother    History  Substance Use Topics  . Smoking status: Former Games developermoker  . Smokeless tobacco: Never Used     Comment: 2014  . Alcohol Use: No   OB History    Gravida Para Term Preterm AB TAB SAB Ectopic Multiple Living   3 3 3       3      Review of Systems  Genitourinary: Positive for vaginal discharge.  All other systems reviewed and are negative.     Allergies   Review of patient's allergies indicates no known allergies.  Home Medications   Prior to Admission medications   Medication Sig Start Date End Date Taking? Authorizing Provider  fluconazole (DIFLUCAN) 150 MG tablet Take 1 tablet (150 mg total) by mouth once. 07/29/14   Brock Badharles A Harper, MD  ibuprofen (ADVIL,MOTRIN) 800 MG tablet Take 1 tablet (800 mg total) by mouth every 8 (eight) hours as needed for cramping. 07/20/14   Brock Badharles A Harper, MD  metroNIDAZOLE (FLAGYL) 500 MG tablet Take 1 tablet (500 mg total) by mouth 2 (two) times daily. 07/29/14   Brock Badharles A Harper, MD  sertraline (ZOLOFT) 50 MG tablet Take 1 tablet (50 mg total) by mouth daily. 07/20/14   Brock Badharles A Harper, MD   BP 120/83 mmHg  Pulse 87  Temp(Src) 98.4 F (36.9 C) (Oral)  Resp 20  SpO2 98%  LMP 09/18/2014 (Exact Date)   Physical Exam  Constitutional: She is oriented to person, place, and time. She appears well-developed and well-nourished.  HENT:  Head: Normocephalic and atraumatic.  Mouth/Throat: Oropharynx is clear and moist.  Eyes: Conjunctivae and EOM are normal. Pupils are equal, round, and reactive to light.  Neck: Normal range of motion.  Cardiovascular: Normal rate, regular rhythm and normal heart sounds.   Pulmonary/Chest: Effort normal and breath sounds normal.  Abdominal: Soft. Bowel sounds are normal.  Genitourinary: There is no lesion on the right  labia. There is no lesion on the left labia. Cervix exhibits no motion tenderness. Right adnexum displays no tenderness. Left adnexum displays no tenderness. No bleeding in the vagina. No foreign body around the vagina. Vaginal discharge found.  Normal female external genitalia without visible lesions; curd-like vaginal discharge surrounding introitus and in vaginal vault mixed with thin, what vaginal discharge; no adnexal or CMT  Musculoskeletal: Normal range of motion.  Neurological: She is alert and oriented to person, place, and time.  Skin: Skin is warm and  dry.  Psychiatric: She has a normal mood and affect.  Nursing note and vitals reviewed.   ED Course  Procedures (including critical care time) Labs Review Labs Reviewed  WET PREP, GENITAL - Abnormal; Notable for the following:    WBC, Wet Prep HPF POC MANY (*)    All other components within normal limits  URINALYSIS, ROUTINE W REFLEX MICROSCOPIC - Abnormal; Notable for the following:    Specific Gravity, Urine 1.034 (*)    Leukocytes, UA SMALL (*)    All other components within normal limits  URINE MICROSCOPIC-ADD ON  POC URINE PREG, ED  GC/CHLAMYDIA PROBE AMP ([facility])    Imaging Review No results found.   EKG Interpretation None      MDM   Final diagnoses:  Yeast infection  Vaginal discharge   24 year old female with vaginal irritation and vaginal discharge. She does admit to new sexual partner in the last few weeks but is not overly concerned for STD at this time.  Patient afebrile and nontoxic in appearance. Her abdominal exam is benign. There is large amount of thick, curd-like vaginal discharge on exam. No adnexal or cervical motion tenderness. Wet prep does not show yeast, however i feel she has a yeast infection.  No other abnormalities noted on wet prep.  Gc/chl pending.  Urine preg negative.  U/a non-infectious.  Patient started on course of diflucan.  Advised she will be notified if culture results are + or require treatment. Patient given OB-GYN follow-up.  Discussed plan with patient, he/she acknowledged understanding and agreed with plan of care.  Return precautions given for new or worsening symptoms.  Garlon HatchetLisa M Giani Betzold, PA-C 10/04/14 2313  Richardean Canalavid H Yao, MD 10/04/14 [phone]

## 2014-10-04 NOTE — Discharge Instructions (Signed)
Take the prescribed medication as directed. Follow-up with women's clinic for any ongoing symptoms. Return to the ED for new or worsening symptoms.

## 2014-10-04 NOTE — ED Notes (Signed)
Pt states yesterday she had some vaginal irritation and today she is having white discharge but does not have any odor

## 2014-10-05 LAB — GC/CHLAMYDIA PROBE AMP (~~LOC~~) NOT AT ARMC
Chlamydia: POSITIVE — AB
Neisseria Gonorrhea: NEGATIVE

## 2014-10-06 ENCOUNTER — Telehealth (HOSPITAL_BASED_OUTPATIENT_CLINIC_OR_DEPARTMENT_OTHER): Payer: Self-pay | Admitting: Emergency Medicine

## 2014-10-06 NOTE — Telephone Encounter (Signed)
Chart handoff to EDP for treatment of Chlamydia 

## 2014-10-07 ENCOUNTER — Telehealth: Payer: Self-pay

## 2014-10-07 ENCOUNTER — Telehealth (HOSPITAL_BASED_OUTPATIENT_CLINIC_OR_DEPARTMENT_OTHER): Payer: Self-pay | Admitting: Emergency Medicine

## 2014-10-07 NOTE — Telephone Encounter (Signed)
Pt aware of + chlamydia result, rx for azithromycin 1000mg  po called to [facility] [address]. Reeducated re abstinence and notification of sexual partner(s)

## 2014-10-07 NOTE — Telephone Encounter (Signed)
Also faxed patient medical records from [facility] - they were also sent electronically in EPIC in April 2016

## 2014-10-10 ENCOUNTER — Ambulatory Visit: Payer: Medicaid Other | Admitting: Obstetrics

## 2014-10-14 ENCOUNTER — Ambulatory Visit: Payer: Medicaid Other | Admitting: Obstetrics

## 2014-10-20 ENCOUNTER — Ambulatory Visit (INDEPENDENT_AMBULATORY_CARE_PROVIDER_SITE_OTHER): Payer: Medicaid Other | Admitting: Obstetrics

## 2014-10-20 ENCOUNTER — Encounter: Payer: Self-pay | Admitting: Obstetrics

## 2014-10-20 ENCOUNTER — Other Ambulatory Visit: Payer: Self-pay | Admitting: Obstetrics

## 2014-10-20 VITALS — BP 115/82 | HR 74 | Temp 98.3°F | Wt 218.0 lb

## 2014-10-20 DIAGNOSIS — R87613 High grade squamous intraepithelial lesion on cytologic smear of cervix (HGSIL): Secondary | ICD-10-CM

## 2014-10-20 DIAGNOSIS — N898 Other specified noninflammatory disorders of vagina: Secondary | ICD-10-CM

## 2014-10-20 NOTE — Patient Instructions (Signed)
Colposcopy Care After Colposcopy is a procedure in which a special tool is used to magnify the surface of the cervix. A tissue sample (biopsy) may also be taken. This sample will be looked at for cervical cancer or other problems. After the test:  You may have some cramping.  Lie down for a few minutes if you feel lightheaded.   You may have some bleeding which should stop in a few days. HOME CARE  Do not have sex or use tampons for 2 to 3 days or as told.  Only take medicine as told by your doctor.  Continue to take your birth control pills as usual. Finding out the results of your test Ask when your test results will be ready. Make sure you get your test results. GET HELP RIGHT AWAY IF:  You are bleeding a lot or are passing blood clots.  You develop a fever of 102 F (38.9 C) or higher.  You have abnormal vaginal discharge.  You have cramps that do not go away with medicine.  You feel lightheaded, dizzy, or pass out (faint). MAKE SURE YOU:   Understand these instructions.  Will watch your condition.  Will get help right away if you are not doing well or get worse. Document Released: 10/23/2007 Document Revised: 07/29/2011 Document Reviewed: 12/03/2012 ExitCare Patient Information 2015 [employer]. This information is not intended to replace advice given to you by your health care provider. Make sure you discuss any questions you have with your health care provider.  

## 2014-10-20 NOTE — Progress Notes (Signed)
Colposcopy Procedure Note  Indications: Pap smear 3 months ago showed: low-grade squamous intraepithelial neoplasia (LGSIL - encompassing HPV,mild dysplasia,CIN I). The prior pap showed high-grade squamous intraepithelial neoplasia  (HGSIL-encompassing moderate and severe dysplasia).  Prior cervical/vaginal disease: CIN 1. Prior cervical treatment: no treatment.  Procedure Details  The risks and benefits of the procedure and Written informed consent obtained.  A time-out was performed confirming the patient, procedure and allergy status  Speculum placed in vagina and excellent visualization of cervix achieved, cervix swabbed x 3 with acetic acid solution.  Findings: Cervix: no visible lesions; SCJ visualized 360 degrees without lesions, endocervical curettage performed, cervical biopsies taken at 6 and 12 o'clock, specimen labelled and sent to pathology and hemostasis achieved with silver nitrate.   Vaginal inspection: normal without visible lesions. Vulvar colposcopy: vulvar colposcopy not performed.   Physical Exam   Specimens: ECC and cervical biopsies  Complications: none.  Plan: Specimens labelled and sent to Pathology. Will base further treatment on Pathology findings. Post biopsy instructions given to patient. Return to discuss Pathology results in 2 weeks.

## 2014-10-21 ENCOUNTER — Encounter: Payer: Self-pay | Admitting: Obstetrics

## 2014-10-23 ENCOUNTER — Other Ambulatory Visit: Payer: Self-pay | Admitting: Obstetrics

## 2014-10-23 DIAGNOSIS — N760 Acute vaginitis: Principal | ICD-10-CM

## 2014-10-23 DIAGNOSIS — B9689 Other specified bacterial agents as the cause of diseases classified elsewhere: Secondary | ICD-10-CM

## 2014-10-23 DIAGNOSIS — B3731 Acute candidiasis of vulva and vagina: Secondary | ICD-10-CM

## 2014-10-23 DIAGNOSIS — B373 Candidiasis of vulva and vagina: Secondary | ICD-10-CM

## 2014-10-23 LAB — SURESWAB, VAGINOSIS/VAGINITIS PLUS
Atopobium vaginae: 7 Log (cells/mL)
BV CATEGORY: UNDETERMINED — AB
C. GLABRATA, DNA: NOT DETECTED
C. albicans, DNA: NOT DETECTED
C. parapsilosis, DNA: DETECTED — AB
C. trachomatis RNA, TMA: NOT DETECTED
C. tropicalis, DNA: NOT DETECTED
Gardnerella vaginalis: >8 Log (cells/mL)
LACTOBACILLUS SPECIES: 5.1 Log (cells/mL)
MEGASPHAERA SPECIES: NOT DETECTED Log (cells/mL)
N. GONORRHOEAE RNA, TMA: NOT DETECTED
T. vaginalis RNA, QL TMA: NOT DETECTED

## 2014-10-23 MED ORDER — FLUCONAZOLE 150 MG PO TABS: 150.0000 mg | ORAL_TABLET | Freq: Once | ORAL | Status: DC

## 2014-10-23 MED ORDER — TINIDAZOLE 500 MG PO TABS: 1000.0000 mg | ORAL_TABLET | Freq: Every day | ORAL | Status: DC

## 2014-10-28 ENCOUNTER — Encounter: Payer: Self-pay | Admitting: Obstetrics

## 2014-10-28 ENCOUNTER — Ambulatory Visit (INDEPENDENT_AMBULATORY_CARE_PROVIDER_SITE_OTHER): Payer: Medicaid Other | Admitting: Obstetrics

## 2014-10-28 VITALS — BP 128/83 | HR 86 | Temp 98.4°F | Wt 221.0 lb

## 2014-10-28 DIAGNOSIS — IMO0002 Reserved for concepts with insufficient information to code with codable children: Secondary | ICD-10-CM

## 2014-10-28 DIAGNOSIS — R896 Abnormal cytological findings in specimens from other organs, systems and tissues: Secondary | ICD-10-CM | POA: Diagnosis not present

## 2014-10-28 NOTE — Progress Notes (Signed)
Patient ID: Annette Wood, female   DOB: 1990/12/25, 24 y.o.   MRN: [id number]  Chief Complaint  Patient presents with  . Follow-up    results    HPI Annette Wood is a 24 y.o. female.  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  Patient presents for results and management recommendations.  HPI  Past Medical History  Diagnosis Date  . Gastroesophageal reflux   . Chlamydia   . Gonorrhea   . Panic attack   . Gallstones     Past Surgical History  Procedure Laterality Date  . Cholecystectomy N/A 11/24/2012    Procedure: LAPAROSCOPIC CHOLECYSTECTOMY WITH INTRAOPERATIVE CHOLANGIOGRAM;  Surgeon: Mariella Saa, MD;  Location: [facility];  Service: General;  Laterality: N/A;  . Cholecystectomy      Family History  Problem Relation Age of Onset  . Diabetes Maternal Grandmother   . Lupus Maternal Grandmother     Social History History  Substance Use Topics  . Smoking status: Former Games developer  . Smokeless tobacco: Never Used     Comment: 2014  . Alcohol Use: No    No Known Allergies  Current Outpatient Prescriptions  Medication Sig Dispense Refill  . fluconazole (DIFLUCAN) 150 MG tablet Take 1 tablet (150 mg total) by mouth once. Repeat in 72 hours if needed. (Patient not taking: Reported on 10/20/2014) 2 tablet 0  . fluconazole (DIFLUCAN) 150 MG tablet Take 1 tablet (150 mg total) by mouth once. 1 tablet 2  . ibuprofen (ADVIL,MOTRIN) 800 MG tablet Take 1 tablet (800 mg total) by mouth every 8 (eight) hours as needed for cramping. (Patient not taking: Reported on 10/20/2014) 30 tablet 2  . metroNIDAZOLE (FLAGYL) 500 MG tablet Take 1 tablet (500 mg total) by mouth 2 (two) times daily. (Patient not taking: Reported on 10/04/2014) 14 tablet 2  . sertraline (ZOLOFT) 50 MG tablet Take 1 tablet (50 mg total) by mouth daily. 30 tablet 12  . tinidazole (TINDAMAX) 500 MG tablet Take 2 tablets (1,000 mg total) by mouth daily with breakfast. 10 tablet 2   No current facility-administered  medications for this visit.    Review of Systems Review of Systems Constitutional: negative for fatigue and weight loss Respiratory: negative for cough and wheezing Cardiovascular: negative for chest pain, fatigue and palpitations Gastrointestinal: negative for abdominal pain and change in bowel habits Genitourinary:negative Integument/breast: negative for nipple discharge Musculoskeletal:negative for myalgias Neurological: negative for gait problems and tremors Behavioral/Psych: negative for abusive relationship, depression Endocrine: negative for temperature intolerance     Blood pressure 128/83, pulse 86, temperature 98.4 F (36.9 C), weight 221 lb (100.245 kg), last menstrual period 10/24/2014.  Physical Exam Physical Exam:  Deferred  100% of 10 min visit spent on counseling and coordination of care.   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   No orders of the defined types were placed in this encounter.

## 2014-11-03 ENCOUNTER — Telehealth: Payer: Self-pay | Admitting: *Deleted

## 2014-11-03 NOTE — Telephone Encounter (Signed)
Per Dr. Clearance Coots patient needs to be scheduled for Cryocautery. Patient has been scheduled for 11-04-14.

## 2014-11-04 ENCOUNTER — Ambulatory Visit (INDEPENDENT_AMBULATORY_CARE_PROVIDER_SITE_OTHER): Payer: Medicaid Other | Admitting: Obstetrics

## 2014-11-04 ENCOUNTER — Encounter: Payer: Self-pay | Admitting: Obstetrics

## 2014-11-04 VITALS — BP 116/82 | HR 77 | Temp 98.5°F | Ht 63.0 in | Wt 218.0 lb

## 2014-11-04 DIAGNOSIS — Z01812 Encounter for preprocedural laboratory examination: Secondary | ICD-10-CM

## 2014-11-04 DIAGNOSIS — IMO0002 Reserved for concepts with insufficient information to code with codable children: Secondary | ICD-10-CM

## 2014-11-04 DIAGNOSIS — B9689 Other specified bacterial agents as the cause of diseases classified elsewhere: Secondary | ICD-10-CM

## 2014-11-04 DIAGNOSIS — N760 Acute vaginitis: Secondary | ICD-10-CM

## 2014-11-04 DIAGNOSIS — R896 Abnormal cytological findings in specimens from other organs, systems and tissues: Secondary | ICD-10-CM | POA: Diagnosis not present

## 2014-11-04 LAB — POCT URINE PREGNANCY: Preg Test, Ur: NEGATIVE

## 2014-11-04 MED ORDER — CLINDAMYCIN HCL 300 MG PO CAPS: 300.0000 mg | ORAL_CAPSULE | Freq: Three times a day (TID) | ORAL | Status: DC

## 2014-11-04 NOTE — Progress Notes (Signed)
RATIONALE:      The reason for the procedure is CIN II   PROCEDURE: After placement of the Grave's speculum, the appropriate cryoprobe was selected.  KY jelly was applied to the cryoprobe.  The probe was then applied to the cervix until an ice ball had formed.  This ice ball was then allowed to remain in place a three min..  The probe was then removed and the cervix allowed to thaw for 3 minutes.  The probe was then reapplied and an ice ball formed and allowed to remain in place for a final 3 minutes.  INSTRUCTIONS: The cryoprobe was then removed.  The patient was given a preprinted instruction list and is instructed to return to the office in 2 weeks for a follow up examination.   Akiya Morr A. Clearance Coots MD 11-04-2014

## 2014-11-04 NOTE — Addendum Note (Signed)
Addended by: Marya Landry D on: 11/04/2014 01:29 PM   Modules accepted: Orders

## 2014-11-08 LAB — SURESWAB, VAGINOSIS/VAGINITIS PLUS
ATOPOBIUM VAGINAE: 6.4 Log (cells/mL)
BV CATEGORY: UNDETERMINED — AB
C. albicans, DNA: NOT DETECTED
C. glabrata, DNA: NOT DETECTED
C. parapsilosis, DNA: NOT DETECTED
C. trachomatis RNA, TMA: NOT DETECTED
C. tropicalis, DNA: NOT DETECTED
LACTOBACILLUS SPECIES: 6.5 Log (cells/mL)
MEGASPHAERA SPECIES: >8 Log (cells/mL)
N. gonorrhoeae RNA, TMA: NOT DETECTED
T. vaginalis RNA, QL TMA: NOT DETECTED

## 2014-11-10 ENCOUNTER — Other Ambulatory Visit: Payer: Self-pay | Admitting: Obstetrics

## 2014-11-17 ENCOUNTER — Telehealth: Payer: Self-pay | Admitting: *Deleted

## 2014-11-17 NOTE — Telephone Encounter (Signed)
11:40 patient called back- LM 5:47 LM on VM- Dr Verdell CarmineHarper's information and recommendation- patient encouraged to keep appointment 7/5

## 2014-11-17 NOTE — Telephone Encounter (Signed)
Patient states she had a procedure and she is still having a discharge with odor. She is down to her last day of antibiotic. 11:36 LM on VM to CB ( Cryo 6/17- expect discharge up to 4 weeks- may use vinegar water douche)

## 2014-11-22 ENCOUNTER — Ambulatory Visit: Payer: Self-pay | Admitting: Obstetrics

## 2014-11-23 ENCOUNTER — Encounter: Payer: Self-pay | Admitting: Obstetrics

## 2014-11-23 ENCOUNTER — Ambulatory Visit (INDEPENDENT_AMBULATORY_CARE_PROVIDER_SITE_OTHER): Payer: Medicaid Other | Admitting: Obstetrics

## 2014-11-23 VITALS — BP 125/86 | HR 81 | Temp 97.9°F | Wt 220.0 lb

## 2014-11-23 DIAGNOSIS — R896 Abnormal cytological findings in specimens from other organs, systems and tissues: Secondary | ICD-10-CM

## 2014-11-23 DIAGNOSIS — IMO0002 Reserved for concepts with insufficient information to code with codable children: Secondary | ICD-10-CM

## 2014-11-23 NOTE — Progress Notes (Signed)
Patient ID: Annette Wood, female   DOB: 01/11/1991, 24 y.o.   MRN: [id number]  Chief Complaint  Patient presents with  . Follow-up    cryo    HPI Annette Wood is a 24 y.o. female.  Two week F/U after Cryocautery of Cervix.  No c/o.  HPI  Past Medical History  Diagnosis Date  . Gastroesophageal reflux   . Chlamydia   . Gonorrhea   . Panic attack   . Gallstones     Past Surgical History  Procedure Laterality Date  . Cholecystectomy N/A 11/24/2012    Procedure: LAPAROSCOPIC CHOLECYSTECTOMY WITH INTRAOPERATIVE CHOLANGIOGRAM;  Surgeon: Mariella Saa, MD;  Location: [facility];  Service: General;  Laterality: N/A;  . Cholecystectomy      Family History  Problem Relation Age of Onset  . Diabetes Maternal Grandmother   . Lupus Maternal Grandmother     Social History History  Substance Use Topics  . Smoking status: Never Smoker   . Smokeless tobacco: Never Used  . Alcohol Use: No    No Known Allergies  Current Outpatient Prescriptions  Medication Sig Dispense Refill  . clindamycin (CLEOCIN) 300 MG capsule Take 1 capsule (300 mg total) by mouth 3 (three) times daily. (Patient not taking: Reported on 11/23/2014) 30 capsule 1  . fluconazole (DIFLUCAN) 150 MG tablet Take 1 tablet (150 mg total) by mouth once. Repeat in 72 hours if needed. (Patient not taking: Reported on 10/20/2014) 2 tablet 0  . fluconazole (DIFLUCAN) 150 MG tablet Take 1 tablet (150 mg total) by mouth once. (Patient not taking: Reported on 11/04/2014) 1 tablet 2  . ibuprofen (ADVIL,MOTRIN) 800 MG tablet Take 1 tablet (800 mg total) by mouth every 8 (eight) hours as needed for cramping. (Patient not taking: Reported on 10/20/2014) 30 tablet 2  . metroNIDAZOLE (FLAGYL) 500 MG tablet Take 1 tablet (500 mg total) by mouth 2 (two) times daily. (Patient not taking: Reported on 10/04/2014) 14 tablet 2  . sertraline (ZOLOFT) 50 MG tablet Take 1 tablet (50 mg total) by mouth daily. (Patient not taking: Reported on  11/04/2014) 30 tablet 12  . tinidazole (TINDAMAX) 500 MG tablet Take 2 tablets (1,000 mg total) by mouth daily with breakfast. (Patient not taking: Reported on 11/04/2014) 10 tablet 2   No current facility-administered medications for this visit.    Review of Systems Review of Systems Constitutional: negative for fatigue and weight loss Respiratory: negative for cough and wheezing Cardiovascular: negative for chest pain, fatigue and palpitations Gastrointestinal: negative for abdominal pain and change in bowel habits Genitourinary:negative Integument/breast: negative for nipple discharge Musculoskeletal:negative for myalgias Neurological: negative for gait problems and tremors Behavioral/Psych: negative for abusive relationship, depression Endocrine: negative for temperature intolerance     Blood pressure 125/86, pulse 81, temperature 97.9 F (36.6 C), weight 220 lb (99.791 kg), last menstrual period 11/21/2014.  Physical Exam Physical Exam:             General:  Alert.  No distress. Abdomen:  normal findings: no organomegaly, soft, non-tender and no hernia  Pelvis:  External genitalia: normal general appearance Urinary system: urethral meatus normal and bladder without fullness, nontender Vaginal: normal without tenderness, induration or masses Cervix: normal appearance Adnexa: normal bimanual exam Uterus: anteverted and non-tender, normal size      Data Reviewed Labs  Assessment     HGSIL.  S/P Cryocautery of Cervix.  Doing well    Plan    F/U 4 months for pap smear per  Dysplasia Protocol.   No orders of the defined types were placed in this encounter.   No orders of the defined types were placed in this encounter.

## 2014-12-15 ENCOUNTER — Ambulatory Visit (INDEPENDENT_AMBULATORY_CARE_PROVIDER_SITE_OTHER): Payer: Medicaid Other | Admitting: Obstetrics

## 2014-12-15 ENCOUNTER — Encounter: Payer: Self-pay | Admitting: Obstetrics

## 2014-12-15 VITALS — BP 119/86 | HR 79 | Temp 98.3°F | Ht 63.0 in | Wt 222.0 lb

## 2014-12-15 DIAGNOSIS — N760 Acute vaginitis: Secondary | ICD-10-CM | POA: Diagnosis not present

## 2014-12-15 DIAGNOSIS — N939 Abnormal uterine and vaginal bleeding, unspecified: Secondary | ICD-10-CM

## 2014-12-15 DIAGNOSIS — Z3040 Encounter for surveillance of contraceptives, unspecified: Secondary | ICD-10-CM

## 2014-12-15 DIAGNOSIS — N946 Dysmenorrhea, unspecified: Secondary | ICD-10-CM

## 2014-12-15 MED ORDER — IBUPROFEN 800 MG PO TABS: 800.0000 mg | ORAL_TABLET | Freq: Three times a day (TID) | ORAL | Status: DC | PRN

## 2014-12-15 NOTE — Progress Notes (Signed)
Patient ID: Annette Wood, female   DOB: [birth year], 24 y.o.   MRN: [id number]  Chief Complaint  Patient presents with  . AUB    HPI Annette Wood is a 24 y.o. female.  Period has been on for 2-3 weeks.  Having some pain with intercourse.  Occasional cramping. HPI  Past Medical History  Diagnosis Date  . Gastroesophageal reflux   . Chlamydia   . Gonorrhea   . Panic attack   . Gallstones     Past Surgical History  Procedure Laterality Date  . Cholecystectomy N/A 11/24/2012    Procedure: LAPAROSCOPIC CHOLECYSTECTOMY WITH INTRAOPERATIVE CHOLANGIOGRAM;  Surgeon: Mariella Saa, MD;  Location: [facility];  Service: General;  Laterality: N/A;  . Cholecystectomy      Family History  Problem Relation Age of Onset  . Diabetes Maternal Grandmother   . Lupus Maternal Grandmother     Social History History  Substance Use Topics  . Smoking status: Never Smoker   . Smokeless tobacco: Never Used  . Alcohol Use: No    No Known Allergies  Current Outpatient Prescriptions  Medication Sig Dispense Refill  . sertraline (ZOLOFT) 50 MG tablet Take 1 tablet (50 mg total) by mouth daily. 30 tablet 12   No current facility-administered medications for this visit.    Review of Systems Review of Systems Constitutional: negative for fatigue and weight loss Respiratory: negative for cough and wheezing Cardiovascular: negative for chest pain, fatigue and palpitations Gastrointestinal: negative for abdominal pain and change in bowel habits Genitourinary: Bleeding after intercourse.  Cramping. Integument/breast: negative for nipple discharge Musculoskeletal:negative for myalgias Neurological: negative for gait problems and tremors Behavioral/Psych: negative for abusive relationship, depression Endocrine: negative for temperature intolerance     Blood pressure 119/86, pulse 79, temperature 98.3 F (36.8 C), height  (1.6 m), weight 222 lb (100.699 kg), last menstrual period  11/21/2014.  Physical Exam Physical Exam General:   alert  Skin:   no rash or abnormalities  Lungs:   clear to auscultation bilaterally  Heart:   regular rate and rhythm, S1, S2 normal, no murmur, click, rub or gallop  Breasts:   normal without suspicious masses, skin or nipple changes or axillary nodes  Abdomen:  normal findings: no organomegaly, soft, non-tender and no hernia  Pelvis:  External genitalia: normal general appearance Urinary system: urethral meatus normal and bladder without fullness, nontender Vaginal: normal without tenderness, induration or masses Cervix: normal appearance Adnexa: normal bimanual exam Uterus: anteverted and non-tender, normal size      Data Reviewed Labs  Assessment     AUB Dysmenorrhea  Contraceptive management    Plan    Wet prep and cultures sent Will treat with test results Continue Implanon  No orders of the defined types were placed in this encounter.   No orders of the defined types were placed in this encounter.

## 2014-12-19 ENCOUNTER — Other Ambulatory Visit: Payer: Self-pay | Admitting: Obstetrics

## 2014-12-19 ENCOUNTER — Telehealth: Payer: Self-pay | Admitting: *Deleted

## 2014-12-19 DIAGNOSIS — N760 Acute vaginitis: Principal | ICD-10-CM

## 2014-12-19 DIAGNOSIS — B9689 Other specified bacterial agents as the cause of diseases classified elsewhere: Secondary | ICD-10-CM

## 2014-12-19 LAB — SURESWAB, VAGINOSIS/VAGINITIS PLUS
ATOPOBIUM VAGINAE: 7.7 Log (cells/mL)
BV CATEGORY: UNDETERMINED — AB
C. GLABRATA, DNA: NOT DETECTED
C. PARAPSILOSIS, DNA: NOT DETECTED
C. albicans, DNA: NOT DETECTED
C. trachomatis RNA, TMA: NOT DETECTED
C. tropicalis, DNA: NOT DETECTED
Gardnerella vaginalis: >8 Log (cells/mL)
LACTOBACILLUS SPECIES: DETECTED Log (cells/mL)
N. gonorrhoeae RNA, TMA: NOT DETECTED
T. vaginalis RNA, QL TMA: NOT DETECTED

## 2014-12-19 MED ORDER — METRONIDAZOLE 500 MG PO TABS: 500.0000 mg | ORAL_TABLET | Freq: Two times a day (BID) | ORAL | Status: DC

## 2014-12-19 NOTE — Telephone Encounter (Signed)
Patient is calling for labs results- please review for patient notification.

## 2014-12-19 NOTE — Telephone Encounter (Signed)
Patient advised of test results and that they had been sent to the doctor for recommendations as to what medication he wants to prescribe. Patient advised to check with her pharmacy. Patient verbalized understanding.

## 2014-12-19 NOTE — Telephone Encounter (Signed)
She has BV.  Flagyl Rx.

## 2014-12-22 NOTE — Telephone Encounter (Signed)
Patient notified

## 2015-02-01 ENCOUNTER — Ambulatory Visit: Payer: Medicaid Other | Admitting: Obstetrics

## 2015-03-27 ENCOUNTER — Encounter: Payer: Self-pay | Admitting: Obstetrics

## 2015-03-27 ENCOUNTER — Ambulatory Visit (INDEPENDENT_AMBULATORY_CARE_PROVIDER_SITE_OTHER): Payer: Medicaid Other | Admitting: Obstetrics

## 2015-03-27 VITALS — BP 119/77 | HR 91 | Temp 98.2°F | Ht 63.0 in | Wt 230.0 lb

## 2015-03-27 DIAGNOSIS — IMO0002 Reserved for concepts with insufficient information to code with codable children: Secondary | ICD-10-CM

## 2015-03-27 DIAGNOSIS — R896 Abnormal cytological findings in specimens from other organs, systems and tissues: Secondary | ICD-10-CM

## 2015-03-28 ENCOUNTER — Encounter: Payer: Self-pay | Admitting: Obstetrics

## 2015-03-28 NOTE — Progress Notes (Signed)
Patient ID: Annette Wood, female   DOB: [birth year], 24 y.o.   MRN: [id number]  Chief Complaint  Patient presents with  . Follow-up    Repeat Pap      HPI Annette Karvonenichina M Lowdermilk is a 24 y.o. female.  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  Presents for pap smear per Dysplasia Protocol. HPI  Past Medical History  Diagnosis Date  . Gastroesophageal reflux   . Chlamydia   . Gonorrhea   . Panic attack   . Gallstones     Past Surgical History  Procedure Laterality Date  . Cholecystectomy N/A 11/24/2012    Procedure: LAPAROSCOPIC CHOLECYSTECTOMY WITH INTRAOPERATIVE CHOLANGIOGRAM;  Surgeon: Mariella SaaBenjamin T Hoxworth, MD;  Location: [facility];  Service: General;  Laterality: N/A;  . Cholecystectomy      Family History  Problem Relation Age of Onset  . Diabetes Maternal Grandmother   . Lupus Maternal Grandmother     Social History Social History  Substance Use Topics  . Smoking status: Never Smoker   . Smokeless tobacco: Never Used  . Alcohol Use: No    No Known Allergies  No current outpatient prescriptions on file.   No current facility-administered medications for this visit.    Review of Systems Review of Systems Constitutional: negative for fatigue and weight loss Respiratory: negative for cough and wheezing Cardiovascular: negative for chest pain, fatigue and palpitations Gastrointestinal: negative for abdominal pain and change in bowel habits Genitourinary:negative Integument/breast: negative for nipple discharge Musculoskeletal:negative for myalgias Neurological: negative for gait problems and tremors Behavioral/Psych: negative for abusive relationship, depression Endocrine: negative for temperature intolerance     Blood pressure 119/77, pulse 91, temperature 98.2 F (36.8 C), height 5\' 3"  (1.6 m), weight 230 lb (104.327 kg), last menstrual period 02/20/2015.  Physical Exam Physical Exam General:   alert  Skin:   no rash or abnormalities  Lungs:   clear to  auscultation bilaterally  Heart:   regular rate and rhythm, S1, S2 normal, no murmur, click, rub or gallop  Breasts:   normal without suspicious masses, skin or nipple changes or axillary nodes  Abdomen:  normal findings: no organomegaly, soft, non-tender and no hernia  Pelvis:  External genitalia: normal general appearance Urinary system: urethral meatus normal and bladder without fullness, nontender Vaginal: normal without tenderness, induration or masses Cervix: normal appearance Adnexa: normal bimanual exam Uterus: anteverted and non-tender, normal size      Data Reviewed Previous pap smear and colposcopy.  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  S/P Cryocautery of cervix.     Plan    Pap smear done. F/U 4 months for repeat pap.   Orders Placed This Encounter  Procedures  . SureSwab, Vaginosis/Vaginitis Plus   No orders of the defined types were placed in this encounter.

## 2015-03-29 LAB — PAP IG (IMAGE GUIDED)

## 2015-03-30 LAB — SURESWAB, VAGINOSIS/VAGINITIS PLUS
Atopobium vaginae: 6.4 Log (cells/mL)
C. GLABRATA, DNA: NOT DETECTED
C. TROPICALIS, DNA: NOT DETECTED
C. albicans, DNA: NOT DETECTED
C. parapsilosis, DNA: DETECTED — AB
C. trachomatis RNA, TMA: NOT DETECTED
LACTOBACILLUS SPECIES: NOT DETECTED Log (cells/mL)
MEGASPHAERA SPECIES: 7.5 Log (cells/mL)
N. gonorrhoeae RNA, TMA: NOT DETECTED
T. VAGINALIS RNA, QL TMA: NOT DETECTED

## 2015-03-31 ENCOUNTER — Other Ambulatory Visit: Payer: Self-pay | Admitting: Obstetrics

## 2015-03-31 DIAGNOSIS — B373 Candidiasis of vulva and vagina: Secondary | ICD-10-CM

## 2015-03-31 DIAGNOSIS — N760 Acute vaginitis: Principal | ICD-10-CM

## 2015-03-31 DIAGNOSIS — B3731 Acute candidiasis of vulva and vagina: Secondary | ICD-10-CM

## 2015-03-31 DIAGNOSIS — B9689 Other specified bacterial agents as the cause of diseases classified elsewhere: Secondary | ICD-10-CM

## 2015-03-31 MED ORDER — METRONIDAZOLE 500 MG PO TABS: 500.0000 mg | ORAL_TABLET | Freq: Two times a day (BID) | ORAL | Status: DC

## 2015-03-31 MED ORDER — FLUCONAZOLE 150 MG PO TABS: 150.0000 mg | ORAL_TABLET | Freq: Once | ORAL | Status: DC

## 2015-04-27 ENCOUNTER — Encounter (HOSPITAL_COMMUNITY): Payer: Self-pay | Admitting: Emergency Medicine

## 2015-04-27 ENCOUNTER — Emergency Department (HOSPITAL_COMMUNITY)
Admission: EM | Admit: 2015-04-27 | Discharge: 2015-04-27 | Discharge status: Against Medical Advice | Payer: Medicaid Other | Attending: Emergency Medicine | Admitting: Emergency Medicine

## 2015-04-27 DIAGNOSIS — N898 Other specified noninflammatory disorders of vagina: Secondary | ICD-10-CM | POA: Diagnosis present

## 2015-04-27 DIAGNOSIS — N760 Acute vaginitis: Secondary | ICD-10-CM | POA: Diagnosis not present

## 2015-04-27 DIAGNOSIS — Z8659 Personal history of other mental and behavioral disorders: Secondary | ICD-10-CM | POA: Insufficient documentation

## 2015-04-27 DIAGNOSIS — Z79899 Other long term (current) drug therapy: Secondary | ICD-10-CM | POA: Insufficient documentation

## 2015-04-27 DIAGNOSIS — Z792 Long term (current) use of antibiotics: Secondary | ICD-10-CM | POA: Insufficient documentation

## 2015-04-27 DIAGNOSIS — Z8719 Personal history of other diseases of the digestive system: Secondary | ICD-10-CM | POA: Diagnosis not present

## 2015-04-27 DIAGNOSIS — B9689 Other specified bacterial agents as the cause of diseases classified elsewhere: Secondary | ICD-10-CM

## 2015-04-27 LAB — COMPREHENSIVE METABOLIC PANEL
ALK PHOS: 66 U/L (ref 38–126)
ALT: 15 U/L (ref 14–54)
AST: 17 U/L (ref 15–41)
Albumin: 3.7 g/dL (ref 3.5–5.0)
Anion gap: 9 (ref 5–15)
BILIRUBIN TOTAL: 0.4 mg/dL (ref 0.3–1.2)
BUN: 10 mg/dL (ref 6–20)
CALCIUM: 9 mg/dL (ref 8.9–10.3)
CO2: 24 mmol/L (ref 22–32)
Chloride: 105 mmol/L (ref 101–111)
Creatinine, Ser: 0.78 mg/dL (ref 0.44–1.00)
GFR calc Af Amer: >60 mL/min (ref >60)
GFR calc non Af Amer: >60 mL/min (ref >60)
Glucose, Bld: 90 mg/dL (ref 65–99)
Potassium: 3.8 mmol/L (ref 3.5–5.1)
SODIUM: 138 mmol/L (ref 135–145)
TOTAL PROTEIN: 7.5 g/dL (ref 6.5–8.1)

## 2015-04-27 LAB — URINE MICROSCOPIC-ADD ON

## 2015-04-27 LAB — CBC
HEMATOCRIT: 35.8 % — AB (ref 36.0–46.0)
HEMOGLOBIN: 11.8 g/dL — AB (ref 12.0–15.0)
MCH: 28.6 pg (ref 26.0–34.0)
MCHC: 33 g/dL (ref 30.0–36.0)
MCV: 86.9 fL (ref 78.0–100.0)
Platelets: 270 10*3/uL (ref 150–400)
RBC: 4.12 MIL/uL (ref 3.87–5.11)
RDW: 13.4 % (ref 11.5–15.5)
WBC: 7.3 10*3/uL (ref 4.0–10.5)

## 2015-04-27 LAB — I-STAT BETA HCG BLOOD, ED (MC, WL, AP ONLY): I-stat hCG, quantitative: <5 m[IU]/mL (ref <5)

## 2015-04-27 LAB — WET PREP, GENITAL
Sperm: NONE SEEN
TRICH WET PREP: NONE SEEN
YEAST WET PREP: NONE SEEN

## 2015-04-27 LAB — URINALYSIS, ROUTINE W REFLEX MICROSCOPIC
Glucose, UA: NEGATIVE mg/dL
HGB URINE DIPSTICK: NEGATIVE
KETONES UR: 15 mg/dL — AB
NITRITE: NEGATIVE
PH: 5.5 (ref 5.0–8.0)
Protein, ur: NEGATIVE mg/dL
SPECIFIC GRAVITY, URINE: 1.031 — AB (ref 1.005–1.030)

## 2015-04-27 LAB — LIPASE, BLOOD: Lipase: 62 U/L — ABNORMAL HIGH (ref 11–51)

## 2015-04-27 MED ORDER — METRONIDAZOLE 500 MG PO TABS: 500.0000 mg | ORAL_TABLET | Freq: Once | ORAL | Status: DC

## 2015-04-27 NOTE — ED Notes (Addendum)
Pt not in room.EDP aware.

## 2015-04-27 NOTE — ED Notes (Signed)
Pt from home with c/p bilateral lower abdominal pain with vaginal discharge x 2 days.  Pt denies bleeding.  NAD, A&O.

## 2015-04-27 NOTE — ED Provider Notes (Signed)
History  By signing my name below, I, Karle PlumberJennifer Tensley, attest that this documentation has been prepared under the direction and in the presence of Federated Department StoresHanna Patel-Mills, PA-C. Electronically Signed: Karle PlumberJennifer Tensley, ED Scribe. 04/27/2015. 7:42 PM.  Chief Complaint  Patient presents with  . Abdominal Pain  . Vaginal Discharge   The history is provided by the patient and medical records. No language interpreter was used.    HPI Comments:  Annette Wood is a 24 y.o. obese female who presents to the Emergency Department complaining of moderate lower abdominal pain that began two days ago. She reports associated whitish vaginal discharge that began earlier today. Pt reports being diagnosed with a yeast infection about two weeks ago with no relief so repeated the dose about four days ago. She has not taken anything for pain. She denies modifying factors. She denies dysuria, vaginal bleeding, hematuria, frequent urination, fever, chills, nausea or vomiting. Her LMP was 03/30/15 and is has Implanon as birth control. She denies alcohol use.   Past Medical History  Diagnosis Date  . Gastroesophageal reflux   . Chlamydia   . Gonorrhea   . Panic attack   . Gallstones    Past Surgical History  Procedure Laterality Date  . Cholecystectomy N/A 11/24/2012    Procedure: LAPAROSCOPIC CHOLECYSTECTOMY WITH INTRAOPERATIVE CHOLANGIOGRAM;  Surgeon: Mariella SaaBenjamin T Hoxworth, MD;  Location: [facility];  Service: General;  Laterality: N/A;  . Cholecystectomy     Family History  Problem Relation Age of Onset  . Diabetes Maternal Grandmother   . Lupus Maternal Grandmother    Social History  Substance Use Topics  . Smoking status: Never Smoker   . Smokeless tobacco: Never Used  . Alcohol Use: No   OB History    Gravida Para Term Preterm AB TAB SAB Ectopic Multiple Living   3 3 3       3      Review of Systems  Constitutional: Negative for fever and chills.  Gastrointestinal: Positive for abdominal pain.  Negative for nausea and vomiting.  Genitourinary: Positive for vaginal discharge. Negative for dysuria, frequency and hematuria.  All other systems reviewed and are negative.   Allergies  Review of patient's allergies indicates no known allergies.  Home Medications   Prior to Admission medications   Medication Sig Start Date End Date Taking? Authorizing Provider  fluconazole (DIFLUCAN) 150 MG tablet Take 1 tablet (150 mg total) by mouth once. 03/31/15   Brock Badharles A Harper, MD  metroNIDAZOLE (FLAGYL) 500 MG tablet Take 1 tablet (500 mg total) by mouth 2 (two) times daily. 03/31/15   Brock Badharles A Harper, MD   Triage Vitals: BP 126/89 mmHg  Pulse 82  Temp(Src) 98.6 F (37 C) (Oral)  Resp 17  Ht 5\' 3"  (1.6 m)  Wt 235 lb (106.595 kg)  BMI 41.64 kg/m2  SpO2 99%  LMP 03/30/2015 (Exact Date) Physical Exam  Constitutional: She is oriented to person, place, and time. She appears well-developed and well-nourished.  HENT:  Head: Normocephalic and atraumatic.  Eyes: EOM are normal.  Neck: Normal range of motion.  Cardiovascular: Normal rate.   Pulmonary/Chest: Effort normal.  Abdominal: Soft. There is no tenderness. There is no rebound and no guarding.  Genitourinary:  Exam chaperoned by female nurse. Malodorous thick, white discharge throughout vaginal vault. No vaginal bleeding. Cervical os is closed. No adnexal tenderness. No abdominal tenderness. No guarding or rebound.  Musculoskeletal: Normal range of motion.  Neurological: She is alert and oriented to person, place, and  time.  Skin: Skin is warm and dry.  Psychiatric: She has a normal mood and affect. Her behavior is normal.  Nursing note and vitals reviewed.   ED Course  Procedures (including critical care time) DIAGNOSTIC STUDIES: Oxygen Saturation is 99% on RA, normal by my interpretation.   COORDINATION OF CARE: 6:51 PM- Will perform pelvic exam. Pt verbalizes understanding and agrees to plan.  Medications  metroNIDAZOLE  (FLAGYL) tablet 500 mg (not administered)    Labs Review Labs Reviewed  WET PREP, GENITAL - Abnormal; Notable for the following:    Clue Cells Wet Prep HPF POC FEW (*)    WBC, Wet Prep HPF POC MANY (*)    All other components within normal limits  LIPASE, BLOOD - Abnormal; Notable for the following:    Lipase 62 (*)    All other components within normal limits  CBC - Abnormal; Notable for the following:    Hemoglobin 11.8 (*)    HCT 35.8 (*)    All other components within normal limits  URINALYSIS, ROUTINE W REFLEX MICROSCOPIC (NOT AT [facility]) - Abnormal; Notable for the following:    Color, Urine AMBER (*)    Specific Gravity, Urine 1.031 (*)    Bilirubin Urine SMALL (*)    Ketones, ur 15 (*)    Leukocytes, UA SMALL (*)    All other components within normal limits  URINE MICROSCOPIC-ADD ON - Abnormal; Notable for the following:    Squamous Epithelial / LPF 0-5 (*)    Bacteria, UA MANY (*)    All other components within normal limits  URINE CULTURE  COMPREHENSIVE METABOLIC PANEL  I-STAT BETA HCG BLOOD, ED ([facility], [facility], [facility] ONLY)  GC/CHLAMYDIA PROBE AMP ([place]) NOT AT [facility]    Imaging Review No results found. I have personally reviewed and evaluated these lab results as part of my medical decision-making.   EKG Interpretation None      MDM   Final diagnoses:  Bacterial vaginosis  Pt arrives for vaginal discharge and abdominal pain. Patient is dehydrated and has few clue cells. I will treat for bacterial vaginosis with Flagyl. Pt is advised to follow up with PCP or OB/GYN for continued symptoms. Pt appears safe for discharge. Patient left AMA without telling the nurse or the provider. She was not given the results of her lab work prior to leaving. ,I personally performed the services described in this documentation, which was scribed in my presence. The recorded information has been reviewed and is accurate.     Catha Gosselin, PA-C 04/27/15 [id number]  Richardean Canal,  MD 04/28/15 [phone]

## 2015-04-28 ENCOUNTER — Ambulatory Visit (INDEPENDENT_AMBULATORY_CARE_PROVIDER_SITE_OTHER): Payer: Medicaid Other | Admitting: Obstetrics

## 2015-04-28 ENCOUNTER — Encounter: Payer: Self-pay | Admitting: Obstetrics

## 2015-04-28 VITALS — BP 113/78 | HR 85 | Temp 97.8°F | Wt 231.0 lb

## 2015-04-28 DIAGNOSIS — N760 Acute vaginitis: Secondary | ICD-10-CM | POA: Diagnosis not present

## 2015-04-28 DIAGNOSIS — N898 Other specified noninflammatory disorders of vagina: Secondary | ICD-10-CM

## 2015-04-28 DIAGNOSIS — B373 Candidiasis of vulva and vagina: Secondary | ICD-10-CM | POA: Diagnosis not present

## 2015-04-28 DIAGNOSIS — A499 Bacterial infection, unspecified: Secondary | ICD-10-CM | POA: Diagnosis not present

## 2015-04-28 DIAGNOSIS — B3731 Acute candidiasis of vulva and vagina: Secondary | ICD-10-CM

## 2015-04-28 DIAGNOSIS — B9689 Other specified bacterial agents as the cause of diseases classified elsewhere: Secondary | ICD-10-CM

## 2015-04-28 LAB — URINE CULTURE: Special Requests: NORMAL

## 2015-04-28 LAB — GC/CHLAMYDIA PROBE AMP (~~LOC~~) NOT AT ARMC
Chlamydia: NEGATIVE
NEISSERIA GONORRHEA: NEGATIVE

## 2015-04-28 NOTE — Progress Notes (Signed)
Patient ID: Annette Wood, female   DOB: 1991/01/01, 24 y.o.   MRN: [id number]  Chief Complaint  Patient presents with  . Vaginal Discharge    thick milky discharge    HPI Annette Wood is a 24 y.o. female.  Milky discharge.  Denies itching or odor.  HPI  Past Medical History  Diagnosis Date  . Gastroesophageal reflux   . Chlamydia   . Gonorrhea   . Panic attack   . Gallstones     Past Surgical History  Procedure Laterality Date  . Cholecystectomy N/A 11/24/2012    Procedure: LAPAROSCOPIC CHOLECYSTECTOMY WITH INTRAOPERATIVE CHOLANGIOGRAM;  Surgeon: Mariella SaaBenjamin T Hoxworth, MD;  Location: [facility];  Service: General;  Laterality: N/A;  . Cholecystectomy      Family History  Problem Relation Age of Onset  . Diabetes Maternal Grandmother   . Lupus Maternal Grandmother     Social History Social History  Substance Use Topics  . Smoking status: Never Smoker   . Smokeless tobacco: Never Used  . Alcohol Use: No    No Known Allergies  No current outpatient prescriptions on file.   No current facility-administered medications for this visit.    Review of Systems Review of Systems Constitutional: negative for fatigue and weight loss Respiratory: negative for cough and wheezing Cardiovascular: negative for chest pain, fatigue and palpitations Gastrointestinal: negative for abdominal pain and change in bowel habits Genitourinary:positive for vaginal discharge Integument/breast: negative for nipple discharge Musculoskeletal:negative for myalgias Neurological: negative for gait problems and tremors Behavioral/Psych: negative for abusive relationship, depression Endocrine: negative for temperature intolerance     Blood pressure 113/78, pulse 85, temperature 97.8 F (36.6 C), weight 231 lb (104.781 kg), last menstrual period 03/30/2015.  Physical Exam Physical Exam           General:  Alert and no distress Abdomen:  normal findings: no organomegaly, soft, non-tender and  no hernia  Pelvis:  External genitalia: normal general appearance Urinary system: urethral meatus normal and bladder without fullness, nontender Vaginal: normal without tenderness, induration or masses Cervix: normal appearance Adnexa: normal bimanual exam Uterus: anteverted and non-tender, normal size      Data Reviewed Wet prep  Assessment     BV     Plan  Flagyl Rx F/U prn    No orders of the defined types were placed in this encounter.   No orders of the defined types were placed in this encounter.

## 2015-06-12 ENCOUNTER — Ambulatory Visit (INDEPENDENT_AMBULATORY_CARE_PROVIDER_SITE_OTHER): Payer: Medicaid Other | Admitting: Obstetrics

## 2015-06-12 ENCOUNTER — Encounter: Payer: Self-pay | Admitting: Obstetrics

## 2015-06-12 VITALS — BP 116/87 | HR 76 | Temp 98.1°F | Wt 229.0 lb

## 2015-06-12 DIAGNOSIS — N898 Other specified noninflammatory disorders of vagina: Secondary | ICD-10-CM | POA: Diagnosis not present

## 2015-06-12 NOTE — Progress Notes (Signed)
Patient ID: Annette Wood, female   DOB: 09-20-1990, 25 y.o.   MRN: [id number]  Chief Complaint  Patient presents with  . Vaginal Discharge    Milky discharge, denies odor, and itching    HPI Annette Wood is a 25 y.o. female.  Milky vaginal discharge.  Denies vaginal odor or itching/irritation.  HPI  Past Medical History  Diagnosis Date  . Gastroesophageal reflux   . Chlamydia   . Gonorrhea   . Panic attack   . Gallstones     Past Surgical History  Procedure Laterality Date  . Cholecystectomy N/A 11/24/2012    Procedure: LAPAROSCOPIC CHOLECYSTECTOMY WITH INTRAOPERATIVE CHOLANGIOGRAM;  Surgeon: Mariella Saa, MD;  Location: [facility];  Service: General;  Laterality: N/A;  . Cholecystectomy      Family History  Problem Relation Age of Onset  . Diabetes Maternal Grandmother   . Lupus Maternal Grandmother     Social History Social History  Substance Use Topics  . Smoking status: Never Smoker   . Smokeless tobacco: Never Used  . Alcohol Use: No    No Known Allergies  No current outpatient prescriptions on file.   No current facility-administered medications for this visit.    Review of Systems Review of Systems Constitutional: negative for fatigue and weight loss Respiratory: negative for cough and wheezing Cardiovascular: negative for chest pain, fatigue and palpitations Gastrointestinal: negative for abdominal pain and change in bowel habits Genitourinary:negative Integument/breast: negative for nipple discharge Musculoskeletal:negative for myalgias Neurological: negative for gait problems and tremors Behavioral/Psych: negative for abusive relationship, depression Endocrine: negative for temperature intolerance     Blood pressure 116/87, pulse 76, temperature 98.1 F (36.7 C), weight 229 lb (103.874 kg).  Physical Exam Physical Exam           General:  Alert and no distress Abdomen:  normal findings: no organomegaly, soft, non-tender and no hernia   Pelvis:  External genitalia: normal general appearance Urinary system: urethral meatus normal and bladder without fullness, nontender Vaginal: normal without tenderness, induration or masses Cervix: normal appearance Adnexa: normal bimanual exam Uterus: anteverted and non-tender, normal size      Data Reviewed Previous labs  Assessment     Vaginal discharge     Plan    Wet prep and cultures ordered.  Will treat with results. F/U prn   Orders Placed This Encounter  Procedures  . SureSwab, Vaginosis/Vaginitis Plus   No orders of the defined types were placed in this encounter.

## 2015-06-17 LAB — SURESWAB, VAGINOSIS/VAGINITIS PLUS
ATOPOBIUM VAGINAE: 7 Log (cells/mL)
C. albicans, DNA: NOT DETECTED
C. glabrata, DNA: NOT DETECTED
C. parapsilosis, DNA: DETECTED — AB
C. trachomatis RNA, TMA: NOT DETECTED
C. tropicalis, DNA: NOT DETECTED
Gardnerella vaginalis: >8 Log (cells/mL)
LACTOBACILLUS SPECIES: NOT DETECTED Log (cells/mL)
MEGASPHAERA SPECIES: >8 Log (cells/mL)
N. GONORRHOEAE RNA, TMA: NOT DETECTED
T. VAGINALIS RNA, QL TMA: NOT DETECTED

## 2015-06-19 ENCOUNTER — Other Ambulatory Visit: Payer: Self-pay | Admitting: Obstetrics

## 2015-06-19 DIAGNOSIS — B9689 Other specified bacterial agents as the cause of diseases classified elsewhere: Secondary | ICD-10-CM

## 2015-06-19 DIAGNOSIS — B3731 Acute candidiasis of vulva and vagina: Secondary | ICD-10-CM

## 2015-06-19 DIAGNOSIS — B373 Candidiasis of vulva and vagina: Secondary | ICD-10-CM

## 2015-06-19 DIAGNOSIS — N760 Acute vaginitis: Principal | ICD-10-CM

## 2015-06-19 MED ORDER — FLUCONAZOLE 150 MG PO TABS: 150.0000 mg | ORAL_TABLET | Freq: Once | ORAL | Status: DC

## 2015-06-19 MED ORDER — METRONIDAZOLE 500 MG PO TABS: 500.0000 mg | ORAL_TABLET | Freq: Two times a day (BID) | ORAL | Status: DC

## 2015-06-27 ENCOUNTER — Institutional Professional Consult (permissible substitution): Payer: Medicaid Other

## 2015-07-04 ENCOUNTER — Institutional Professional Consult (permissible substitution): Payer: Medicaid Other

## 2015-07-25 ENCOUNTER — Encounter: Payer: Self-pay | Admitting: Obstetrics

## 2015-07-25 ENCOUNTER — Ambulatory Visit (INDEPENDENT_AMBULATORY_CARE_PROVIDER_SITE_OTHER): Payer: Medicaid Other | Admitting: Obstetrics

## 2015-07-25 VITALS — BP 118/89 | HR 83 | Temp 98.3°F | Wt 230.0 lb

## 2015-07-25 DIAGNOSIS — Z01419 Encounter for gynecological examination (general) (routine) without abnormal findings: Secondary | ICD-10-CM

## 2015-07-25 DIAGNOSIS — IMO0002 Reserved for concepts with insufficient information to code with codable children: Secondary | ICD-10-CM

## 2015-07-25 DIAGNOSIS — R896 Abnormal cytological findings in specimens from other organs, systems and tissues: Secondary | ICD-10-CM | POA: Diagnosis not present

## 2015-07-25 NOTE — Addendum Note (Signed)
Addended by: Marya LandryFOSTER, Symon Norwood D on: 07/25/2015 04:10 PM   Modules accepted: Orders

## 2015-07-25 NOTE — Progress Notes (Signed)
Patient ID: Annette Wood, female   DOB: 11-18-1990, 25 y.o.   MRN: [id number]  Chief Complaint  Patient presents with  . Follow-up    Repeat pap    HPI Annette Wood is a 25 y.o. female.  H/O HGSIL.  S/P Cryo. In 2016.  Presents for repeat pap smear today as part of Dysplasia Protocol after Cryo.                No complaints.  HPI  Past Medical History  Diagnosis Date  . Gastroesophageal reflux   . Chlamydia   . Gonorrhea   . Panic attack   . Gallstones     Past Surgical History  Procedure Laterality Date  . Cholecystectomy N/A 11/24/2012    Procedure: LAPAROSCOPIC CHOLECYSTECTOMY WITH INTRAOPERATIVE CHOLANGIOGRAM;  Surgeon: Mariella SaaBenjamin T Hoxworth, MD;  Location: [facility];  Service: General;  Laterality: N/A;  . Cholecystectomy      Family History  Problem Relation Age of Onset  . Diabetes Maternal Grandmother   . Lupus Maternal Grandmother     Social History Social History  Substance Use Topics  . Smoking status: Never Smoker   . Smokeless tobacco: Never Used  . Alcohol Use: No    No Known Allergies  Current Outpatient Prescriptions  Medication Sig Dispense Refill  . sertraline (ZOLOFT) 50 MG tablet Take 50 mg by mouth daily.     No current facility-administered medications for this visit.    Review of Systems Review of Systems Constitutional: negative for fatigue and weight loss Respiratory: negative for cough and wheezing Cardiovascular: negative for chest pain, fatigue and palpitations Gastrointestinal: negative for abdominal pain and change in bowel habits Genitourinary:negative Integument/breast: negative for nipple discharge Musculoskeletal:negative for myalgias Neurological: negative for gait problems and tremors Behavioral/Psych: negative for abusive relationship, depression Endocrine: negative for temperature intolerance     Blood pressure 118/89, pulse 83, temperature 98.3 F (36.8 C), weight 230 lb (104.327 kg).  Physical Exam Physical Exam            General:  Alert and no distress Abdomen:  normal findings: no organomegaly, soft, non-tender and no hernia  Pelvis:  External genitalia: normal general appearance Urinary system: urethral meatus normal and bladder without fullness, nontender Vaginal: normal without tenderness, induration or masses Cervix: normal appearance Adnexa: normal bimanual exam Uterus: anteverted and non-tender, normal size     Data Reviewed Paps  Assessment     HGSIL.  S/P Cryocautery of Cervix.     Plan    Repeat pap done, per Dysplasia Protocol. Repeat pap 4 months  No orders of the defined types were placed in this encounter.   Meds ordered this encounter  Medications  . sertraline (ZOLOFT) 50 MG tablet    Sig: Take 50 mg by mouth daily.

## 2015-07-25 NOTE — Addendum Note (Signed)
Addended by: Marya LandryFOSTER, Yacob Wilkerson D on: 07/25/2015 12:06 PM   Modules accepted: Orders

## 2015-07-26 LAB — PAP IG W/ RFLX HPV ASCU

## 2015-07-28 LAB — SURESWAB, VAGINOSIS/VAGINITIS PLUS
Atopobium vaginae: NOT DETECTED Log (cells/mL)
BV CATEGORY: UNDETERMINED — AB
C. GLABRATA, DNA: NOT DETECTED
C. PARAPSILOSIS, DNA: DETECTED — AB
C. TROPICALIS, DNA: NOT DETECTED
C. albicans, DNA: NOT DETECTED
C. trachomatis RNA, TMA: NOT DETECTED
GARDNERELLA VAGINALIS: 6.2 Log (cells/mL)
LACTOBACILLUS SPECIES: 7.2 Log (cells/mL)
MEGASPHAERA SPECIES: NOT DETECTED Log (cells/mL)
N. gonorrhoeae RNA, TMA: NOT DETECTED
T. VAGINALIS RNA, QL TMA: NOT DETECTED

## 2015-07-28 LAB — HUMAN PAPILLOMAVIRUS, HIGH RISK: HPV DNA High Risk: NOT DETECTED

## 2015-07-29 ENCOUNTER — Other Ambulatory Visit: Payer: Self-pay | Admitting: Obstetrics

## 2015-07-29 DIAGNOSIS — N760 Acute vaginitis: Principal | ICD-10-CM

## 2015-07-29 DIAGNOSIS — B373 Candidiasis of vulva and vagina: Secondary | ICD-10-CM

## 2015-07-29 DIAGNOSIS — B3731 Acute candidiasis of vulva and vagina: Secondary | ICD-10-CM

## 2015-07-29 DIAGNOSIS — B9689 Other specified bacterial agents as the cause of diseases classified elsewhere: Secondary | ICD-10-CM

## 2015-07-29 MED ORDER — METRONIDAZOLE 500 MG PO TABS: 500.0000 mg | ORAL_TABLET | Freq: Two times a day (BID) | ORAL | Status: DC

## 2015-07-29 MED ORDER — FLUCONAZOLE 150 MG PO TABS: 150.0000 mg | ORAL_TABLET | Freq: Once | ORAL | Status: DC

## 2015-08-01 ENCOUNTER — Telehealth: Payer: Self-pay | Admitting: *Deleted

## 2015-08-01 ENCOUNTER — Other Ambulatory Visit: Payer: Self-pay | Admitting: Obstetrics

## 2015-08-01 ENCOUNTER — Other Ambulatory Visit: Payer: Self-pay | Admitting: *Deleted

## 2015-08-01 DIAGNOSIS — F32A Depression, unspecified: Secondary | ICD-10-CM

## 2015-08-01 DIAGNOSIS — F329 Major depressive disorder, single episode, unspecified: Secondary | ICD-10-CM

## 2015-08-01 MED ORDER — SERTRALINE HCL 50 MG PO TABS
50.0000 mg | ORAL_TABLET | Freq: Every day | ORAL | Status: DC
Start: 2015-08-01 — End: 2016-09-17

## 2015-08-01 NOTE — Telephone Encounter (Signed)
Patient needs a refill on her antidepressant. She has been seeing [facility] and reports that she is comfortable seeing them in our office. Patient thinks that she does need an increase in her dosing. Told her she would need to discuss increasing with her councilor before we could increase dosing.

## 2015-09-19 ENCOUNTER — Ambulatory Visit: Payer: Medicaid Other | Admitting: Obstetrics

## 2015-09-25 ENCOUNTER — Ambulatory Visit: Payer: Medicaid Other | Admitting: Obstetrics

## 2015-10-12 ENCOUNTER — Ambulatory Visit: Payer: Medicaid Other | Admitting: Obstetrics

## 2015-10-30 DIAGNOSIS — Z113 Encounter for screening for infections with a predominantly sexual mode of transmission: Secondary | ICD-10-CM | POA: Insufficient documentation

## 2015-10-30 DIAGNOSIS — Z5321 Procedure and treatment not carried out due to patient leaving prior to being seen by health care provider: Secondary | ICD-10-CM | POA: Diagnosis not present

## 2015-10-31 ENCOUNTER — Emergency Department (HOSPITAL_COMMUNITY)
Admission: EM | Admit: 2015-10-31 | Discharge: 2015-10-31 | Disposition: A | Discharge status: Home / Self Care | Payer: Medicaid Other | Attending: Emergency Medicine | Admitting: Emergency Medicine

## 2015-10-31 ENCOUNTER — Encounter (HOSPITAL_COMMUNITY): Payer: Self-pay | Admitting: *Deleted

## 2015-10-31 LAB — URINE MICROSCOPIC-ADD ON

## 2015-10-31 LAB — URINALYSIS, ROUTINE W REFLEX MICROSCOPIC
Bilirubin Urine: NEGATIVE
Glucose, UA: NEGATIVE mg/dL
Hgb urine dipstick: NEGATIVE
KETONES UR: 15 mg/dL — AB
LEUKOCYTES UA: NEGATIVE
Nitrite: NEGATIVE
Protein, ur: NEGATIVE mg/dL
SPECIFIC GRAVITY, URINE: 1.035 — AB (ref 1.005–1.030)
pH: 6.5 (ref 5.0–8.0)

## 2015-10-31 LAB — HIV ANTIBODY (ROUTINE TESTING W REFLEX): HIV SCREEN 4TH GENERATION: NONREACTIVE

## 2015-10-31 LAB — RPR: RPR Ser Ql: NONREACTIVE

## 2015-10-31 LAB — PREGNANCY, URINE: PREG TEST UR: NEGATIVE

## 2015-10-31 NOTE — ED Notes (Signed)
Patient presents stating her partner has been treated for an STD and they had unprotected intercourse  States no unusual discharge but "feels funny down there"

## 2015-11-09 ENCOUNTER — Ambulatory Visit (INDEPENDENT_AMBULATORY_CARE_PROVIDER_SITE_OTHER): Payer: Medicaid Other | Admitting: Obstetrics

## 2015-11-09 ENCOUNTER — Encounter: Payer: Self-pay | Admitting: Obstetrics

## 2015-11-09 VITALS — BP 114/75 | HR 74 | Temp 98.5°F | Wt 228.0 lb

## 2015-11-09 DIAGNOSIS — Z113 Encounter for screening for infections with a predominantly sexual mode of transmission: Secondary | ICD-10-CM

## 2015-11-09 DIAGNOSIS — Z1389 Encounter for screening for other disorder: Secondary | ICD-10-CM

## 2015-11-09 DIAGNOSIS — Z3040 Encounter for surveillance of contraceptives, unspecified: Secondary | ICD-10-CM

## 2015-11-09 DIAGNOSIS — Z01419 Encounter for gynecological examination (general) (routine) without abnormal findings: Secondary | ICD-10-CM | POA: Diagnosis not present

## 2015-11-09 DIAGNOSIS — Z0000 Encounter for general adult medical examination without abnormal findings: Secondary | ICD-10-CM | POA: Diagnosis not present

## 2015-11-09 DIAGNOSIS — N760 Acute vaginitis: Secondary | ICD-10-CM

## 2015-11-09 LAB — POCT URINALYSIS DIPSTICK
Bilirubin, UA: NEGATIVE
Glucose, UA: NEGATIVE
Ketones, UA: NEGATIVE
Leukocytes, UA: NEGATIVE
Nitrite, UA: NEGATIVE
PH UA: 7
SPEC GRAV UA: 1.01
UROBILINOGEN UA: NEGATIVE

## 2015-11-09 MED ORDER — NORETHINDRONE 0.35 MG PO TABS: 1.0000 | ORAL_TABLET | Freq: Every day | ORAL | Status: DC

## 2015-11-09 NOTE — Addendum Note (Signed)
Addended by: Marya LandryFOSTER, Kinza Gouveia D on: 11/09/2015 12:31 PM   Modules accepted: Orders

## 2015-11-09 NOTE — Progress Notes (Signed)
Subjective:        Annette Wood is a 25 y.o. female here for a routine exam.  Current complaints: vaginal discharge.    Personal health questionnaire:  Is patient Ashkenazi Jewish, have a family history of breast and/or ovarian cancer: no Is there a family history of uterine cancer diagnosed at age < 3750, gastrointestinal cancer, urinary tract cancer, family member who is a Personnel officerLynch syndrome-associated carrier: no Is the patient overweight and hypertensive, family history of diabetes, personal history of gestational diabetes, preeclampsia or PCOS: no Is patient over 7555, have PCOS,  family history of premature CHD under age 24, diabetes, smoke, have hypertension or peripheral artery disease:  no At any time, has a partner hit, kicked or otherwise hurt or frightened you?: no Over the past 2 weeks, have you felt down, depressed or hopeless?: no Over the past 2 weeks, have you felt little interest or pleasure in doing things?:no   Gynecologic History No LMP recorded. Patient has had an implant. Contraception: Nexplanon Last Pap: 2017. Results were: ASCUS with negative High Risk HPV Last mammogram: n/a. Results were: n/a  Obstetric History OB History  Gravida Para Term Preterm AB SAB TAB Ectopic Multiple Living  3 3 3       3     # Outcome Date GA Lbr Len/2nd Weight Sex Delivery Anes PTL Lv  3 Term 04/01/13 4558w1d 05:40 / 00:15 7 lb 1.6 oz (3.221 kg) F Vag-Spont EPI  Y  2 Term 09/20/09 5956w0d  7 lb 8 oz (3.402 kg) F Vag-Spont EPI N Y  1 Term 06/09/08 8156w0d  6 lb 3 oz (2.807 kg) M Vag-Spont None N Y     Comments: delivered in car      Past Medical History  Diagnosis Date  . Gastroesophageal reflux   . Chlamydia   . Gonorrhea   . Panic attack   . Gallstones     Past Surgical History  Procedure Laterality Date  . Cholecystectomy N/A 11/24/2012    Procedure: LAPAROSCOPIC CHOLECYSTECTOMY WITH INTRAOPERATIVE CHOLANGIOGRAM;  Surgeon: Mariella SaaBenjamin T Hoxworth, MD;  Location: [facility];   Service: General;  Laterality: N/A;  . Cholecystectomy       Current outpatient prescriptions:  .  sertraline (ZOLOFT) 50 MG tablet, Take 1 tablet (50 mg total) by mouth daily., Disp: 30 tablet, Rfl: 5 No Known Allergies  Social History  Substance Use Topics  . Smoking status: Never Smoker   . Smokeless tobacco: Never Used  . Alcohol Use: No    Family History  Problem Relation Age of Onset  . Diabetes Maternal Grandmother   . Lupus Maternal Grandmother       Review of Systems  Constitutional: negative for fatigue and weight loss Respiratory: negative for cough and wheezing Cardiovascular: negative for chest pain, fatigue and palpitations Gastrointestinal: negative for abdominal pain and change in bowel habits Musculoskeletal:negative for myalgias Neurological: negative for gait problems and tremors Behavioral/Psych: negative for abusive relationship, depression Endocrine: negative for temperature intolerance   Genitourinary:negative for abnormal menstrual periods, genital lesions, hot flashes, sexual problems. Positive for vaginal discharge Integument/breast: negative for breast lump, breast tenderness, nipple discharge and skin lesion(s)    Objective:       BP 114/75 mmHg  Pulse 74  Temp(Src) 98.5 F (36.9 C)  Wt 228 lb (103.42 kg)                       General:  Alert and no  distress Abdomen:  normal findings: no organomegaly, soft, non-tender and no hernia  Pelvis:  External genitalia: normal general appearance Urinary system: urethral meatus normal and bladder without fullness, nontender Vaginal: normal without tenderness, induration or masses.  Scant menstrual blood Cervix: normal appearance Adnexa: normal bimanual exam Uterus: anteverted and non-tender, normal size   Lab Review Urine pregnancy test Labs reviewed yes Radiologic studies reviewed no    Assessment:    Vaginitis   Plan:     Wet prep and cultures sent  Education reviewed: low fat, low  cholesterol diet, safe sex/STD prevention and weight bearing exercise. Contraception: Nexplanon. Follow up in: 4 months.   Meds ordered this encounter  Medications  . DISCONTD: norethindrone (ORTHO MICRONOR) 0.35 MG tablet    Sig: Take 1 tablet (0.35 mg total) by mouth daily.    Dispense:  1 Package    Refill:  11   Orders Placed This Encounter  Procedures  . POCT urinalysis dipstick

## 2015-11-13 ENCOUNTER — Other Ambulatory Visit: Payer: Self-pay | Admitting: Obstetrics

## 2015-11-13 DIAGNOSIS — N760 Acute vaginitis: Principal | ICD-10-CM

## 2015-11-13 DIAGNOSIS — B9689 Other specified bacterial agents as the cause of diseases classified elsewhere: Secondary | ICD-10-CM

## 2015-11-13 LAB — NUSWAB VG+, CANDIDA 6SP
CANDIDA KRUSEI, NAA: NEGATIVE
CANDIDA LUSITANIAE, NAA: NEGATIVE
Candida albicans, NAA: NEGATIVE
Candida glabrata, NAA: NEGATIVE
Candida parapsilosis, NAA: NEGATIVE
Candida tropicalis, NAA: NEGATIVE
Chlamydia trachomatis, NAA: NEGATIVE
Neisseria gonorrhoeae, NAA: NEGATIVE
TRICH VAG BY NAA: NEGATIVE

## 2015-11-13 MED ORDER — METRONIDAZOLE 500 MG PO TABS: 500.0000 mg | ORAL_TABLET | Freq: Two times a day (BID) | ORAL | Status: DC

## 2015-11-14 ENCOUNTER — Telehealth (HOSPITAL_COMMUNITY): Payer: Self-pay | Admitting: *Deleted

## 2015-11-15 ENCOUNTER — Telehealth: Payer: Self-pay | Admitting: *Deleted

## 2015-11-15 NOTE — Telephone Encounter (Signed)
Pt returned call to office for lab resutls.  Attempt to contact pt. No answer, LM on VM to call office.

## 2015-11-17 NOTE — Telephone Encounter (Signed)
Pt called to office re: Rx  Return call to pt. Pt states that she has Rx at pharmacy and would like to know her lab results. Pt made aware of results, Swab was negative. Pt advised that she did not need medication at this time.  It was not noted on result need for Rx. Pt states understanding.

## 2015-12-06 NOTE — Telephone Encounter (Signed)
See phone note for this encounter. 

## 2015-12-08 ENCOUNTER — Inpatient Hospital Stay (HOSPITAL_COMMUNITY)
Admission: AD | Admit: 2015-12-08 | Discharge: 2015-12-08 | Disposition: A | Discharge status: Home / Self Care | Payer: Medicaid Other | Source: Ambulatory Visit | Attending: Obstetrics and Gynecology | Admitting: Obstetrics and Gynecology

## 2015-12-08 ENCOUNTER — Encounter (HOSPITAL_COMMUNITY): Payer: Self-pay | Admitting: *Deleted

## 2015-12-08 DIAGNOSIS — A549 Gonococcal infection, unspecified: Secondary | ICD-10-CM | POA: Insufficient documentation

## 2015-12-08 DIAGNOSIS — A749 Chlamydial infection, unspecified: Secondary | ICD-10-CM | POA: Insufficient documentation

## 2015-12-08 DIAGNOSIS — N939 Abnormal uterine and vaginal bleeding, unspecified: Secondary | ICD-10-CM | POA: Diagnosis present

## 2015-12-08 DIAGNOSIS — K219 Gastro-esophageal reflux disease without esophagitis: Secondary | ICD-10-CM | POA: Diagnosis not present

## 2015-12-08 DIAGNOSIS — Z9049 Acquired absence of other specified parts of digestive tract: Secondary | ICD-10-CM | POA: Insufficient documentation

## 2015-12-08 DIAGNOSIS — F410 Panic disorder [episodic paroxysmal anxiety] without agoraphobia: Secondary | ICD-10-CM | POA: Insufficient documentation

## 2015-12-08 DIAGNOSIS — IMO0001 Reserved for inherently not codable concepts without codable children: Secondary | ICD-10-CM

## 2015-12-08 DIAGNOSIS — B9689 Other specified bacterial agents as the cause of diseases classified elsewhere: Secondary | ICD-10-CM

## 2015-12-08 DIAGNOSIS — N760 Acute vaginitis: Secondary | ICD-10-CM

## 2015-12-08 LAB — WET PREP, GENITAL
Sperm: NONE SEEN
Trich, Wet Prep: NONE SEEN
Yeast Wet Prep HPF POC: NONE SEEN

## 2015-12-08 LAB — URINALYSIS, ROUTINE W REFLEX MICROSCOPIC
Bilirubin Urine: NEGATIVE
Glucose, UA: NEGATIVE mg/dL
Ketones, ur: NEGATIVE mg/dL
Leukocytes, UA: NEGATIVE
NITRITE: NEGATIVE
PROTEIN: NEGATIVE mg/dL
SPECIFIC GRAVITY, URINE: 1.03 (ref 1.005–1.030)
pH: 5.5 (ref 5.0–8.0)

## 2015-12-08 LAB — URINE MICROSCOPIC-ADD ON: WBC, UA: NONE SEEN WBC/hpf (ref 0–5)

## 2015-12-08 LAB — POCT PREGNANCY, URINE: PREG TEST UR: NEGATIVE

## 2015-12-08 NOTE — MAU Note (Signed)
May be having a miscarrige but unsure if pregnant. Dark brown bleeding for 2 days and some lower abd pain. Has not done upt. LMP 11/04/15

## 2015-12-08 NOTE — MAU Provider Note (Signed)
History     CSN: [id number]  Arrival date and time: 12/08/15 [id number]   First Provider Initiated Contact with Patient 12/08/15 1937      Chief Complaint  Patient presents with  . Vaginal Bleeding   HPI  Patient presents today after contacting her gynecologist regarding some heavier bleeding than normal. At first she thought it was her normal. She was due to start on the 17th although she notes that she's had irregular bleeding for the past several months. She has no other complaints. She denies fevers chills. She denies any abnormal vaginal discharge but does note she was recently treated for bacterial vaginosis with metronidazole. She states she's having normal abdominal cramping that usually comes with her menses.   Pertinent Gynecological History: Menses: flow is light, usually lasting 6 to 7 days and with minimal cramping Bleeding: Irregular after having maximum placed Contraception: Nexplanon DES exposure: denies Blood transfusions: none Sexually transmitted diseases: currently at risk    Past Medical History  Diagnosis Date  . Gastroesophageal reflux   . Chlamydia   . Gonorrhea   . Panic attack   . Gallstones     Past Surgical History  Procedure Laterality Date  . Cholecystectomy N/A 11/24/2012    Procedure: LAPAROSCOPIC CHOLECYSTECTOMY WITH INTRAOPERATIVE CHOLANGIOGRAM;  Surgeon: Mariella SaaBenjamin T Hoxworth, MD;  Location: [facility];  Service: General;  Laterality: N/A;  . Cholecystectomy      Family History  Problem Relation Age of Onset  . Diabetes Maternal Grandmother   . Lupus Maternal Grandmother     Social History  Substance Use Topics  . Smoking status: Never Smoker   . Smokeless tobacco: Never Used  . Alcohol Use: No    Allergies: No Known Allergies  Prescriptions prior to admission  Medication Sig Dispense Refill Last Dose  . metroNIDAZOLE (FLAGYL) 500 MG tablet Take 1 tablet (500 mg total) by mouth 2 (two) times daily. 14 tablet 2   . sertraline (ZOLOFT)  50 MG tablet Take 1 tablet (50 mg total) by mouth daily. 30 tablet 5 Taking    Review of Systems  Constitutional: Negative for fever, chills and weight loss.  HENT: Negative for congestion.   Eyes: Negative for blurred vision, double vision and photophobia.  Respiratory: Negative for cough, hemoptysis and sputum production.   Cardiovascular: Negative for chest pain and palpitations.  Gastrointestinal: Negative for heartburn, nausea and vomiting.  Genitourinary: Negative for dysuria, urgency and frequency.  Musculoskeletal: Negative for myalgias, back pain and neck pain.  Skin: Negative for itching and rash.  Neurological: Positive for headaches. Negative for dizziness, tingling and tremors.  Psychiatric/Behavioral: Negative for depression, suicidal ideas and substance abuse.   Physical Exam   Blood pressure 141/82, pulse 89, temperature 98 F (36.7 C), resp. rate 18, height 5\' 3"  (1.6 m), weight 224 lb 3.2 oz (101.696 kg), last menstrual period 11/04/2015.  Physical Exam  Constitutional: She is oriented to person, place, and time. She appears well-developed and well-nourished.  HENT:  Head: Normocephalic and atraumatic.  Cardiovascular: Normal rate and regular rhythm.   Respiratory: Effort normal and breath sounds normal. No respiratory distress.  GI: Soft. She exhibits no distension. There is no tenderness. There is no rebound.  Genitourinary: Vagina normal and uterus normal.  Minimal old blood clots With minimal bright red blood in the vaginal vault  Neurological: She is alert and oriented to person, place, and time. She has normal reflexes.  Skin: Skin is warm and dry.  Psychiatric: She has a normal  mood and affect. Her behavior is normal.    MAU Course  Procedures  In the MAU patient had a speculum exam which revealed minimal blood in the vaginal vault. She had no other findings. She was concerned about possibility of pregnancy or other significant abnormality causing her  bleeding and was reassured when I told her I thought this was her normal period. Her wet prep did come back positive for BV. When informed she reports she is taking gel as prescribed by her primary OB.   Assessment and Plan  #1: Onset of menses. Patient with slightly delayed onset of menses. Pregnancy test is negative and On his in place. Did send cultures for gonorrhea and chlamydia as well as wet prep. Wet prep revealed clue cells but on informing pt she reports she is actively receiving treatment with gel. Patient can follow up with her primary gynecologist if bleeding remains heavy.  Ernestina Penna 12/08/2015, 7:55 PM

## 2015-12-08 NOTE — Discharge Instructions (Signed)
Please take you vaginal gel as prescribed by your OB until it is gone.   Bacterial Vaginosis Bacterial vaginosis is a vaginal infection that occurs when the normal balance of bacteria in the vagina is disrupted. It results from an overgrowth of certain bacteria. This is the most common vaginal infection in women of childbearing age. Treatment is important to prevent complications, especially in pregnant women, as it can cause a premature delivery. CAUSES  Bacterial vaginosis is caused by an increase in harmful bacteria that are normally present in smaller amounts in the vagina. Several different kinds of bacteria can cause bacterial vaginosis. However, the reason that the condition develops is not fully understood. RISK FACTORS Certain activities or behaviors can put you at an increased risk of developing bacterial vaginosis, including:  Having a new sex partner or multiple sex partners.  Douching.  Using an intrauterine device (IUD) for contraception. Women do not get bacterial vaginosis from toilet seats, bedding, swimming pools, or contact with objects around them. SIGNS AND SYMPTOMS  Some women with bacterial vaginosis have no signs or symptoms. Common symptoms include:  Grey vaginal discharge.  A fishlike odor with discharge, especially after sexual intercourse.  Itching or burning of the vagina and vulva.  Burning or pain with urination. DIAGNOSIS  Your health care provider will take a medical history and examine the vagina for signs of bacterial vaginosis. A sample of vaginal fluid may be taken. Your health care provider will look at this sample under a microscope to check for bacteria and abnormal cells. A vaginal pH test may also be done.  TREATMENT  Bacterial vaginosis may be treated with antibiotic medicines. These may be given in the form of a pill or a vaginal cream. A second round of antibiotics may be prescribed if the condition comes back after treatment. Because bacterial  vaginosis increases your risk for sexually transmitted diseases, getting treated can help reduce your risk for chlamydia, gonorrhea, HIV, and herpes. HOME CARE INSTRUCTIONS   Only take over-the-counter or prescription medicines as directed by your health care provider.  If antibiotic medicine was prescribed, take it as directed. Make sure you finish it even if you start to feel better.  Tell all sexual partners that you have a vaginal infection. They should see their health care provider and be treated if they have problems, such as a mild rash or itching.  During treatment, it is important that you follow these instructions:  Avoid sexual activity or use condoms correctly.  Do not douche.  Avoid alcohol as directed by your health care provider.  Avoid breastfeeding as directed by your health care provider. SEEK MEDICAL CARE IF:   Your symptoms are not improving after 3 days of treatment.  You have increased discharge or pain.  You have a fever. MAKE SURE YOU:   Understand these instructions.  Will watch your condition.  Will get help right away if you are not doing well or get worse. FOR MORE INFORMATION  Centers for Disease Control and Prevention, Division of STD Prevention: SolutionApps.co.zawww.cdc.gov/std American Sexual Health Association (ASHA): www.ashastd.org    This information is not intended to replace advice given to you by your health care provider. Make sure you discuss any questions you have with your health care provider.   Document Released: 05/06/2005 Document Revised: 05/27/2014 Document Reviewed: 12/16/2012 Elsevier Interactive Patient Education [employer]2016 Elsevier Inc.

## 2015-12-11 LAB — GC/CHLAMYDIA PROBE AMP (~~LOC~~) NOT AT ARMC
CHLAMYDIA, DNA PROBE: NEGATIVE
Neisseria Gonorrhea: NEGATIVE

## 2016-01-11 ENCOUNTER — Other Ambulatory Visit: Payer: Self-pay | Admitting: Obstetrics

## 2016-01-11 DIAGNOSIS — B373 Candidiasis of vulva and vagina: Secondary | ICD-10-CM

## 2016-01-11 DIAGNOSIS — B3731 Acute candidiasis of vulva and vagina: Secondary | ICD-10-CM

## 2016-03-12 ENCOUNTER — Ambulatory Visit: Payer: Self-pay | Admitting: Obstetrics

## 2016-03-13 ENCOUNTER — Telehealth: Payer: Self-pay | Admitting: *Deleted

## 2016-03-13 NOTE — Telephone Encounter (Signed)
Patient states she has the Nexplanon and she normally has a cycle, She did not have a cycle this month and she is worried she could be pregnant. Told patient that can be a side effect and for her piece of mind- she should do a pregnancy test. She is due to have it removed in December and she would like another put in- told patient she needs to come in and fill out paperwork so we can order her device so it will be on the shelf with her name on it when she is ready. She agrees.

## 2016-03-26 ENCOUNTER — Encounter: Payer: Self-pay | Admitting: *Deleted

## 2016-03-26 ENCOUNTER — Encounter: Payer: Self-pay | Admitting: Obstetrics

## 2016-03-26 ENCOUNTER — Ambulatory Visit (INDEPENDENT_AMBULATORY_CARE_PROVIDER_SITE_OTHER): Payer: Medicaid Other | Admitting: Obstetrics

## 2016-03-26 VITALS — BP 127/92 | HR 81 | Temp 97.6°F | Wt 236.4 lb

## 2016-03-26 DIAGNOSIS — Z3040 Encounter for surveillance of contraceptives, unspecified: Secondary | ICD-10-CM

## 2016-03-26 DIAGNOSIS — Z01419 Encounter for gynecological examination (general) (routine) without abnormal findings: Secondary | ICD-10-CM

## 2016-03-26 NOTE — Progress Notes (Signed)
Subjective:        Annette Wood Decock is a 25 y.o. female here for a routine exam.  Current complaints: None.    Personal health questionnaire:  Is patient Ashkenazi Jewish, have a family history of breast and/or ovarian cancer: no Is there a family history of uterine cancer diagnosed at age < 2950, gastrointestinal cancer, urinary tract cancer, family member who is a Personnel officerLynch syndrome-associated carrier: no Is the patient overweight and hypertensive, family history of diabetes, personal history of gestational diabetes, preeclampsia or PCOS: no Is patient over 9055, have PCOS,  family history of premature CHD under age 24, diabetes, smoke, have hypertension or peripheral artery disease:  no At any time, has a partner hit, kicked or otherwise hurt or frightened you?: no Over the past 2 weeks, have you felt down, depressed or hopeless?: no Over the past 2 weeks, have you felt little interest or pleasure in doing things?:no   Gynecologic History No LMP recorded. Patient has had an implant. Contraception: Nexplanon Last Pap: 2017. Results were: abnormal ( ASCUS with negative HPV ) Last mammogram: n/a. Results were: n/a  Obstetric History OB History  Gravida Para Term Preterm AB Living  3 3 3     3   SAB TAB Ectopic Multiple Live Births          3    # Outcome Date GA Lbr Len/2nd Weight Sex Delivery Anes PTL Lv  3 Term 04/01/13 7658w1d 05:40 / 00:15 7 lb 1.6 oz (3.221 kg) F Vag-Spont EPI  LIV  2 Term 09/20/09 289w0d  7 lb 8 oz (3.402 kg) F Vag-Spont EPI N LIV  1 Term 06/09/08 229w0d  6 lb 3 oz (2.807 kg) Wood Vag-Spont None N LIV     Birth Comments: delivered in car      Past Medical History:  Diagnosis Date  . Chlamydia   . Gallstones   . Gastroesophageal reflux   . Gonorrhea   . Panic attack     Past Surgical History:  Procedure Laterality Date  . CHOLECYSTECTOMY N/A 11/24/2012   Procedure: LAPAROSCOPIC CHOLECYSTECTOMY WITH INTRAOPERATIVE CHOLANGIOGRAM;  Surgeon: Mariella SaaBenjamin T Hoxworth,  MD;  Location: [facility];  Service: General;  Laterality: N/A;  . CHOLECYSTECTOMY       Current Outpatient Prescriptions:  .  metroNIDAZOLE (METROGEL) 0.75 % vaginal gel, APPLY VAGINALLY FOR 5 DAYS (Patient not taking: Reported on 03/26/2016), Disp: 70 g, Rfl: 0 .  sertraline (ZOLOFT) 50 MG tablet, Take 1 tablet (50 mg total) by mouth daily. (Patient not taking: Reported on 03/26/2016), Disp: 30 tablet, Rfl: 5 No Known Allergies  Social History  Substance Use Topics  . Smoking status: Never Smoker  . Smokeless tobacco: Never Used  . Alcohol use No    Family History  Problem Relation Age of Onset  . Diabetes Maternal Grandmother   . Lupus Maternal Grandmother       Review of Systems  Constitutional: negative for fatigue and weight loss Respiratory: negative for cough and wheezing Cardiovascular: negative for chest pain, fatigue and palpitations Gastrointestinal: negative for abdominal pain and change in bowel habits Musculoskeletal:negative for myalgias Neurological: negative for gait problems and tremors Behavioral/Psych: negative for abusive relationship, depression Endocrine: negative for temperature intolerance    Genitourinary:negative for abnormal menstrual periods, genital lesions, hot flashes, sexual problems and vaginal discharge Integument/breast: negative for breast lump, breast tenderness, nipple discharge and skin lesion(s)    Objective:       BP (!) 127/92  Pulse 81   Temp 97.6 F (36.4 C) (Oral)   Wt 236 lb 6.4 oz (107.2 kg)   BMI 41.88 kg/Wood  General:   alert  Skin:   no rash or abnormalities  Lungs:   clear to auscultation bilaterally  Heart:   regular rate and rhythm, S1, S2 normal, no murmur, click, rub or gallop  Breasts:   normal without suspicious masses, skin or nipple changes or axillary nodes  Abdomen:  normal findings: no organomegaly, soft, non-tender and no hernia  Pelvis:  External genitalia: normal general appearance Urinary system: urethral  meatus normal and bladder without fullness, nontender Vaginal: normal without tenderness, induration or masses Cervix: normal appearance Adnexa: normal bimanual exam Uterus: anteverted and non-tender, normal size   Lab Review Urine pregnancy test Labs reviewed yes Radiologic studies reviewed no  50% of 20 min visit spent on counseling and coordination of care.    Assessment:    Healthy female exam.    Contraceptive counseling and advice.  Wants to continue Nexplanon.   Plan:    Education reviewed: low fat, low cholesterol diet, safe sex/STD prevention, self breast exams and weight bearing exercise. Contraception: Nexplanon. Follow up in: 1 year.   No orders of the defined types were placed in this encounter.  No orders of the defined types were placed in this encounter.     Patient ID: Annette Wood Kidney, female   DOB: Jul 17, 1990, 25 y.o.   MRN: [id number] Patient ID: Annette Wood Whitehorn, female   DOB: Jul 17, 1990, 25 y.o.   MRN: [id number] Patient ID: Annette Wood Ashe, female   DOB: Jul 17, 1990, 25 y.o.   MRN: [id number]

## 2016-03-27 ENCOUNTER — Other Ambulatory Visit: Payer: Self-pay | Admitting: Obstetrics

## 2016-03-27 DIAGNOSIS — N946 Dysmenorrhea, unspecified: Secondary | ICD-10-CM

## 2016-03-29 LAB — NUSWAB VG+, CANDIDA 6SP
Atopobium vaginae: HIGH Score — AB
BVAB 2: HIGH {score} — AB
CANDIDA ALBICANS, NAA: POSITIVE — AB
CANDIDA GLABRATA, NAA: NEGATIVE
CANDIDA TROPICALIS, NAA: NEGATIVE
Candida krusei, NAA: NEGATIVE
Candida lusitaniae, NAA: NEGATIVE
Candida parapsilosis, NAA: NEGATIVE
Chlamydia trachomatis, NAA: NEGATIVE
Neisseria gonorrhoeae, NAA: NEGATIVE
Trich vag by NAA: NEGATIVE

## 2016-03-30 ENCOUNTER — Other Ambulatory Visit: Payer: Self-pay | Admitting: Obstetrics

## 2016-03-30 DIAGNOSIS — B3731 Acute candidiasis of vulva and vagina: Secondary | ICD-10-CM

## 2016-03-30 DIAGNOSIS — B373 Candidiasis of vulva and vagina: Secondary | ICD-10-CM

## 2016-03-30 MED ORDER — FLUCONAZOLE 150 MG PO TABS: 150.0000 mg | ORAL_TABLET | Freq: Once | ORAL | 2 refills | Status: DC

## 2016-03-30 MED ORDER — METRONIDAZOLE 0.75 % VA GEL: Freq: Every day | VAGINAL | 0 refills | Status: AC

## 2016-04-17 ENCOUNTER — Ambulatory Visit (INDEPENDENT_AMBULATORY_CARE_PROVIDER_SITE_OTHER): Payer: Medicaid Other | Admitting: Obstetrics and Gynecology

## 2016-04-17 ENCOUNTER — Encounter: Payer: Self-pay | Admitting: Obstetrics and Gynecology

## 2016-04-17 VITALS — BP 108/78 | HR 89 | Temp 98.1°F | Ht 63.0 in | Wt 237.4 lb

## 2016-04-17 DIAGNOSIS — Z3202 Encounter for pregnancy test, result negative: Secondary | ICD-10-CM | POA: Diagnosis not present

## 2016-04-17 DIAGNOSIS — Z30433 Encounter for removal and reinsertion of intrauterine contraceptive device: Secondary | ICD-10-CM

## 2016-04-17 DIAGNOSIS — Z30017 Encounter for initial prescription of implantable subdermal contraceptive: Secondary | ICD-10-CM | POA: Diagnosis not present

## 2016-04-17 LAB — POCT URINE PREGNANCY: PREG TEST UR: NEGATIVE

## 2016-04-17 NOTE — Progress Notes (Signed)
Patient is in the office for nexplanon removal/re-insertion.  As noted. Nexplanon to expire this December.  Procedure:  Consent obtained  Left upper arm prepped in usual sterile fashion. Anesthized with 1% lidocaine  Nexplalnon palpated and noted to be deep in the SQ tissue Small stab incision was made. Hemostat was used to dissect the SQ tissue.  Several attempts were made to grasp the Nexplanon but were unsuccessful. Nexplanon noted to be at least 1 cm deep.  Dr C. Clearance CootsHarper was consulted. Noted Nexplanon to be deep as well.  Even with his assistance was unable to grasp and remove Nexplanon.  Procedure was then abandoned. Stri Strips were applied Wound care reviewed with pt  A/P Unsuccessful removal of Nexplanon  Discussed with pt that we would need to remove in the Or. Will discuss with radiology best imagine studies prior to procedure to help facilitate removal. Pt verbalized understand She will be contact with surgery date.

## 2016-05-22 ENCOUNTER — Other Ambulatory Visit: Payer: Self-pay | Admitting: *Deleted

## 2016-05-22 ENCOUNTER — Other Ambulatory Visit: Payer: Self-pay | Admitting: Obstetrics and Gynecology

## 2016-05-22 DIAGNOSIS — Z3046 Encounter for surveillance of implantable subdermal contraceptive: Secondary | ICD-10-CM

## 2016-05-22 NOTE — Progress Notes (Signed)
U/s ordered for placement of Nexplanon. [facility]

## 2016-05-31 ENCOUNTER — Telehealth: Payer: Self-pay | Admitting: Obstetrics and Gynecology

## 2016-06-03 ENCOUNTER — Ambulatory Visit (HOSPITAL_COMMUNITY): Payer: Medicaid Other

## 2016-06-04 ENCOUNTER — Ambulatory Visit (HOSPITAL_COMMUNITY): Payer: Medicaid Other

## 2016-06-05 ENCOUNTER — Ambulatory Visit: Payer: Self-pay | Admitting: Obstetrics and Gynecology

## 2016-06-10 ENCOUNTER — Ambulatory Visit: Payer: Self-pay | Admitting: Obstetrics and Gynecology

## 2016-06-11 ENCOUNTER — Encounter: Payer: Self-pay | Admitting: Certified Nurse Midwife

## 2016-06-11 ENCOUNTER — Other Ambulatory Visit (HOSPITAL_COMMUNITY)
Admission: RE | Admit: 2016-06-11 | Discharge: 2016-06-11 | Disposition: A | Discharge status: Home / Self Care | Payer: Medicaid Other | Source: Ambulatory Visit | Attending: Certified Nurse Midwife | Admitting: Certified Nurse Midwife

## 2016-06-11 ENCOUNTER — Ambulatory Visit (INDEPENDENT_AMBULATORY_CARE_PROVIDER_SITE_OTHER): Payer: Medicaid Other | Admitting: Certified Nurse Midwife

## 2016-06-11 VITALS — BP 126/88 | HR 78 | Temp 97.8°F | Wt 233.2 lb

## 2016-06-11 DIAGNOSIS — Z113 Encounter for screening for infections with a predominantly sexual mode of transmission: Secondary | ICD-10-CM | POA: Insufficient documentation

## 2016-06-11 NOTE — Progress Notes (Signed)
Patient ID: Annette Wood Fero, female   DOB: 05-Dec-1990, 26 y.o.   MRN: [id number]  Chief Complaint  Patient presents with  . other    STD screening    HPI Annette Wood Foot is a 26 y.o. female.  Desires STD screening, has had chronic BV.  Nexplanon removal attempted 04/17/16, unsuccessful.  Nexplanon palpated deep near tendon.  Approval pending Medicaid for general surgery referral for Nexplanon removal.  Is currently sexually active.  Declines blood STD testing.    HPI  Past Medical History:  Diagnosis Date  . Chlamydia   . Gallstones   . Gastroesophageal reflux   . Gonorrhea   . Panic attack     Past Surgical History:  Procedure Laterality Date  . CHOLECYSTECTOMY N/A 11/24/2012   Procedure: LAPAROSCOPIC CHOLECYSTECTOMY WITH INTRAOPERATIVE CHOLANGIOGRAM;  Surgeon: Mariella SaaBenjamin T Hoxworth, MD;  Location: [facility];  Service: General;  Laterality: N/A;  . CHOLECYSTECTOMY      Family History  Problem Relation Age of Onset  . Diabetes Maternal Grandmother   . Lupus Maternal Grandmother     Social History Social History  Substance Use Topics  . Smoking status: Never Smoker  . Smokeless tobacco: Never Used  . Alcohol use No    No Known Allergies  Current Outpatient Prescriptions  Medication Sig Dispense Refill  . ibuprofen (ADVIL,MOTRIN) 800 MG tablet TAKE 1 TABLET (800 MG TOTAL) BY MOUTH EVERY 8 (EIGHT) HOURS AS NEEDED. 30 tablet 1  . sertraline (ZOLOFT) 50 MG tablet Take 1 tablet (50 mg total) by mouth daily. (Patient not taking: Reported on 04/17/2016) 30 tablet 5   No current facility-administered medications for this visit.     Review of Systems Review of Systems Constitutional: negative for fatigue and weight loss Respiratory: negative for cough and wheezing Cardiovascular: negative for chest pain, fatigue and palpitations Gastrointestinal: negative for abdominal pain and change in bowel habits Genitourinary:negative Integument/breast: negative for nipple  discharge Musculoskeletal:negative for myalgias Neurological: negative for gait problems and tremors Behavioral/Psych: negative for abusive relationship, depression Endocrine: negative for temperature intolerance      Blood pressure 126/88, pulse 78, temperature 97.8 F (36.6 C), temperature source Oral, weight 233 lb 3.2 oz (105.8 kg).  Physical Exam Physical Exam General:   alert  Skin:   no rash or abnormalities  Lungs:   clear to auscultation bilaterally  Heart:   regular rate and rhythm, S1, S2 normal, no murmur, click, rub or gallop  Breasts:   deferred  Abdomen:  normal findings: no organomegaly, soft, non-tender and no hernia  Pelvis:  External genitalia: normal general appearance Urinary system: urethral meatus normal and bladder without fullness, nontender Vaginal: normal without tenderness, induration or masses     50% of 15 min visit spent on counseling and coordination of care.    Data Reviewed Previous medical hx, meds, labs  Assessment     STD screening.     Plan    No orders of the defined types were placed in this encounter.  No orders of the defined types were placed in this encounter.   Follow up as needed.

## 2016-06-12 LAB — CERVICOVAGINAL ANCILLARY ONLY
Bacterial vaginitis: POSITIVE — AB
CHLAMYDIA, DNA PROBE: NEGATIVE
Candida vaginitis: POSITIVE — AB
NEISSERIA GONORRHEA: NEGATIVE
TRICH (WINDOWPATH): NEGATIVE

## 2016-06-13 ENCOUNTER — Other Ambulatory Visit: Payer: Self-pay | Admitting: Certified Nurse Midwife

## 2016-06-13 ENCOUNTER — Ambulatory Visit (HOSPITAL_COMMUNITY): Admission: RE | Admit: 2016-06-13 | Payer: Medicaid Other | Source: Ambulatory Visit

## 2016-06-13 DIAGNOSIS — B9689 Other specified bacterial agents as the cause of diseases classified elsewhere: Secondary | ICD-10-CM

## 2016-06-13 DIAGNOSIS — N760 Acute vaginitis: Principal | ICD-10-CM

## 2016-06-13 DIAGNOSIS — B3731 Acute candidiasis of vulva and vagina: Secondary | ICD-10-CM

## 2016-06-13 DIAGNOSIS — B373 Candidiasis of vulva and vagina: Secondary | ICD-10-CM

## 2016-06-13 MED ORDER — TERCONAZOLE 0.8 % VA CREA: 1.0000 | TOPICAL_CREAM | Freq: Every day | VAGINAL | 0 refills | Status: DC

## 2016-06-13 MED ORDER — METRONIDAZOLE 500 MG PO TABS: 500.0000 mg | ORAL_TABLET | Freq: Two times a day (BID) | ORAL | 0 refills | Status: DC

## 2016-06-13 MED ORDER — FLUCONAZOLE 200 MG PO TABS: 200.0000 mg | ORAL_TABLET | Freq: Once | ORAL | 0 refills | Status: AC

## 2016-06-18 ENCOUNTER — Ambulatory Visit (HOSPITAL_COMMUNITY): Payer: Medicaid Other

## 2016-06-24 ENCOUNTER — Ambulatory Visit (HOSPITAL_COMMUNITY)
Admission: RE | Admit: 2016-06-24 | Discharge: 2016-06-24 | Disposition: A | Discharge status: Home / Self Care | Payer: Medicaid Other | Source: Ambulatory Visit | Attending: Obstetrics and Gynecology | Admitting: Obstetrics and Gynecology

## 2016-06-24 DIAGNOSIS — Z3046 Encounter for surveillance of implantable subdermal contraceptive: Secondary | ICD-10-CM | POA: Diagnosis present

## 2016-06-28 ENCOUNTER — Other Ambulatory Visit: Payer: Self-pay | Admitting: Obstetrics

## 2016-07-16 ENCOUNTER — Other Ambulatory Visit: Payer: Self-pay | Admitting: *Deleted

## 2016-07-16 ENCOUNTER — Encounter: Payer: Self-pay | Admitting: Obstetrics and Gynecology

## 2016-07-16 ENCOUNTER — Ambulatory Visit: Payer: Self-pay | Admitting: Obstetrics

## 2016-07-16 ENCOUNTER — Other Ambulatory Visit (HOSPITAL_COMMUNITY)
Admission: RE | Admit: 2016-07-16 | Discharge: 2016-07-16 | Disposition: A | Discharge status: Home / Self Care | Payer: Medicaid Other | Source: Ambulatory Visit | Attending: Obstetrics | Admitting: Obstetrics

## 2016-07-16 ENCOUNTER — Ambulatory Visit (INDEPENDENT_AMBULATORY_CARE_PROVIDER_SITE_OTHER): Payer: Medicaid Other | Admitting: Obstetrics and Gynecology

## 2016-07-16 VITALS — BP 131/87 | HR 89 | Wt 237.7 lb

## 2016-07-16 DIAGNOSIS — Z309 Encounter for contraceptive management, unspecified: Secondary | ICD-10-CM | POA: Insufficient documentation

## 2016-07-16 DIAGNOSIS — N898 Other specified noninflammatory disorders of vagina: Secondary | ICD-10-CM | POA: Insufficient documentation

## 2016-07-16 DIAGNOSIS — Z30011 Encounter for initial prescription of contraceptive pills: Secondary | ICD-10-CM | POA: Diagnosis not present

## 2016-07-16 DIAGNOSIS — Z975 Presence of (intrauterine) contraceptive device: Secondary | ICD-10-CM

## 2016-07-16 MED ORDER — DESOGESTREL-ETHINYL ESTRADIOL 0.15-30 MG-MCG PO TABS: 1.0000 | ORAL_TABLET | Freq: Every day | ORAL | 11 refills | Status: DC

## 2016-07-16 NOTE — Progress Notes (Addendum)
[facility]. She also thinks she has BV, she was treated in January but she is still having symptoms.    Pt here for follow after exam for location of Nexplanon, start contraception and vaginal discharge.   Attempted to remove Nexplanon in office last year, noted to be deep. X ray confirms @ 7.5 mm deep.  She desires to start OCP's. Has used in the past without problems. LMP 07/02/16 Sexual active occ use of condoms.  H/O BV treated in the past but thinks Sx have returned.   PE AF VSS Lungs clear Heart RRR Abd Soft  + BS GU white/yellowish discharge  A/P Will refer to general surgery for removal of Nexplanon. NuSwab collected today. Will contact pt with results and treat accordingly. OCP's R/B/U reviewed with pt. Back up method stressed. To start Sunday after next cycle. F/U in 3 months to access OCP's.

## 2016-07-16 NOTE — Patient Instructions (Signed)
Oral Contraception Use Oral contraceptive pills (OCPs) are medicines taken to prevent pregnancy. OCPs work by preventing the ovaries from releasing eggs. The hormones in OCPs also cause the cervical mucus to thicken, preventing the sperm from entering the uterus. The hormones also cause the uterine lining to become thin, not allowing a fertilized egg to attach to the inside of the uterus. OCPs are highly effective when taken exactly as prescribed. However, OCPs do not prevent sexually transmitted diseases (STDs). Safe sex practices, such as using condoms along with an OCP, can help prevent STDs. Before taking OCPs, you may have a physical exam and Pap test. Your health care provider may also order blood tests if necessary. Your health care provider will make sure you are a good candidate for oral contraception. Discuss with your health care provider the possible side effects of the OCP you may be prescribed. When starting an OCP, it can take 2 to 3 months for the body to adjust to the changes in hormone levels in your body. How to take oral contraceptive pills Your health care provider may advise you on how to start taking the first cycle of OCPs. Otherwise, you can:  Start on day 1 of your menstrual period. You will not need any backup contraceptive protection with this start time.  Start on the first Sunday after your menstrual period or the day you get your prescription. In these cases, you will need to use backup contraceptive protection for the first week.  Start the pill at any time of your cycle. If you take the pill within 5 days of the start of your period, you are protected against pregnancy right away. In this case, you will not need a backup form of birth control. If you start at any other time of your menstrual cycle, you will need to use another form of birth control for 7 days. If your OCP is the type called a minipill, it will protect you from pregnancy after taking it for 2 days (48  hours).  After you have started taking OCPs:  If you forget to take 1 pill, take it as soon as you remember. Take the next pill at the regular time.  If you miss 2 or more pills, call your health care provider because different pills have different instructions for missed doses. Use backup birth control until your next menstrual period starts.  If you use a 28-day pack that contains inactive pills and you miss 1 of the last 7 pills (pills with no hormones), it will not matter. Throw away the rest of the non-hormone pills and start a new pill pack.  No matter which day you start the OCP, you will always start a new pack on that same day of the week. Have an extra pack of OCPs and a backup contraceptive method available in case you miss some pills or lose your OCP pack. Follow these instructions at home:  Do not smoke.  Always use a condom to protect against STDs. OCPs do not protect against STDs.  Use a calendar to mark your menstrual period days.  Read the information and directions that came with your OCP. Talk to your health care provider if you have questions. Contact a health care provider if:  You develop nausea and vomiting.  You have abnormal vaginal discharge or bleeding.  You develop a rash.  You miss your menstrual period.  You are losing your hair.  You need treatment for mood swings or depression.  You   get dizzy when taking the OCP.  You develop acne from taking the OCP.  You become pregnant. Get help right away if:  You develop chest pain.  You develop shortness of breath.  You have an uncontrolled or severe headache.  You develop numbness or slurred speech.  You develop visual problems.  You develop pain, redness, and swelling in the legs. This information is not intended to replace advice given to you by your health care provider. Make sure you discuss any questions you have with your health care provider. Document Released: 04/25/2011 Document  Revised: 10/12/2015 Document Reviewed: 10/25/2012 Elsevier Interactive Patient Education  2017 Elsevier Inc.  

## 2016-07-17 LAB — CERVICOVAGINAL ANCILLARY ONLY
BACTERIAL VAGINITIS: POSITIVE — AB
CHLAMYDIA, DNA PROBE: NEGATIVE
Candida vaginitis: NEGATIVE
NEISSERIA GONORRHEA: NEGATIVE
Trichomonas: NEGATIVE

## 2016-07-19 ENCOUNTER — Telehealth: Payer: Self-pay

## 2016-07-19 DIAGNOSIS — B9689 Other specified bacterial agents as the cause of diseases classified elsewhere: Secondary | ICD-10-CM

## 2016-07-19 DIAGNOSIS — N760 Acute vaginitis: Principal | ICD-10-CM

## 2016-07-19 MED ORDER — METRONIDAZOLE 0.75 % VA GEL: 1.0000 | Freq: Every day | VAGINAL | 0 refills | Status: DC

## 2016-07-19 NOTE — Telephone Encounter (Signed)
Contacted patient and advised of results and rx.

## 2016-07-31 DIAGNOSIS — S40859A Superficial foreign body of unspecified upper arm, initial encounter: Secondary | ICD-10-CM | POA: Diagnosis not present

## 2016-08-15 ENCOUNTER — Telehealth: Payer: Self-pay | Admitting: *Deleted

## 2016-08-15 NOTE — Telephone Encounter (Signed)
Patient is concerned about her cycle- she is having cramping with no cycle. She had her Nexplanon removed on 3/14 and she stopped her OCP. She had unprotected intercourse after and she is concerned about pregnancy. Encouraged patient to do UPT- if positive she can confirm in the office. Cramping could possibly be her cycle trying to start. Encouraged patient to continue OCP for cycle control if not pregnant.

## 2016-08-20 ENCOUNTER — Telehealth: Payer: Self-pay | Admitting: *Deleted

## 2016-08-20 NOTE — Telephone Encounter (Signed)
Pt called to office with concerns about cycle.  Return call to pt. Pt states that she has not yet had cycle since Nexplanon removal. Pt states that she has had negative home pregnancy test.  Pt was advised that she could repeat test, abstain for 2 week and test again. If negative at both, she should be able to start OCP that was Rx. Pt was advised to use back up birth control. Pt advised that if she feels she needs appt at any time she may call and make appt. Pt states understanding.

## 2016-09-17 ENCOUNTER — Encounter: Payer: Self-pay | Admitting: Obstetrics and Gynecology

## 2016-09-17 ENCOUNTER — Ambulatory Visit (INDEPENDENT_AMBULATORY_CARE_PROVIDER_SITE_OTHER): Payer: Medicaid Other | Admitting: Obstetrics and Gynecology

## 2016-09-17 VITALS — BP 131/87 | HR 74 | Ht 63.0 in | Wt 229.0 lb

## 2016-09-17 DIAGNOSIS — Z30430 Encounter for insertion of intrauterine contraceptive device: Secondary | ICD-10-CM

## 2016-09-17 DIAGNOSIS — Z3202 Encounter for pregnancy test, result negative: Secondary | ICD-10-CM | POA: Diagnosis not present

## 2016-09-17 DIAGNOSIS — Z01812 Encounter for preprocedural laboratory examination: Secondary | ICD-10-CM | POA: Diagnosis not present

## 2016-09-17 LAB — POCT URINE PREGNANCY: PREG TEST UR: NEGATIVE

## 2016-09-17 MED ORDER — LEVONORGESTREL 19.5 MG IU IUD: 1.0000 | INTRAUTERINE_SYSTEM | Freq: Once | INTRAUTERINE | Status: DC

## 2016-09-17 NOTE — Progress Notes (Signed)
GYNECOLOGY CLINIC PROCEDURE NOTE  Annette Wood is a 26 y.o. [phone] here for [facility]  IUD insertion. No GYN concerns. UPT negative  IUD insertion  Patient identified, informed consent performed, consent signed.   Discussed risks of irregular bleeding, cramping, infection, malpositioning or misplacement of the IUD outside the uterus which may require further procedures. Also advised to use backup contraception for one week as the risk of pregnancy is higher during the transition period of removing an IUD and replacing it with another one. Time out was performed.  The cervix was cleaned with Betadine x 2 and grasped anteriorly with a single tooth tenaculum.  The [facility] IUD insertion apparatus was used to sound the uterus to 8 cm;  the IUD was then placed per manufacturer's recommendations. Strings trimmed to 3 cm. Tenaculum was removed, good hemostasis noted. Patient tolerated procedure well.   Patient was given post-procedure instructions.  She was reminded to have backup contraception for one week during this transition period between IUDs.  Patient was also asked to check IUD strings periodically and follow up in 4 weeks for IUD check.  [name], MD Attending Obstetrician & Gynecologist Center for [facility], [facility]

## 2016-09-17 NOTE — Patient Instructions (Signed)

## 2016-10-21 ENCOUNTER — Ambulatory Visit: Payer: Self-pay | Admitting: Obstetrics & Gynecology

## 2016-10-23 ENCOUNTER — Other Ambulatory Visit: Payer: Self-pay | Admitting: Certified Nurse Midwife

## 2016-10-23 DIAGNOSIS — B373 Candidiasis of vulva and vagina: Secondary | ICD-10-CM

## 2016-10-23 DIAGNOSIS — B3731 Acute candidiasis of vulva and vagina: Secondary | ICD-10-CM

## 2016-11-05 ENCOUNTER — Ambulatory Visit: Payer: Self-pay | Admitting: Obstetrics & Gynecology

## 2016-11-25 ENCOUNTER — Ambulatory Visit: Payer: Self-pay | Admitting: Obstetrics and Gynecology

## 2016-12-25 ENCOUNTER — Ambulatory Visit: Payer: Self-pay | Admitting: Obstetrics & Gynecology

## 2016-12-25 ENCOUNTER — Other Ambulatory Visit: Payer: Self-pay | Admitting: Obstetrics

## 2016-12-25 DIAGNOSIS — B9689 Other specified bacterial agents as the cause of diseases classified elsewhere: Secondary | ICD-10-CM

## 2016-12-25 DIAGNOSIS — N760 Acute vaginitis: Principal | ICD-10-CM

## 2017-01-23 ENCOUNTER — Ambulatory Visit: Payer: Self-pay | Admitting: Obstetrics and Gynecology

## 2017-02-12 ENCOUNTER — Other Ambulatory Visit (HOSPITAL_COMMUNITY)
Admission: RE | Admit: 2017-02-12 | Discharge: 2017-02-12 | Disposition: A | Discharge status: Home / Self Care | Payer: Medicaid Other | Source: Ambulatory Visit | Attending: Obstetrics & Gynecology | Admitting: Obstetrics & Gynecology

## 2017-02-12 ENCOUNTER — Ambulatory Visit (INDEPENDENT_AMBULATORY_CARE_PROVIDER_SITE_OTHER): Payer: Medicaid Other | Admitting: Obstetrics & Gynecology

## 2017-02-12 ENCOUNTER — Encounter: Payer: Self-pay | Admitting: Obstetrics & Gynecology

## 2017-02-12 VITALS — BP 143/85 | HR 73 | Ht 63.0 in | Wt 226.0 lb

## 2017-02-12 DIAGNOSIS — N871 Moderate cervical dysplasia: Secondary | ICD-10-CM | POA: Insufficient documentation

## 2017-02-12 DIAGNOSIS — Z0000 Encounter for general adult medical examination without abnormal findings: Secondary | ICD-10-CM

## 2017-02-12 DIAGNOSIS — R87610 Atypical squamous cells of undetermined significance on cytologic smear of cervix (ASC-US): Secondary | ICD-10-CM | POA: Diagnosis not present

## 2017-02-12 DIAGNOSIS — B9689 Other specified bacterial agents as the cause of diseases classified elsewhere: Secondary | ICD-10-CM | POA: Diagnosis not present

## 2017-02-12 DIAGNOSIS — Z01419 Encounter for gynecological examination (general) (routine) without abnormal findings: Secondary | ICD-10-CM | POA: Insufficient documentation

## 2017-02-12 DIAGNOSIS — N760 Acute vaginitis: Secondary | ICD-10-CM | POA: Insufficient documentation

## 2017-02-12 NOTE — Progress Notes (Signed)
Pt would like STD testing with today's visit. Pt needs referral for PCP.  Pt has questions about Nexplanon Removal scar.  Pt had to have it surgically removed.  Pt now has large scar and states that it is tender and sometimes painful.

## 2017-02-12 NOTE — Patient Instructions (Signed)

## 2017-02-12 NOTE — Progress Notes (Signed)
Patient ID: Annette Wood, female   DOB: 1990/07/11, 26 y.o.   MRN: [id number]  Chief Complaint  Patient presents with  . Gynecologic Exam    HPI Annette Wood is a 26 y.o. female.  Patient's last menstrual period was 02/06/2017. [id number] H/O cryotherapy forCIN 2, last pap ASCUS neg HR HPV. Kylena IUD in place HPI  Past Medical History:  Diagnosis Date  . Chlamydia   . Gallstones   . Gastroesophageal reflux   . Gonorrhea   . Panic attack     Past Surgical History:  Procedure Laterality Date  . CHOLECYSTECTOMY N/A 11/24/2012   Procedure: LAPAROSCOPIC CHOLECYSTECTOMY WITH INTRAOPERATIVE CHOLANGIOGRAM;  Surgeon: Mariella Saa, MD;  Location: [facility];  Service: General;  Laterality: N/A;  . CHOLECYSTECTOMY      Family History  Problem Relation Age of Onset  . Diabetes Maternal Grandmother   . Lupus Maternal Grandmother     Social History Social History  Substance Use Topics  . Smoking status: Never Smoker  . Smokeless tobacco: Never Used  . Alcohol use No    No Known Allergies  No current outpatient prescriptions on file.   Current Facility-Administered Medications  Medication Dose Route Frequency Provider Last Rate Last Dose  . Levonorgestrel IUD 1 Device  1 Device Intrauterine Once Hermina Staggers, MD        Review of Systems Review of Systems  Constitutional: Negative.   Respiratory: Negative.   Gastrointestinal: Negative.   Genitourinary: Negative for menstrual problem, pelvic pain, vaginal bleeding and vaginal discharge.  Musculoskeletal: Negative.     Blood pressure (!) 143/85, pulse 73, height  (1.6 m), weight 226 lb (102.5 kg), last menstrual period 02/06/2017.  Physical Exam Physical Exam  Constitutional: She is oriented to person, place, and time. She appears well-developed. No distress.  obese  Cardiovascular: Normal rate.   Pulmonary/Chest: Effort normal. No respiratory distress.  Abdominal: Soft. She exhibits no distension. There  is no tenderness.  Genitourinary: Vagina normal and uterus normal. No vaginal discharge found.  Genitourinary Comments: String at os, pap done no tenderness or mass  Neurological: She is alert and oriented to person, place, and time.  Psychiatric: She has a normal mood and affect. Her behavior is normal.  Breasts: breasts appear normal, no suspicious masses, no skin or nipple changes or axillary nodes.   Data Reviewed Pap results  Assessment    Well woman exan H/O CIN 2  Obesity, HTN  Plan    Needs PCP If pap is normal then routine f/u is indicated       Scheryl Darter 02/12/2017, 10:38 AM

## 2017-02-16 LAB — CYTOLOGY - PAP
ADEQUACY: ABSENT — AB
BACTERIAL VAGINITIS: POSITIVE — AB
Candida vaginitis: NEGATIVE
Chlamydia: NEGATIVE
DIAGNOSIS: UNDETERMINED — AB
HPV (WINDOPATH): NOT DETECTED
Neisseria Gonorrhea: NEGATIVE
Trichomonas: NEGATIVE

## 2017-02-19 ENCOUNTER — Other Ambulatory Visit: Payer: Self-pay

## 2017-02-19 MED ORDER — METRONIDAZOLE 500 MG PO TABS: 500.0000 mg | ORAL_TABLET | Freq: Two times a day (BID) | ORAL | 0 refills | Status: AC

## 2017-05-02 ENCOUNTER — Other Ambulatory Visit: Payer: Self-pay | Admitting: Obstetrics

## 2017-05-21 ENCOUNTER — Emergency Department (HOSPITAL_COMMUNITY)
Admission: EM | Admit: 2017-05-21 | Discharge: 2017-05-21 | Discharge status: Against Medical Advice | Payer: Medicaid Other

## 2017-05-21 NOTE — ED Notes (Signed)
Called for triage multiple times without response 

## 2017-05-21 NOTE — ED Notes (Signed)
Called pt for triage. No response.  

## 2017-05-28 ENCOUNTER — Ambulatory Visit: Payer: Self-pay | Admitting: Obstetrics and Gynecology

## 2017-06-04 ENCOUNTER — Ambulatory Visit: Payer: Self-pay | Admitting: Obstetrics & Gynecology

## 2017-06-09 ENCOUNTER — Other Ambulatory Visit: Payer: Self-pay | Admitting: Obstetrics

## 2017-06-09 DIAGNOSIS — B373 Candidiasis of vulva and vagina: Secondary | ICD-10-CM

## 2017-06-09 DIAGNOSIS — B3731 Acute candidiasis of vulva and vagina: Secondary | ICD-10-CM

## 2017-06-10 ENCOUNTER — Other Ambulatory Visit: Payer: Self-pay | Admitting: Obstetrics

## 2017-06-10 DIAGNOSIS — B373 Candidiasis of vulva and vagina: Secondary | ICD-10-CM

## 2017-06-10 DIAGNOSIS — B3731 Acute candidiasis of vulva and vagina: Secondary | ICD-10-CM

## 2017-06-10 MED ORDER — FLUCONAZOLE 150 MG PO TABS: 150.0000 mg | ORAL_TABLET | Freq: Once | ORAL | 0 refills | Status: AC

## 2017-07-02 ENCOUNTER — Ambulatory Visit: Payer: Self-pay | Admitting: Obstetrics & Gynecology

## 2017-08-05 ENCOUNTER — Other Ambulatory Visit (HOSPITAL_COMMUNITY)
Admission: RE | Admit: 2017-08-05 | Discharge: 2017-08-05 | Disposition: A | Discharge status: Home / Self Care | Payer: Medicaid Other | Source: Ambulatory Visit | Attending: Certified Nurse Midwife | Admitting: Certified Nurse Midwife

## 2017-08-05 ENCOUNTER — Ambulatory Visit: Payer: Medicaid Other | Admitting: Certified Nurse Midwife

## 2017-08-05 ENCOUNTER — Encounter: Payer: Self-pay | Admitting: Certified Nurse Midwife

## 2017-08-05 VITALS — BP 139/104 | HR 75 | Wt 228.0 lb

## 2017-08-05 DIAGNOSIS — B373 Candidiasis of vulva and vagina: Secondary | ICD-10-CM

## 2017-08-05 DIAGNOSIS — R87613 High grade squamous intraepithelial lesion on cytologic smear of cervix (HGSIL): Secondary | ICD-10-CM | POA: Diagnosis not present

## 2017-08-05 DIAGNOSIS — B9689 Other specified bacterial agents as the cause of diseases classified elsewhere: Secondary | ICD-10-CM | POA: Diagnosis not present

## 2017-08-05 DIAGNOSIS — Z23 Encounter for immunization: Secondary | ICD-10-CM | POA: Diagnosis not present

## 2017-08-05 DIAGNOSIS — Z3169 Encounter for other general counseling and advice on procreation: Secondary | ICD-10-CM | POA: Diagnosis not present

## 2017-08-05 DIAGNOSIS — Z9889 Other specified postprocedural states: Secondary | ICD-10-CM | POA: Insufficient documentation

## 2017-08-05 DIAGNOSIS — R030 Elevated blood-pressure reading, without diagnosis of hypertension: Secondary | ICD-10-CM

## 2017-08-05 DIAGNOSIS — N760 Acute vaginitis: Secondary | ICD-10-CM | POA: Diagnosis not present

## 2017-08-05 DIAGNOSIS — B3731 Acute candidiasis of vulva and vagina: Secondary | ICD-10-CM

## 2017-08-05 MED ORDER — FLUCONAZOLE 200 MG PO TABS: 200.0000 mg | ORAL_TABLET | Freq: Once | ORAL | 2 refills | Status: AC

## 2017-08-05 MED ORDER — TERCONAZOLE 0.8 % VA CREA
1.0000 | TOPICAL_CREAM | Freq: Every day | VAGINAL | 2 refills | Status: DC
Start: 2017-08-05 — End: 2017-08-25

## 2017-08-05 MED ORDER — METRONIDAZOLE 0.75 % VA GEL: 1.0000 | VAGINAL | 4 refills | Status: DC

## 2017-08-05 MED ORDER — VITAFOL-NANO 18-0.6-0.4 MG PO TABS: 1.0000 | ORAL_TABLET | Freq: Every day | ORAL | 12 refills | Status: DC

## 2017-08-05 MED ORDER — HYDROCHLOROTHIAZIDE 25 MG PO TABS
25.0000 mg | ORAL_TABLET | Freq: Every day | ORAL | 12 refills | Status: DC
Start: 2017-08-05 — End: 2017-08-25

## 2017-08-05 NOTE — Addendum Note (Signed)
Addended by: Orvilla CornwallENNEY, RACHELLE A on: 08/05/2017 03:55 PM   Modules accepted: Orders

## 2017-08-05 NOTE — Progress Notes (Signed)
Patient ID: Annette Wood, female   DOB: 09/06/1990, 27 y.o.   MRN: [id number]  Chief Complaint  Patient presents with  . Gynecologic Exam    iud removal today, no other BC- pt would like to concieve.    HPI Annette Karvonenichina M Rucci is a 27 y.o. female.  Patient originally here for IUD removal. Discussed Hypertension in detail and obesity on pregnancy.  20 lb weight loss and diet encouraged for high blood pressure prior to pregnancy.  Reports regular periods about 2-3 days, light bleeding with [name].  No other issues.  PCP referral placed for blood pressure management.  Started on HCTZ and PNV.  Repeat Pap smear today for history of abnormal pap smears.  Unsure of Gardasil series, also encouraged starting the series d/t hx of abnormal pap smears.  Last pap smear 02/12/17: ASCUS, absent transformation zone.  H/O cryotherapy forCIN 2, last pap ASCUS neg HR HPV. Kylena IUD in place   HPI  Past Medical History:  Diagnosis Date  . Chlamydia   . Gallstones   . Gastroesophageal reflux   . Gonorrhea   . Panic attack     Past Surgical History:  Procedure Laterality Date  . CHOLECYSTECTOMY N/A 11/24/2012   Procedure: LAPAROSCOPIC CHOLECYSTECTOMY WITH INTRAOPERATIVE CHOLANGIOGRAM;  Surgeon: Mariella SaaBenjamin T Hoxworth, MD;  Location: [facility];  Service: General;  Laterality: N/A;  . CHOLECYSTECTOMY      Family History  Problem Relation Age of Onset  . Diabetes Maternal Grandmother   . Lupus Maternal Grandmother     Social History Social History   Tobacco Use  . Smoking status: Never Smoker  . Smokeless tobacco: Never Used  Substance Use Topics  . Alcohol use: No    Alcohol/week: 0.0 oz  . Drug use: No    No Known Allergies  Current Outpatient Medications  Medication Sig Dispense Refill  . fluconazole (DIFLUCAN) 200 MG tablet Take 1 tablet (200 mg total) by mouth once for 1 dose. Repeat dose in 48-72 hours. 3 tablet 2  . hydrochlorothiazide (HYDRODIURIL) 25 MG tablet Take 1 tablet (25 mg total)  by mouth daily. 30 tablet 12  . ibuprofen (ADVIL,MOTRIN) 800 MG tablet TAKE 1 TABLET (800 MG TOTAL) BY MOUTH EVERY 8 (EIGHT) HOURS AS NEEDED. (Patient not taking: Reported on 08/05/2017) 30 tablet 5  . [START ON 08/07/2017] metroNIDAZOLE (METROGEL VAGINAL) 0.75 % vaginal gel Place 1 Applicatorful vaginally 2 (two) times a week. For 4-6 months. 70 g 4  . Prenatal-Fe Fum-Methf-FA w/o A (VITAFOL-NANO) 18-0.6-0.4 MG TABS Take 1 tablet by mouth daily. 30 tablet 12  . terconazole (TERAZOL 3) 0.8 % vaginal cream Place 1 applicator vaginally at bedtime. 20 g 2   Current Facility-Administered Medications  Medication Dose Route Frequency Provider Last Rate Last Dose  . Levonorgestrel IUD 1 Device  1 Device Intrauterine Once Hermina StaggersErvin, Michael L, MD        Review of Systems Review of Systems Constitutional: negative for fatigue and weight loss Respiratory: negative for cough and wheezing Cardiovascular: negative for chest pain, fatigue and palpitations Gastrointestinal: negative for abdominal pain and change in bowel habits Genitourinary:negative Integument/breast: negative for nipple discharge Musculoskeletal:negative for myalgias Neurological: negative for gait problems and tremors Behavioral/Psych: negative for abusive relationship, depression Endocrine: negative for temperature intolerance      Blood pressure (!) 139/104, pulse 75, weight 228 lb (103.4 kg).  Physical Exam Physical Exam General:   alert  Skin:   no rash or abnormalities  Lungs:   clear  to auscultation bilaterally  Heart:   regular rate and rhythm, S1, S2 normal, no murmur, click, rub or gallop  Breasts:   normal without suspicious masses, skin or nipple changes or axillary nodes  Abdomen:  normal findings: no organomegaly, soft, non-tender and no hernia  Pelvis:  External genitalia: normal general appearance Urinary system: urethral meatus normal and bladder without fullness, nontender Vaginal: normal without tenderness,  induration or masses Cervix: normal appearance, IUD strings present Adnexa: normal bimanual exam Uterus: anteverted and non-tender, normal size    50% of 30 min visit spent on counseling and coordination of care.   Data Reviewed Previous medical hx, meds, labs  Assessment     Chronic BV STD screening exam d/t history Preconception counseling  1. Elevated blood pressure reading    - Ambulatory referral to Internal Medicine - hydrochlorothiazide (HYDRODIURIL) 25 MG tablet; Take 1 tablet (25 mg total) by mouth daily.  Dispense: 30 tablet; Refill: 12  2. High grade squamous intraepithelial lesion (HGSIL) on cytologic smear of cervix     - Cytology - PAP  3. History of colposcopy with cervical biopsy     - Cytology - PAP  4. Pre-conception counseling      Wait six months for control of HTN, weight loss, Gardasil series and tx for Chronic BV.  - Prenatal-Fe Fum-Methf-FA w/o A (VITAFOL-NANO) 18-0.6-0.4 MG TABS; Take 1 tablet by mouth daily.  Dispense: 30 tablet; Refill: 12  5. BV (bacterial vaginosis)     - metroNIDAZOLE (METROGEL VAGINAL) 0.75 % vaginal gel; Place 1 Applicatorful vaginally 2 (two) times a week. For 4-6 months.  Dispense: 70 g; Refill: 4  6. Vaginal yeast infection      - fluconazole (DIFLUCAN) 200 MG tablet; Take 1 tablet (200 mg total) by mouth once for 1 dose. Repeat dose in 48-72 hours.  Dispense: 3 tablet; Refill: 2 - terconazole (TERAZOL 3) 0.8 % vaginal cream; Place 1 applicator vaginally at bedtime.  Dispense: 20 g; Refill: 2  7. Encounter for immunization    - HPV 9-valent vaccine,Recombinat     Plan    Orders Placed This Encounter  Procedures  . HPV 9-valent vaccine,Recombinat  . Ambulatory referral to Internal Medicine    Referral Priority:   Routine    Referral Type:   Consultation    Referral Reason:   Specialty Services Required    Requested Specialty:   Internal Medicine    Number of Visits Requested:   1   Meds ordered this  encounter  Medications  . hydrochlorothiazide (HYDRODIURIL) 25 MG tablet    Sig: Take 1 tablet (25 mg total) by mouth daily.    Dispense:  30 tablet    Refill:  12  . Prenatal-Fe Fum-Methf-FA w/o A (VITAFOL-NANO) 18-0.6-0.4 MG TABS    Sig: Take 1 tablet by mouth daily.    Dispense:  30 tablet    Refill:  12  . metroNIDAZOLE (METROGEL VAGINAL) 0.75 % vaginal gel    Sig: Place 1 Applicatorful vaginally 2 (two) times a week. For 4-6 months.    Dispense:  70 g    Refill:  4  . fluconazole (DIFLUCAN) 200 MG tablet    Sig: Take 1 tablet (200 mg total) by mouth once for 1 dose. Repeat dose in 48-72 hours.    Dispense:  3 tablet    Refill:  2  . terconazole (TERAZOL 3) 0.8 % vaginal cream    Sig: Place 1 applicator vaginally at bedtime.  Dispense:  20 g    Refill:  2    Follow up 6 months.

## 2017-08-06 LAB — CERVICOVAGINAL ANCILLARY ONLY
Bacterial vaginitis: POSITIVE — AB
CHLAMYDIA, DNA PROBE: NEGATIVE
Candida vaginitis: NEGATIVE
NEISSERIA GONORRHEA: NEGATIVE
Trichomonas: NEGATIVE

## 2017-08-06 NOTE — Addendum Note (Signed)
Addended by: Hamilton CapriBURCH, Khrystal Jeanmarie J on: 08/06/2017 08:23 AM   Modules accepted: Orders

## 2017-08-07 ENCOUNTER — Other Ambulatory Visit: Payer: Self-pay | Admitting: Certified Nurse Midwife

## 2017-08-08 LAB — CYTOLOGY - PAP
DIAGNOSIS: UNDETERMINED — AB
HPV (WINDOPATH): DETECTED — AB
HPV 16/18/45 GENOTYPING: NEGATIVE

## 2017-08-14 ENCOUNTER — Telehealth: Payer: Self-pay

## 2017-08-14 NOTE — Telephone Encounter (Signed)
Returned call and answered questions about rx that was sent.

## 2017-08-18 ENCOUNTER — Other Ambulatory Visit: Payer: Self-pay | Admitting: Certified Nurse Midwife

## 2017-08-18 DIAGNOSIS — R87610 Atypical squamous cells of undetermined significance on cytologic smear of cervix (ASC-US): Secondary | ICD-10-CM | POA: Insufficient documentation

## 2017-08-20 ENCOUNTER — Ambulatory Visit: Payer: Medicaid Other

## 2017-08-20 DIAGNOSIS — Z3042 Encounter for surveillance of injectable contraceptive: Secondary | ICD-10-CM

## 2017-08-20 NOTE — Progress Notes (Signed)
Pt presented for  2nd Gardasil Injection.  Last Injection was 08/05/17  Next injection Due 10/05/17  Pt made aware voiced understanding.   Pt given Gardasil Handout w/schedule.  Per notes pt to F/U in 6 months.   B/P  Today: 130/91 P: 80

## 2017-08-20 NOTE — Progress Notes (Signed)
I have reviewed the chart and agree with nursing staff's documentation of this patient's encounter.  Roe CoombsRachelle A Alica Shellhammer, CNM 08/20/2017 1:49 PM

## 2017-08-25 ENCOUNTER — Encounter: Payer: Self-pay | Admitting: Certified Nurse Midwife

## 2017-08-25 ENCOUNTER — Ambulatory Visit (INDEPENDENT_AMBULATORY_CARE_PROVIDER_SITE_OTHER): Payer: Medicaid Other | Admitting: Certified Nurse Midwife

## 2017-08-25 VITALS — BP 121/87 | HR 85 | Wt 224.0 lb

## 2017-08-25 DIAGNOSIS — Z30432 Encounter for removal of intrauterine contraceptive device: Secondary | ICD-10-CM | POA: Diagnosis not present

## 2017-08-25 DIAGNOSIS — Z3049 Encounter for surveillance of other contraceptives: Secondary | ICD-10-CM

## 2017-08-25 DIAGNOSIS — Z8679 Personal history of other diseases of the circulatory system: Secondary | ICD-10-CM

## 2017-08-25 MED ORDER — LABETALOL HCL 100 MG PO TABS: 100.0000 mg | ORAL_TABLET | Freq: Two times a day (BID) | ORAL | 5 refills | Status: DC

## 2017-08-25 NOTE — Progress Notes (Signed)
    GYNECOLOGY OFFICE PROCEDURE NOTE  Annette Wood is a 27 y.o. [phone] [facility] No GYN concerns.  Last pap smear was on 07/25/15 and was ASCUS.  IUD Removal  Patient identified, informed consent performed, consent signed.  Patient was in the dorsal lithotomy position, normal external genitalia was noted.  A speculum was placed in the patient's vagina, normal discharge was noted, no lesions. The cervix was visualized, no lesions, no abnormal discharge.  The strings of the IUD were grasped and pulled using ring forceps. The IUD was removed in its entirety. Patient tolerated the procedure well.    Patient will use nothing for contraception and plans for pregnancy soon and she was told to avoid teratogens, take PNV and folic acid.  Routine preventative health maintenance measures emphasized.  Encouraged to get PCP management of CHTN, medication changed from HCTZ to Labetalol.     Orvilla Cornwallachelle Katha Kuehne, CNM Center for [facility], [facility]

## 2017-08-27 ENCOUNTER — Ambulatory Visit: Payer: Self-pay

## 2017-09-17 ENCOUNTER — Ambulatory Visit (INDEPENDENT_AMBULATORY_CARE_PROVIDER_SITE_OTHER): Payer: Medicaid Other | Admitting: Certified Nurse Midwife

## 2017-09-17 VITALS — BP 122/91 | HR 73 | Wt 224.0 lb

## 2017-09-17 DIAGNOSIS — I10 Essential (primary) hypertension: Secondary | ICD-10-CM

## 2017-09-17 DIAGNOSIS — Z3169 Encounter for other general counseling and advice on procreation: Secondary | ICD-10-CM | POA: Diagnosis not present

## 2017-09-17 DIAGNOSIS — Z8679 Personal history of other diseases of the circulatory system: Secondary | ICD-10-CM

## 2017-09-18 ENCOUNTER — Encounter: Payer: Self-pay | Admitting: Certified Nurse Midwife

## 2017-09-18 DIAGNOSIS — I10 Essential (primary) hypertension: Secondary | ICD-10-CM | POA: Insufficient documentation

## 2017-09-18 MED ORDER — LABETALOL HCL 100 MG PO TABS: 100.0000 mg | ORAL_TABLET | Freq: Two times a day (BID) | ORAL | 5 refills | Status: DC

## 2017-09-18 MED ORDER — VITAFOL-NANO 18-0.6-0.4 MG PO TABS: 1.0000 | ORAL_TABLET | Freq: Every day | ORAL | 12 refills | Status: DC

## 2017-09-18 NOTE — Progress Notes (Signed)
Patient ID: Annette Wood, female   DOB: 30-Nov-1990, 27 y.o.   MRN: [id number]  Chief Complaint  Patient presents with  . Gynecologic Exam    HPI Annette Wood is a 27 y.o. female.  Here for change in her period.  States that she started with brown spotting a few days ago that changed to light pink bleeding.  Had [place] IUD removed on 08/25/17. Had a few days of bleeding and then stopped.  Unprotected sexual intercourse about 5 days ago.  Not sure if this is a regular period or implantation bleeding.  Discussed that it would be too soon to test for pregnancy, encouraged to wait a few weeks and do another UPT at home around the 15th of May, unless her bleeding continues to be more period like.  Discussed if normal period would ovulate around the 15th of May.  Patient verbalized understanding.     HPI  Past Medical History:  Diagnosis Date  . Chlamydia   . Gallstones   . Gastroesophageal reflux   . Gonorrhea   . Panic attack     Past Surgical History:  Procedure Laterality Date  . CHOLECYSTECTOMY N/A 11/24/2012   Procedure: LAPAROSCOPIC CHOLECYSTECTOMY WITH INTRAOPERATIVE CHOLANGIOGRAM;  Surgeon: Mariella Saa, MD;  Location: [facility];  Service: General;  Laterality: N/A;  . CHOLECYSTECTOMY      Family History  Problem Relation Age of Onset  . Diabetes Maternal Grandmother   . Lupus Maternal Grandmother     Social History Social History   Tobacco Use  . Smoking status: Never Smoker  . Smokeless tobacco: Never Used  Substance Use Topics  . Alcohol use: No    Alcohol/week: 0.0 oz  . Drug use: No    No Known Allergies  Current Outpatient Medications  Medication Sig Dispense Refill  . ibuprofen (ADVIL,MOTRIN) 800 MG tablet TAKE 1 TABLET (800 MG TOTAL) BY MOUTH EVERY 8 (EIGHT) HOURS AS NEEDED. (Patient not taking: Reported on 08/05/2017) 30 tablet 5  . labetalol (NORMODYNE) 100 MG tablet Take 1 tablet (100 mg total) by mouth 2 (two) times daily. (Patient not taking:  Reported on 09/17/2017) 60 tablet 5  . Prenatal-Fe Fum-Methf-FA w/o A (VITAFOL-NANO) 18-0.6-0.4 MG TABS Take 1 tablet by mouth daily. (Patient not taking: Reported on 09/17/2017) 30 tablet 12   Current Facility-Administered Medications  Medication Dose Route Frequency Provider Last Rate Last Dose  . Levonorgestrel IUD 1 Device  1 Device Intrauterine Once Hermina Staggers, MD        Review of Systems Review of Systems Constitutional: negative for fatigue and weight loss Respiratory: negative for cough and wheezing Cardiovascular: negative for chest pain, fatigue and palpitations Gastrointestinal: negative for abdominal pain and change in bowel habits Genitourinary:negative Integument/breast: negative for nipple discharge Musculoskeletal:negative for myalgias Neurological: negative for gait problems and tremors Behavioral/Psych: negative for abusive relationship, depression Endocrine: negative for temperature intolerance      Blood pressure (!) 122/91, pulse 73, weight 224 lb (101.6 kg), last menstrual period 09/17/2017.  Physical Exam Physical Exam General:   alert  Skin:   no rash or abnormalities  Lungs:   clear to auscultation bilaterally  Heart:   regular rate and rhythm, S1, S2 normal, no murmur, click, rub or gallop  Breasts:   deferrred  Abdomen:  normal findings: no organomegaly, soft, non-tender and no hernia  Pelvis:  deferred      UPT: Negative  50% of 15 min visit spent on counseling and coordination of  care.   Data Reviewed Previous medical hx, meds, labs  Assessment     Negative UPT ?period bleeding versus implantation bleeding Preconception counseling Hx of CHTN: not currently taking Labetalol.     Plan     Follow up as needed for pregnancy, continue PNV. Continue Labetalol for CHTN.

## 2017-09-26 ENCOUNTER — Encounter: Payer: Self-pay | Admitting: *Deleted

## 2017-09-26 ENCOUNTER — Ambulatory Visit (INDEPENDENT_AMBULATORY_CARE_PROVIDER_SITE_OTHER): Payer: Medicaid Other | Admitting: *Deleted

## 2017-09-26 VITALS — BP 142/92 | HR 67 | Wt 226.0 lb

## 2017-09-26 DIAGNOSIS — Z3202 Encounter for pregnancy test, result negative: Secondary | ICD-10-CM | POA: Diagnosis not present

## 2017-09-26 DIAGNOSIS — Z23 Encounter for immunization: Secondary | ICD-10-CM

## 2017-09-26 DIAGNOSIS — N926 Irregular menstruation, unspecified: Secondary | ICD-10-CM

## 2017-09-26 LAB — POCT URINE PREGNANCY: PREG TEST UR: NEGATIVE

## 2017-09-26 NOTE — Progress Notes (Signed)
Pt is in office for 2nd Gardasil injection.  Pt tolerated injection well.  Pt advised to RTO in September for last Gardasil.  Pt request UPT in office today. UPT in office is negative.  Pt advised to track cycles as she is trying to conceive.  BP (!) 142/92   Pulse 67   Wt 226 lb (102.5 kg)   LMP 09/17/2017   BMI 40.03 kg/m

## 2017-09-29 NOTE — Progress Notes (Signed)
I have reviewed the chart and agree with nursing staff's documentation of this patient's encounter.  Roe Coombs, CNM 09/29/2017 9:02 AM

## 2017-12-12 ENCOUNTER — Ambulatory Visit: Payer: Self-pay | Admitting: Obstetrics

## 2018-01-05 ENCOUNTER — Ambulatory Visit: Payer: Self-pay | Admitting: Obstetrics

## 2018-01-27 ENCOUNTER — Ambulatory Visit: Payer: Medicaid Other

## 2018-01-28 ENCOUNTER — Ambulatory Visit: Payer: Medicaid Other

## 2018-01-28 ENCOUNTER — Telehealth: Payer: Self-pay

## 2018-01-28 NOTE — Telephone Encounter (Signed)
Entered in error

## 2018-02-10 ENCOUNTER — Emergency Department (HOSPITAL_COMMUNITY)
Admission: EM | Admit: 2018-02-10 | Discharge: 2018-02-10 | Discharge status: Absent without leave | Payer: Medicaid Other

## 2018-02-16 ENCOUNTER — Other Ambulatory Visit (HOSPITAL_COMMUNITY)
Admission: RE | Admit: 2018-02-16 | Discharge: 2018-02-16 | Disposition: A | Discharge status: Home / Self Care | Payer: Medicaid Other | Source: Ambulatory Visit | Attending: Obstetrics and Gynecology | Admitting: Obstetrics and Gynecology

## 2018-02-16 ENCOUNTER — Ambulatory Visit (INDEPENDENT_AMBULATORY_CARE_PROVIDER_SITE_OTHER): Payer: Medicaid Other

## 2018-02-16 VITALS — BP 140/90 | HR 90 | Wt 213.9 lb

## 2018-02-16 DIAGNOSIS — Z202 Contact with and (suspected) exposure to infections with a predominantly sexual mode of transmission: Secondary | ICD-10-CM

## 2018-02-16 DIAGNOSIS — Z23 Encounter for immunization: Secondary | ICD-10-CM | POA: Diagnosis not present

## 2018-02-16 DIAGNOSIS — Z3202 Encounter for pregnancy test, result negative: Secondary | ICD-10-CM | POA: Diagnosis not present

## 2018-02-16 LAB — POCT URINE PREGNANCY: PREG TEST UR: NEGATIVE

## 2018-02-16 NOTE — Progress Notes (Signed)
Nurse visit for all STD testing and final Gardasil injection. Pt also requests UPT today. BP elevated, pt is asymptomatic; pt admits to not taking BP meds today. Pt encouraged to continue BP meds and f/u with PCP.

## 2018-02-16 NOTE — Progress Notes (Signed)
Patient not seen by MD today.  Nurse visit for Gardasil

## 2018-02-17 ENCOUNTER — Telehealth: Payer: Self-pay | Admitting: *Deleted

## 2018-02-17 LAB — HEPATITIS C ANTIBODY: Hep C Virus Ab: <0.1 s/co ratio (ref 0.0–0.9)

## 2018-02-17 LAB — RPR: RPR Ser Ql: NONREACTIVE

## 2018-02-17 LAB — HIV ANTIBODY (ROUTINE TESTING W REFLEX): HIV SCREEN 4TH GENERATION: NONREACTIVE

## 2018-02-17 LAB — CERVICOVAGINAL ANCILLARY ONLY
CHLAMYDIA, DNA PROBE: NEGATIVE
NEISSERIA GONORRHEA: NEGATIVE
TRICH (WINDOWPATH): NEGATIVE

## 2018-02-17 LAB — HEPATITIS B SURFACE ANTIGEN: Hepatitis B Surface Ag: NEGATIVE

## 2018-02-17 NOTE — Progress Notes (Signed)
I have reviewed the chart and agree with nursing staff's documentation of this patient's encounter.  Catalina Antigua, MD 02/17/2018 10:43 AM

## 2018-02-17 NOTE — Telephone Encounter (Signed)
Pt called office for lab results.  Return call to pt, no answer. LM on VM,per DPR, making pt aware that labs are normal but vaginal swab has not yet resulted. Advised that she may call back later this week to verify results.

## 2018-05-04 ENCOUNTER — Ambulatory Visit: Payer: Medicaid Other | Admitting: Obstetrics

## 2018-06-25 ENCOUNTER — Ambulatory Visit: Payer: Medicaid Other | Admitting: Obstetrics

## 2018-07-03 ENCOUNTER — Ambulatory Visit: Payer: Medicaid Other | Admitting: Obstetrics

## 2018-07-06 ENCOUNTER — Telehealth: Payer: Self-pay | Admitting: Obstetrics

## 2018-07-06 ENCOUNTER — Encounter: Payer: Self-pay | Admitting: Obstetrics

## 2018-07-06 NOTE — Telephone Encounter (Signed)
Unable to LVM, no answer  Mailed letter to call office to reschedule appointment.

## 2018-08-11 ENCOUNTER — Ambulatory Visit: Payer: Medicaid Other | Admitting: Obstetrics

## 2018-09-21 ENCOUNTER — Ambulatory Visit: Payer: Medicaid Other | Admitting: Obstetrics

## 2018-11-12 ENCOUNTER — Other Ambulatory Visit: Payer: Self-pay | Admitting: Obstetrics

## 2018-11-12 DIAGNOSIS — B9689 Other specified bacterial agents as the cause of diseases classified elsewhere: Secondary | ICD-10-CM

## 2018-11-17 ENCOUNTER — Encounter (HOSPITAL_COMMUNITY): Payer: Self-pay | Admitting: Emergency Medicine

## 2018-11-17 ENCOUNTER — Emergency Department (HOSPITAL_COMMUNITY): Payer: Medicaid Other

## 2018-11-17 ENCOUNTER — Other Ambulatory Visit: Payer: Self-pay

## 2018-11-17 ENCOUNTER — Emergency Department (HOSPITAL_COMMUNITY)
Admission: EM | Admit: 2018-11-17 | Discharge: 2018-11-17 | Disposition: A | Discharge status: Home / Self Care | Payer: Medicaid Other | Attending: Emergency Medicine | Admitting: Emergency Medicine

## 2018-11-17 DIAGNOSIS — I10 Essential (primary) hypertension: Secondary | ICD-10-CM | POA: Insufficient documentation

## 2018-11-17 DIAGNOSIS — Y939 Activity, unspecified: Secondary | ICD-10-CM | POA: Insufficient documentation

## 2018-11-17 DIAGNOSIS — S39012A Strain of muscle, fascia and tendon of lower back, initial encounter: Secondary | ICD-10-CM | POA: Insufficient documentation

## 2018-11-17 DIAGNOSIS — Y929 Unspecified place or not applicable: Secondary | ICD-10-CM | POA: Diagnosis not present

## 2018-11-17 DIAGNOSIS — S3992XA Unspecified injury of lower back, initial encounter: Secondary | ICD-10-CM | POA: Diagnosis not present

## 2018-11-17 DIAGNOSIS — M545 Low back pain: Secondary | ICD-10-CM | POA: Diagnosis not present

## 2018-11-17 DIAGNOSIS — M546 Pain in thoracic spine: Secondary | ICD-10-CM | POA: Insufficient documentation

## 2018-11-17 DIAGNOSIS — Y999 Unspecified external cause status: Secondary | ICD-10-CM | POA: Insufficient documentation

## 2018-11-17 DIAGNOSIS — S299XXA Unspecified injury of thorax, initial encounter: Secondary | ICD-10-CM | POA: Diagnosis not present

## 2018-11-17 LAB — POC URINE PREG, ED: Preg Test, Ur: NEGATIVE

## 2018-11-17 MED ORDER — NAPROXEN 500 MG PO TABS: 500.0000 mg | ORAL_TABLET | Freq: Two times a day (BID) | ORAL | 0 refills | Status: DC | PRN

## 2018-11-17 MED ORDER — METHOCARBAMOL 500 MG PO TABS: 500.0000 mg | ORAL_TABLET | Freq: Two times a day (BID) | ORAL | 0 refills | Status: DC | PRN

## 2018-11-17 MED ORDER — NAPROXEN 250 MG PO TABS
500.0000 mg | ORAL_TABLET | Freq: Once | ORAL | Status: AC
  Administered 2018-11-17: 500 mg via ORAL
  Filled 2018-11-17: qty 2

## 2018-11-17 MED ORDER — METHOCARBAMOL 500 MG PO TABS
500.0000 mg | ORAL_TABLET | Freq: Once | ORAL | Status: AC
  Administered 2018-11-17: 11:00:00 500 mg via ORAL
  Filled 2018-11-17: qty 1
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
## 2018-11-17 NOTE — ED Notes (Signed)
Patient verbalizes understanding of discharge instructions. Opportunity for questioning and answers were provided. Armband removed by staff, pt discharged from ED.  
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
## 2019-01-05 ENCOUNTER — Emergency Department (HOSPITAL_COMMUNITY)
Admission: EM | Admit: 2019-01-05 | Discharge: 2019-01-05 | Disposition: A | Discharge status: Home / Self Care | Payer: Medicaid Other | Attending: Emergency Medicine | Admitting: Emergency Medicine

## 2019-01-05 ENCOUNTER — Encounter (HOSPITAL_COMMUNITY): Payer: Self-pay | Admitting: Emergency Medicine

## 2019-01-05 DIAGNOSIS — I10 Essential (primary) hypertension: Secondary | ICD-10-CM | POA: Diagnosis not present

## 2019-01-05 DIAGNOSIS — Z20828 Contact with and (suspected) exposure to other viral communicable diseases: Secondary | ICD-10-CM | POA: Insufficient documentation

## 2019-01-05 DIAGNOSIS — Z03818 Encounter for observation for suspected exposure to other biological agents ruled out: Secondary | ICD-10-CM | POA: Diagnosis not present

## 2019-01-05 DIAGNOSIS — Z79899 Other long term (current) drug therapy: Secondary | ICD-10-CM | POA: Insufficient documentation

## 2019-01-05 DIAGNOSIS — Z202 Contact with and (suspected) exposure to infections with a predominantly sexual mode of transmission: Secondary | ICD-10-CM | POA: Insufficient documentation

## 2019-01-05 LAB — URINALYSIS, ROUTINE W REFLEX MICROSCOPIC
Bilirubin Urine: NEGATIVE
Glucose, UA: NEGATIVE mg/dL
Hgb urine dipstick: NEGATIVE
Ketones, ur: NEGATIVE mg/dL
Leukocytes,Ua: NEGATIVE
Nitrite: NEGATIVE
Protein, ur: NEGATIVE mg/dL
Specific Gravity, Urine: 1.019 (ref 1.005–1.030)
pH: 7 (ref 5.0–8.0)

## 2019-01-05 LAB — SARS CORONAVIRUS 2 (TAT 6-24 HRS): SARS Coronavirus 2: NEGATIVE

## 2019-01-05 MED ORDER — CEFTRIAXONE SODIUM 250 MG IJ SOLR
250.0000 mg | Freq: Once | INTRAMUSCULAR | Status: AC
  Administered 2019-01-05: 250 mg via INTRAMUSCULAR
  Filled 2019-01-05: qty 250

## 2019-01-05 MED ORDER — AZITHROMYCIN 250 MG PO TABS
1000.0000 mg | ORAL_TABLET | Freq: Once | ORAL | Status: AC
  Administered 2019-01-05: 18:00:00 1000 mg via ORAL
  Filled 2019-01-05: qty 4

## 2019-01-05 NOTE — ED Provider Notes (Signed)
[facility] Provider Note   CSN: [id number] Arrival date & time: 01/05/19  1450    History   Chief Complaint Chief Complaint  Patient presents with  . Exposure to STD    HPI Annette Wood is a 28 y.o. female.     28 y.o female with no pertinent PMH presents to the ED requesting STD screening. Patient reports she has  Been messing with this guy for 3 months, and his girlfriend just tested positive for gonorrhea and chlamydia". She reports no abdominal pain, vaginal discharge, vaginal bleeding or odor but states "she just wants a checkup". No fevers.   The history is provided by the patient.  Exposure to STD Pertinent negatives include no abdominal pain, no headaches and no shortness of breath.    Past Medical History:  Diagnosis Date  . Chlamydia   . Gallstones   . Gastroesophageal reflux   . Gonorrhea   . Panic attack     Patient Active Problem List   Diagnosis Date Noted  . Hypertension 09/18/2017  . ASCUS of cervix with negative high risk HPV 08/18/2017  . Unspecified vitamin D deficiency 08/27/2012    Past Surgical History:  Procedure Laterality Date  . CHOLECYSTECTOMY N/A 11/24/2012   Procedure: LAPAROSCOPIC CHOLECYSTECTOMY WITH INTRAOPERATIVE CHOLANGIOGRAM;  Surgeon: Mariella SaaBenjamin T Hoxworth, MD;  Location: [facility];  Service: General;  Laterality: N/A;  . CHOLECYSTECTOMY       OB History    Gravida  3   Para  3   Term  3   Preterm      AB      Living  3     SAB      TAB      Ectopic      Multiple      Live Births  3            Home Medications    Prior to Admission medications   Medication Sig Start Date End Date Taking? Authorizing Provider  labetalol (NORMODYNE) 100 MG tablet Take 1 tablet (100 mg total) by mouth 2 (two) times daily. Patient not taking: Reported on 02/16/2018 09/18/17   Orvilla Cornwallenney, Rachelle A, CNM  methocarbamol (ROBAXIN) 500 MG tablet Take 1 tablet (500 mg total) by mouth 2 (two)  times daily as needed (back pain). 11/17/18   Ward, Chase PicketJaime Pilcher, PA-C  metroNIDAZOLE (METROGEL) 0.75 % vaginal gel PLACE 1 APPLICATORFUL VAGINALLY 2 (TWO) TIMES A WEEK. FOR 4-6 MONTHS. 11/16/18   Brock BadHarper, Charles A, MD  naproxen (NAPROSYN) 500 MG tablet Take 1 tablet (500 mg total) by mouth 2 (two) times daily as needed for mild pain or moderate pain. 11/17/18   Ward, Chase PicketJaime Pilcher, PA-C  Prenatal-Fe Fum-Methf-FA w/o A (VITAFOL-NANO) 18-0.6-0.4 MG TABS Take 1 tablet by mouth daily. Patient not taking: Reported on 02/16/2018 09/18/17   Roe Coombsenney, Rachelle A, CNM    Family History Family History  Problem Relation Age of Onset  . Diabetes Maternal Grandmother   . Lupus Maternal Grandmother     Social History Social History   Tobacco Use  . Smoking status: Never Smoker  . Smokeless tobacco: Never Used  Substance Use Topics  . Alcohol use: No    Alcohol/week: 0.0 standard drinks  . Drug use: No     Allergies   Patient has no known allergies.   Review of Systems Review of Systems  Constitutional: Negative for fever.  HENT: Negative for sinus pressure.   Eyes: Negative  for redness.  Respiratory: Negative for shortness of breath.   Gastrointestinal: Negative for abdominal pain, nausea and vomiting.  Genitourinary: Negative for decreased urine volume, dysuria, flank pain, hematuria, vaginal bleeding, vaginal discharge and vaginal pain.  Musculoskeletal: Negative for back pain.  Skin: Negative for pallor and wound.  Neurological: Negative for light-headedness and headaches.     Physical Exam Updated Vital Signs BP (!) 141/90   Pulse 80   Temp 98.7 F (37.1 C) (Oral)   Resp 16   LMP 12/05/2018   SpO2 99%   Physical Exam Vitals signs and nursing note reviewed.  Constitutional:      General: She is not in acute distress.    Appearance: She is well-developed.  HENT:     Head: Normocephalic and atraumatic.     Mouth/Throat:     Pharynx: No oropharyngeal exudate.  Eyes:      Pupils: Pupils are equal, round, and reactive to light.  Neck:     Musculoskeletal: Normal range of motion.  Cardiovascular:     Rate and Rhythm: Regular rhythm.     Heart sounds: Normal heart sounds.  Pulmonary:     Effort: Pulmonary effort is normal. No respiratory distress.     Breath sounds: Normal breath sounds.  Abdominal:     General: Bowel sounds are normal. There is no distension.     Palpations: Abdomen is soft.     Tenderness: There is no abdominal tenderness.  Genitourinary:    Comments: Deferred per patient.  Musculoskeletal:        General: No tenderness or deformity.     Right lower leg: No edema.     Left lower leg: No edema.  Skin:    General: Skin is warm and dry.  Neurological:     Mental Status: She is alert and oriented to person, place, and time.      ED Treatments / Results  Labs (all labs ordered are listed, but only abnormal results are displayed) Labs Reviewed  URINALYSIS, ROUTINE W REFLEX MICROSCOPIC  GC/CHLAMYDIA PROBE AMP ([facility]    EKG None  Radiology No results found.  Procedures Procedures (including critical care time)  Medications Ordered in ED Medications  azithromycin (ZITHROMAX) tablet 1,000 mg (has no administration in time range)  cefTRIAXone (ROCEPHIN) injection 250 mg (has no administration in time range)     Initial Impression / Assessment and Plan / ED Course  I have reviewed the triage vital signs and the nursing notes.  Pertinent labs & imaging results that were available during my care of the patient were reviewed by me and considered in my medical decision making (see chart for details).       Patient with no pertinent past medical history presents to the ED requesting STD testing.  She reports she is asymptomatic, has no abdominal pain, fevers, vaginal discharge, vaginal bleeding or foul odor.  Reports was intimate with a partner whose girlfriend tested positive for gonorrhea and chlamydia.   She reports she used to obtain her OB/GYN care for me now however has stopped doing so as she is no longer pregnant.  During primary evaluation patient is well-appearing, vital signs are within normal limits.  Pelvic exam was deferred per patient as she is asymptomatic.  We will have her obtain swab for gonorrhea and chlamydia along with trichomonas.  Will also obtain a UA for screening of any urinary tract infections or trichomonas.  Patient is opted for obtaining treatment today  for her STD exposure, states she would like to be treated as she knows this person has tested positive.  We will treat her with Rocephin and azithromycin for gonorrhea and chlamydia.  Risks and benefits of antibiotic therapy have been discussed with patient who is agreeable on taking medication.  GC and Chlamydia was collected for patient, this was sent out results will not be available for next 3 days, she would like to be treated today.  Urinalysis showed no nitrites, leukocytes, white blood cell count.  Vital signs are within normal limits, patient is afebrile, abdominal appear soft without any tenderness.  Will have patient follow-up with health department for future STI encounters.  Return precautions discussed at length.   Portions of this note were generated with Lobbyist. Dictation errors may occur despite best attempts at proofreading.  Final Clinical Impressions(s) / ED Diagnoses   Final diagnoses:  Possible exposure to STD    ED Discharge Orders    None       Janeece Fitting, PA-C 01/05/19 [place], Ritchey, DO 01/05/19 2358

## 2019-01-05 NOTE — Discharge Instructions (Signed)
You were treated for gonorrhea and chlamydia today, please refrain from sexual intercourse for the next 10 days.  For future sexual transmitted infections please refer to the health department for further management of your symptoms.

## 2019-01-05 NOTE — ED Triage Notes (Signed)
Pt reports being informed of her previous boyfriend that his GF tested + for stds. Pt states she has no sxs.

## 2019-01-11 ENCOUNTER — Ambulatory Visit: Payer: Medicaid Other

## 2019-01-13 ENCOUNTER — Telehealth: Payer: Self-pay | Admitting: *Deleted

## 2019-01-13 NOTE — Telephone Encounter (Signed)
Pt called to office for lab results.  Reviewed chart, GC/CH yet resulted.  Spoke with lab and they states no specimen was received.  Pt was made aware but states she was treated at time of visit in ED.  Pt states she has also missed her cycle this month.  Pt has appt on 8/31 and will get UPT at that visit. Pt made aware she may take at home test if she chooses.

## 2019-01-18 ENCOUNTER — Ambulatory Visit: Payer: Medicaid Other

## 2019-01-21 ENCOUNTER — Ambulatory Visit (INDEPENDENT_AMBULATORY_CARE_PROVIDER_SITE_OTHER): Payer: Medicaid Other

## 2019-01-21 ENCOUNTER — Other Ambulatory Visit (HOSPITAL_COMMUNITY)
Admission: RE | Admit: 2019-01-21 | Discharge: 2019-01-21 | Disposition: A | Discharge status: Home / Self Care | Payer: Medicaid Other | Source: Ambulatory Visit | Attending: Obstetrics | Admitting: Obstetrics

## 2019-01-21 ENCOUNTER — Other Ambulatory Visit: Payer: Self-pay

## 2019-01-21 DIAGNOSIS — N898 Other specified noninflammatory disorders of vagina: Secondary | ICD-10-CM

## 2019-01-21 DIAGNOSIS — Z113 Encounter for screening for infections with a predominantly sexual mode of transmission: Secondary | ICD-10-CM | POA: Insufficient documentation

## 2019-01-21 DIAGNOSIS — Z3202 Encounter for pregnancy test, result negative: Secondary | ICD-10-CM

## 2019-01-21 DIAGNOSIS — N926 Irregular menstruation, unspecified: Secondary | ICD-10-CM

## 2019-01-21 LAB — POCT URINE PREGNANCY: Preg Test, Ur: NEGATIVE

## 2019-01-21 NOTE — Progress Notes (Signed)
Patient seen and assessed by nursing staff during this encounter. I have reviewed the chart and agree with the documentation and plan.  Mora Bellman, MD 01/21/2019 4:34 PM

## 2019-01-21 NOTE — Progress Notes (Signed)
SUBJECTIVE:  28 y.o. female complains of white vaginal discharge.  Denies abnormal vaginal bleeding or significant pelvic pain or fever. No UTI symptoms. Pt requests all STD testing. Pt also requests UPT d/t late menses. No desire to conceive at this time.     LMP: 11/29/2018    OBJECTIVE:  She appears well, afebrile. Urine dipstick: not done. UPT neg   ASSESSMENT:  Vaginal Discharge    PLAN:  Repeat UPT in 2 wks - pt prefers to do home UPT.  If UPT neg, contact the office to start [facility] - pt unsure which BC. Refrain from IC to avoid undesired pregnancy. If UPT pos, contact the office to start PN care. STD blood work, GC, chlamydia, trichomonas, BVAG, CVAG probe sent to lab. Treatment: To be determined once lab results are received ROV prn if symptoms persist or worsen.

## 2019-01-22 LAB — HEPATITIS C ANTIBODY: Hep C Virus Ab: <0.1 s/co ratio (ref 0.0–0.9)

## 2019-01-22 LAB — HEPATITIS B SURFACE ANTIGEN: Hepatitis B Surface Ag: NEGATIVE

## 2019-01-22 LAB — RPR: RPR Ser Ql: NONREACTIVE

## 2019-01-22 LAB — HIV ANTIBODY (ROUTINE TESTING W REFLEX): HIV Screen 4th Generation wRfx: NONREACTIVE

## 2019-01-23 LAB — CERVICOVAGINAL ANCILLARY ONLY
Bacterial vaginitis: POSITIVE — AB
Candida vaginitis: POSITIVE — AB
Chlamydia: NEGATIVE
Neisseria Gonorrhea: NEGATIVE
Trichomonas: POSITIVE — AB

## 2019-01-26 ENCOUNTER — Other Ambulatory Visit: Payer: Self-pay | Admitting: Obstetrics and Gynecology

## 2019-01-26 MED ORDER — METRONIDAZOLE 500 MG PO TABS: 500.0000 mg | ORAL_TABLET | Freq: Two times a day (BID) | ORAL | 0 refills | Status: DC

## 2019-01-26 MED ORDER — FLUCONAZOLE 150 MG PO TABS: 150.0000 mg | ORAL_TABLET | Freq: Once | ORAL | 0 refills | Status: AC

## 2019-01-27 ENCOUNTER — Telehealth: Payer: Self-pay

## 2019-01-27 NOTE — Telephone Encounter (Signed)
Patient called and reports that she picked up the medication for trich and she cannot swallow the pills. Pt would like to know if there's anything else that can be sent for her.

## 2019-01-29 ENCOUNTER — Other Ambulatory Visit: Payer: Self-pay | Admitting: Obstetrics

## 2019-01-29 DIAGNOSIS — A5901 Trichomonal vulvovaginitis: Secondary | ICD-10-CM

## 2019-01-29 MED ORDER — SOLOSEC 2 G PO PACK: 1.0000 | PACK | Freq: Once | ORAL | 2 refills | Status: AC

## 2019-02-17 ENCOUNTER — Telehealth: Payer: Self-pay | Admitting: *Deleted

## 2019-02-17 NOTE — Telephone Encounter (Signed)
Left voice message that she is having chest pain that started yesterday behind left breast.  Return call to patient. Pt stated she is having chest pain that started behind left breast and now in the right breat. Pt denies any SOB. The pain is sharp. Pt reported that when she cough/sneeze or move in a certain way it hurts. Advised patient that she needed to be seen in the emergency room for further evaluation.  Derl Barrow, RN

## 2019-03-02 ENCOUNTER — Ambulatory Visit: Payer: Medicaid Other

## 2019-03-09 ENCOUNTER — Other Ambulatory Visit (HOSPITAL_COMMUNITY)
Admission: RE | Admit: 2019-03-09 | Discharge: 2019-03-09 | Disposition: A | Discharge status: Home / Self Care | Payer: Medicaid Other | Source: Ambulatory Visit | Attending: Obstetrics | Admitting: Obstetrics

## 2019-03-09 ENCOUNTER — Ambulatory Visit (INDEPENDENT_AMBULATORY_CARE_PROVIDER_SITE_OTHER): Payer: Medicaid Other

## 2019-03-09 ENCOUNTER — Other Ambulatory Visit: Payer: Self-pay

## 2019-03-09 DIAGNOSIS — N898 Other specified noninflammatory disorders of vagina: Secondary | ICD-10-CM | POA: Insufficient documentation

## 2019-03-09 NOTE — Progress Notes (Signed)
Patient seen and assessed by nursing staff during this encounter. I have reviewed the chart and agree with the documentation and plan.  Mora Bellman, MD 03/09/2019 3:47 PM

## 2019-03-09 NOTE — Progress Notes (Signed)
Pt is in the office for TOC for positive Trich on 02-17-19. Pt requests full std screen today.

## 2019-03-16 ENCOUNTER — Telehealth: Payer: Self-pay

## 2019-03-16 NOTE — Telephone Encounter (Signed)
Returned call, no answer, left vm 

## 2019-03-17 LAB — CERVICOVAGINAL ANCILLARY ONLY
Bacterial Vaginitis (gardnerella): POSITIVE — AB
Candida Glabrata: NEGATIVE
Candida Vaginitis: NEGATIVE
Chlamydia: NEGATIVE
Comment: NEGATIVE
Comment: NEGATIVE
Comment: NEGATIVE
Comment: NEGATIVE
Comment: NEGATIVE
Comment: NORMAL
Neisseria Gonorrhea: NEGATIVE
Trichomonas: NEGATIVE

## 2019-03-18 ENCOUNTER — Other Ambulatory Visit: Payer: Self-pay | Admitting: Obstetrics and Gynecology

## 2019-03-18 MED ORDER — METRONIDAZOLE 500 MG PO TABS: 500.0000 mg | ORAL_TABLET | Freq: Two times a day (BID) | ORAL | 0 refills | Status: DC

## 2019-04-20 ENCOUNTER — Other Ambulatory Visit: Payer: Self-pay | Admitting: Obstetrics and Gynecology

## 2019-06-22 ENCOUNTER — Other Ambulatory Visit: Payer: Self-pay

## 2019-06-22 ENCOUNTER — Other Ambulatory Visit (HOSPITAL_COMMUNITY)
Admission: RE | Admit: 2019-06-22 | Discharge: 2019-06-22 | Disposition: A | Discharge status: Home / Self Care | Payer: Medicaid Other | Source: Ambulatory Visit | Attending: Obstetrics | Admitting: Obstetrics

## 2019-06-22 ENCOUNTER — Ambulatory Visit (INDEPENDENT_AMBULATORY_CARE_PROVIDER_SITE_OTHER): Payer: Medicaid Other

## 2019-06-22 VITALS — BP 130/88 | HR 76 | Ht 63.0 in | Wt 232.8 lb

## 2019-06-22 DIAGNOSIS — Z113 Encounter for screening for infections with a predominantly sexual mode of transmission: Secondary | ICD-10-CM

## 2019-06-22 NOTE — Progress Notes (Signed)
  SUBJECTIVE:  29 y.o. female presents for STD Screening. She and her Spouse get checked at the start of each year. Denies abnormal vaginal bleeding or significant pelvic pain or fever. No UTI symptoms. Denies history of known exposure to STD.  Patient's last menstrual period was 05/26/2019 (exact date).  OBJECTIVE:  She appears well, afebrile. Urine dipstick: not done.  ASSESSMENT:  Annual STD Screening.  PLAN:  GC, chlamydia, trichomonas, BVAG, CVAG probe sent to lab. Blood drawn and sent to Lab. Treatment: To be determined once lab results are received ROV prn if symptoms persist or worsen.

## 2019-06-23 ENCOUNTER — Other Ambulatory Visit: Payer: Self-pay | Admitting: Obstetrics

## 2019-06-23 DIAGNOSIS — B9689 Other specified bacterial agents as the cause of diseases classified elsewhere: Secondary | ICD-10-CM

## 2019-06-23 LAB — CERVICOVAGINAL ANCILLARY ONLY
Bacterial Vaginitis (gardnerella): POSITIVE — AB
Candida Glabrata: NEGATIVE
Candida Vaginitis: NEGATIVE
Chlamydia: NEGATIVE
Comment: NEGATIVE
Comment: NEGATIVE
Comment: NEGATIVE
Comment: NEGATIVE
Comment: NEGATIVE
Comment: NORMAL
Neisseria Gonorrhea: NEGATIVE
Trichomonas: NEGATIVE

## 2019-06-23 LAB — HEPATITIS C ANTIBODY: Hep C Virus Ab: <0.1 s/co ratio (ref 0.0–0.9)

## 2019-06-23 LAB — HEPATITIS B SURFACE ANTIGEN: Hepatitis B Surface Ag: NEGATIVE

## 2019-06-23 LAB — RPR: RPR Ser Ql: NONREACTIVE

## 2019-06-23 LAB — HIV ANTIBODY (ROUTINE TESTING W REFLEX): HIV Screen 4th Generation wRfx: NONREACTIVE

## 2019-06-23 MED ORDER — SOLOSEC 2 G PO PACK: 1.0000 | PACK | Freq: Once | ORAL | 2 refills | Status: AC

## 2019-06-26 ENCOUNTER — Other Ambulatory Visit: Payer: Self-pay

## 2019-06-26 ENCOUNTER — Emergency Department (HOSPITAL_COMMUNITY)
Admission: EM | Admit: 2019-06-26 | Discharge: 2019-06-26 | Disposition: A | Discharge status: Home / Self Care | Payer: Medicaid Other | Attending: Emergency Medicine | Admitting: Emergency Medicine

## 2019-06-26 DIAGNOSIS — Z20822 Contact with and (suspected) exposure to covid-19: Secondary | ICD-10-CM | POA: Diagnosis not present

## 2019-06-26 DIAGNOSIS — R05 Cough: Secondary | ICD-10-CM | POA: Diagnosis not present

## 2019-06-26 DIAGNOSIS — O99511 Diseases of the respiratory system complicating pregnancy, first trimester: Secondary | ICD-10-CM | POA: Diagnosis not present

## 2019-06-26 DIAGNOSIS — Z3A01 Less than 8 weeks gestation of pregnancy: Secondary | ICD-10-CM | POA: Diagnosis not present

## 2019-06-26 DIAGNOSIS — Z3201 Encounter for pregnancy test, result positive: Secondary | ICD-10-CM | POA: Diagnosis not present

## 2019-06-26 DIAGNOSIS — O98511 Other viral diseases complicating pregnancy, first trimester: Secondary | ICD-10-CM | POA: Diagnosis not present

## 2019-06-26 DIAGNOSIS — O26891 Other specified pregnancy related conditions, first trimester: Secondary | ICD-10-CM | POA: Diagnosis not present

## 2019-06-26 DIAGNOSIS — R059 Cough, unspecified: Secondary | ICD-10-CM

## 2019-06-26 NOTE — ED Provider Notes (Signed)
[facility] EMERGENCY DEPARTMENT Provider Note   CSN: [id number] Arrival date & time: 06/26/19  7829     History Chief Complaint  Patient presents with  . Possible Pregnancy    Annette Wood is a 29 y.o. female presenting for evaluation of possible pregnancy and cough.  Patient states she has abnormal periods.  She thought her period was supposed to start yesterday, but did not come.  She took a home pregnancy test today which was positive.  She does not know when her last period was.  She has not been contraception for a year, states 1 year ago she had her IUD removed.  She is sexually active with a female partner, states they do use condoms.  She denies nausea, vomiting, abdominal pain, vaginal bleeding, vaginal discharge.  She denies urinary symptoms.  She wants to know how far along she is and what her "options are" for the pregnancy. Additionally, patient states she has had a cough since yesterday.  It is productive of mucus.  She denies fevers, chills, nasal congestion, sore throat, chest pain, shortness of breath.  She denies sick contacts or contact with known COVID-19 positive person.  HPI     Past Medical History:  Diagnosis Date  . Chlamydia   . Gallstones   . Gastroesophageal reflux   . Gonorrhea   . Panic attack     Patient Active Problem List   Diagnosis Date Noted  . Hypertension 09/18/2017  . ASCUS of cervix with negative high risk HPV 08/18/2017  . Unspecified vitamin D deficiency 08/27/2012    Past Surgical History:  Procedure Laterality Date  . CHOLECYSTECTOMY N/A 11/24/2012   Procedure: LAPAROSCOPIC CHOLECYSTECTOMY WITH INTRAOPERATIVE CHOLANGIOGRAM;  Surgeon: Mariella Saa, MD;  Location: [facility];  Service: General;  Laterality: N/A;  . CHOLECYSTECTOMY       OB History    Gravida  3   Para  3   Term  3   Preterm      AB      Living  3     SAB      TAB      Ectopic      Multiple      Live Births  3             Family History  Problem Relation Age of Onset  . Diabetes Maternal Grandmother   . Lupus Maternal Grandmother     Social History   Tobacco Use  . Smoking status: Never Smoker  . Smokeless tobacco: Never Used  Substance Use Topics  . Alcohol use: No    Alcohol/week: 0.0 standard drinks  . Drug use: No    Home Medications Prior to Admission medications   Medication Sig Start Date End Date Taking? Authorizing Provider  labetalol (NORMODYNE) 100 MG tablet Take 1 tablet (100 mg total) by mouth 2 (two) times daily. Patient not taking: Reported on 02/16/2018 09/18/17   Orvilla Cornwall A, CNM  methocarbamol (ROBAXIN) 500 MG tablet Take 1 tablet (500 mg total) by mouth 2 (two) times daily as needed (back pain). Patient not taking: Reported on 03/09/2019 11/17/18   Ward, Chase Picket, PA-C  metroNIDAZOLE (FLAGYL) 500 MG tablet Take 1 tablet (500 mg total) by mouth 2 (two) times daily. Patient not taking: Reported on 03/09/2019 01/26/19   Constant, Peggy, MD  metroNIDAZOLE (FLAGYL) 500 MG tablet Take 1 tablet (500 mg total) by mouth 2 (two) times daily. Patient not taking: Reported on 06/22/2019  03/18/19   Constant, Peggy, MD  metroNIDAZOLE (METROGEL) 0.75 % vaginal gel PLACE 1 APPLICATORFUL VAGINALLY 2 (TWO) TIMES A WEEK. FOR 4-6 MONTHS. Patient not taking: Reported on 03/09/2019 11/16/18   Shelly Bombard, MD  naproxen (NAPROSYN) 500 MG tablet Take 1 tablet (500 mg total) by mouth 2 (two) times daily as needed for mild pain or moderate pain. Patient not taking: Reported on 03/09/2019 11/17/18   Ward, York Cerise Pilcher, PA-C  Prenatal-Fe Fum-Methf-FA w/o A (VITAFOL-NANO) 18-0.6-0.4 MG TABS Take 1 tablet by mouth daily. Patient not taking: Reported on 02/16/2018 09/18/17   Morene Crocker, CNM    Allergies    Patient has no known allergies.  Review of Systems   Review of Systems  Respiratory: Positive for cough.   Genitourinary:       Missed period  All other systems reviewed and are  negative.   Physical Exam Updated Vital Signs BP (!) 121/95 (BP Location: Left Arm)   Pulse 100   Temp 97.7 F (36.5 C) (Oral)   Resp 18   Ht 5\' 4"  (1.626 m)   Wt 106.6 kg   LMP 05/26/2019   SpO2 97%   BMI 40.34 kg/m   Physical Exam Vitals and nursing note reviewed.  Constitutional:      General: She is not in acute distress.    Appearance: She is well-developed.     Comments: Sitting comfortably in the bed in no acute distress  HENT:     Head: Normocephalic and atraumatic.  Eyes:     Extraocular Movements: Extraocular movements intact.     Conjunctiva/sclera: Conjunctivae normal.     Pupils: Pupils are equal, round, and reactive to light.  Cardiovascular:     Rate and Rhythm: Normal rate and regular rhythm.     Pulses: Normal pulses.  Pulmonary:     Effort: Pulmonary effort is normal. No respiratory distress.     Breath sounds: Normal breath sounds. No wheezing.     Comments: Speaking in full sentences.  Clear lung sounds in all fields.  Sats stable on room air.  No accessory muscle use or signs of respiratory distress. Abdominal:     General: There is no distension.     Palpations: Abdomen is soft. There is no mass.     Tenderness: There is no abdominal tenderness. There is no guarding or rebound.     Comments: Obese abdomen.  Soft nontender.  Musculoskeletal:        General: Normal range of motion.     Cervical back: Normal range of motion and neck supple.  Skin:    General: Skin is warm and dry.     Capillary Refill: Capillary refill takes less than 2 seconds.  Neurological:     Mental Status: She is alert and oriented to person, place, and time.     ED Results / Procedures / Treatments   Labs (all labs ordered are listed, but only abnormal results are displayed) Labs Reviewed  POC URINE PREG, ED    EKG None  Radiology No results found.  Procedures Procedures (including critical care time)  Medications Ordered in ED Medications - No data to  display  ED Course  I have reviewed the triage vital signs and the nursing notes.  Pertinent labs & imaging results that were available during my care of the patient were reviewed by me and considered in my medical decision making (see chart for details).    MDM Rules/Calculators/A&P  Patient presenting for evaluation of cough and possible pregnancy.  Physical examination, she appears nontoxic.  Patient does not know when her last period was, thus making estimated gestational age difficult.  Pregnancy test was positive, confirmed verbally with lab.  As patient is without pain, bleeding, or discharge, I do not believe she needs emergent ultrasound today. Additionally, patient has a cough.  No other symptoms.  Consider Covid versus other viral illness.  No fevers and reassuring pulmonary exam, as such doubt pneumonia.  I do not feel she needs chest x-ray today.  Offered Covid test, patient declined.  I recommend patient follow-up with her OB/GYN regarding approximate gestation and options for pregnancy.  Discussed with patient that if she is not getting tested for Covid, she will need to quarantine for 10 days with presumed Covid.  Discussed close monitoring of respiratory status.  At this time, patient appears safe for discharge.  Return precautions given.  Patient states she understands and agrees to plan.  Final Clinical Impression(s) / ED Diagnoses Final diagnoses:  Less than [redacted] weeks gestation of pregnancy  Cough    Rx / DC Orders ED Discharge Orders    None       Alveria Apley, PA-C 06/26/19 1141    Terald Sleeper, MD 06/26/19 1549

## 2019-06-26 NOTE — Discharge Instructions (Signed)
Since you did not get a Covid test today, you should quarantine as if you have coronavirus. Quarantine at home for a minimum of 10 days.  You may end quarantine if you are fever free and your symptoms are improving after 10 days. Treat symptomatically.  Use Tylenol as needed for headache, body aches, fever. Use cough drops as needed for cough. Make sure you are staying well-hydrated water.  Your pregnancy test today was positive.  Follow-up with your OB/GYN for further evaluation and management of the pregnancy.  Return to the emergency room if any new, worsening, concerning symptoms.

## 2019-06-26 NOTE — ED Triage Notes (Signed)
Pt reports she was supposed to start  Her menses yesterday but did not. Pt wants a urine test pregnancy.

## 2019-06-26 NOTE — ED Notes (Signed)
Pt voices understanding of DC instructions . Pt ambulatory at DC to lobby.

## 2019-06-28 LAB — POC URINE PREG, ED: Preg Test, Ur: POSITIVE — AB

## 2019-07-01 ENCOUNTER — Ambulatory Visit (INDEPENDENT_AMBULATORY_CARE_PROVIDER_SITE_OTHER): Payer: Medicaid Other

## 2019-07-01 ENCOUNTER — Other Ambulatory Visit: Payer: Self-pay

## 2019-07-01 ENCOUNTER — Telehealth: Payer: Self-pay | Admitting: *Deleted

## 2019-07-01 DIAGNOSIS — N912 Amenorrhea, unspecified: Secondary | ICD-10-CM

## 2019-07-01 NOTE — Progress Notes (Signed)
Patient seen and assessed by nursing staff during this encounter. I have reviewed the chart and agree with the documentation and plan.  Catalina Antigua, MD 07/01/2019 10:38 AM

## 2019-07-01 NOTE — Telephone Encounter (Signed)
Spoke with pt that had questions about her conception date. Pt made aware of approx conception time given her LMP of 05/26/19.

## 2019-07-01 NOTE — Progress Notes (Signed)
Pt presents for pregnancy test. Patient had a positive test on 06/26/19 at [facility] ED. Pregnancy test not repeated today. She has not had any bleeding.Patient states that she was told that she would have to schedule an appt to see how far along she was. She was expecting an u/s. Pt advised that this is just a nurse visit to establish EDD. She was advised that she will be rescheduled for a provider visit for an initial OB around 11 weeks. LMP 05/26/19. EDD 03/01/20. Patient is [redacted]w[redacted]d. I educated patient that if she experiences any bleeding and severe cramping that she would need to be evaluated at [facility].

## 2019-07-06 ENCOUNTER — Other Ambulatory Visit (HOSPITAL_COMMUNITY)
Admission: RE | Admit: 2019-07-06 | Discharge: 2019-07-06 | Disposition: A | Discharge status: Home / Self Care | Payer: Medicaid Other | Source: Ambulatory Visit | Attending: Obstetrics | Admitting: Obstetrics

## 2019-07-06 ENCOUNTER — Ambulatory Visit: Payer: Medicaid Other

## 2019-07-06 ENCOUNTER — Other Ambulatory Visit: Payer: Self-pay

## 2019-07-06 DIAGNOSIS — N898 Other specified noninflammatory disorders of vagina: Secondary | ICD-10-CM | POA: Insufficient documentation

## 2019-07-06 NOTE — Progress Notes (Signed)
Pt is in the office for self swab, complains of vaginal irritation. Pt recently treated for BV.

## 2019-07-07 ENCOUNTER — Other Ambulatory Visit: Payer: Self-pay | Admitting: Obstetrics and Gynecology

## 2019-07-07 LAB — CERVICOVAGINAL ANCILLARY ONLY
Bacterial Vaginitis (gardnerella): NEGATIVE
Candida Glabrata: NEGATIVE
Candida Vaginitis: POSITIVE — AB
Comment: NEGATIVE
Comment: NEGATIVE
Comment: NEGATIVE

## 2019-07-07 MED ORDER — TERCONAZOLE 0.8 % VA CREA: 1.0000 | TOPICAL_CREAM | Freq: Every day | VAGINAL | 0 refills | Status: DC

## 2019-07-07 NOTE — Progress Notes (Signed)
Patient seen and assessed by nursing staff during this encounter. I have reviewed the chart and agree with the documentation and plan.  Catalina Antigua, MD 07/07/2019 3:01 PM

## 2019-08-12 ENCOUNTER — Ambulatory Visit: Payer: Medicaid Other

## 2019-08-26 ENCOUNTER — Telehealth: Payer: Self-pay | Admitting: *Deleted

## 2019-08-26 ENCOUNTER — Encounter: Payer: Self-pay | Admitting: *Deleted

## 2019-08-26 NOTE — Telephone Encounter (Signed)
Pt called in to office stating that she would like to be on birth control pills until she can have Nexplanon placed in May.   Pt states she recently had an abortion about 1 month ago and just stopped bleeding last week.   Pt has not been seen in office for an AEX since 2019.  Pt advised will send message to provider regarding Rx.  Please advise.

## 2019-08-26 NOTE — Telephone Encounter (Signed)
Constant, Peggy, MD  You 38 minutes ago (12:43 PM)   She was last seen 06/2019 with a positive pregnancy test. She needs to come in for pregnancy test, if negative ok to prescribe 1 pack sprintec   Message text    Pt has been scheduled for UPT.

## 2019-09-07 ENCOUNTER — Emergency Department (HOSPITAL_COMMUNITY)
Admission: EM | Admit: 2019-09-07 | Discharge: 2019-09-07 | Disposition: A | Discharge status: Home / Self Care | Payer: Medicaid Other | Attending: Emergency Medicine | Admitting: Emergency Medicine

## 2019-09-07 ENCOUNTER — Emergency Department (HOSPITAL_COMMUNITY): Payer: Medicaid Other

## 2019-09-07 DIAGNOSIS — Z23 Encounter for immunization: Secondary | ICD-10-CM | POA: Diagnosis not present

## 2019-09-07 DIAGNOSIS — Y92008 Other place in unspecified non-institutional (private) residence as the place of occurrence of the external cause: Secondary | ICD-10-CM | POA: Insufficient documentation

## 2019-09-07 DIAGNOSIS — S90112A Contusion of left great toe without damage to nail, initial encounter: Secondary | ICD-10-CM | POA: Diagnosis not present

## 2019-09-07 DIAGNOSIS — S91212A Laceration without foreign body of left great toe with damage to nail, initial encounter: Secondary | ICD-10-CM | POA: Diagnosis not present

## 2019-09-07 DIAGNOSIS — I10 Essential (primary) hypertension: Secondary | ICD-10-CM | POA: Insufficient documentation

## 2019-09-07 DIAGNOSIS — S90212A Contusion of left great toe with damage to nail, initial encounter: Secondary | ICD-10-CM

## 2019-09-07 DIAGNOSIS — W208XXA Other cause of strike by thrown, projected or falling object, initial encounter: Secondary | ICD-10-CM | POA: Insufficient documentation

## 2019-09-07 DIAGNOSIS — Y9389 Activity, other specified: Secondary | ICD-10-CM | POA: Diagnosis not present

## 2019-09-07 DIAGNOSIS — S97112A Crushing injury of left great toe, initial encounter: Secondary | ICD-10-CM | POA: Diagnosis not present

## 2019-09-07 DIAGNOSIS — Y999 Unspecified external cause status: Secondary | ICD-10-CM | POA: Insufficient documentation

## 2019-09-07 DIAGNOSIS — R58 Hemorrhage, not elsewhere classified: Secondary | ICD-10-CM | POA: Diagnosis not present

## 2019-09-07 DIAGNOSIS — Z79899 Other long term (current) drug therapy: Secondary | ICD-10-CM | POA: Insufficient documentation

## 2019-09-07 DIAGNOSIS — S61319A Laceration without foreign body of unspecified finger with damage to nail, initial encounter: Secondary | ICD-10-CM

## 2019-09-07 MED ORDER — BACITRACIN ZINC 500 UNIT/GM EX OINT: 1.0000 "application " | TOPICAL_OINTMENT | Freq: Two times a day (BID) | CUTANEOUS | 0 refills | Status: DC

## 2019-09-07 MED ORDER — BACITRACIN ZINC 500 UNIT/GM EX OINT
TOPICAL_OINTMENT | Freq: Two times a day (BID) | CUTANEOUS | Status: DC
  Filled 2019-09-07: qty 0.9

## 2019-09-07 MED ORDER — DOXYCYCLINE HYCLATE 100 MG PO CAPS: 100.0000 mg | ORAL_CAPSULE | Freq: Two times a day (BID) | ORAL | 0 refills | Status: DC

## 2019-09-07 MED ORDER — TETANUS-DIPHTH-ACELL PERTUSSIS 5-2.5-18.5 LF-MCG/0.5 IM SUSP
0.5000 mL | Freq: Once | INTRAMUSCULAR | Status: AC
  Administered 2019-09-07: 13:00:00 0.5 mL via INTRAMUSCULAR
  Filled 2019-09-07: qty 0.5

## 2019-09-07 NOTE — ED Provider Notes (Signed)
[facility]-EMERGENCY DEPT Provider Note   CSN: [id number] Arrival date & time: 09/07/19  1050     History Chief Complaint  Patient presents with  . Toe Injury    Annette Wood is a 29 y.o. female.  HPI     29 year old comes in a chief complaint of toe injury.  Patient reports that at her residence there is a loose/defected window  Her niece was playing around the window, which came loose and fell on patient's left big toe.  Patient reports that she was trying to prevent the window from falling on her knees.  She started having immediate bleeding and called EMS.  Tetanus status is not up-to-date. Patient is having numbness to the toe and also has pain over the toe.  Past Medical History:  Diagnosis Date  . Chlamydia   . Gallstones   . Gastroesophageal reflux   . Gonorrhea   . Panic attack     Patient Active Problem List   Diagnosis Date Noted  . Hypertension 09/18/2017  . ASCUS of cervix with negative high risk HPV 08/18/2017  . Unspecified vitamin D deficiency 08/27/2012    Past Surgical History:  Procedure Laterality Date  . CHOLECYSTECTOMY N/A 11/24/2012   Procedure: LAPAROSCOPIC CHOLECYSTECTOMY WITH INTRAOPERATIVE CHOLANGIOGRAM;  Surgeon: Mariella Saa, MD;  Location: [facility];  Service: General;  Laterality: N/A;  . CHOLECYSTECTOMY       OB History    Gravida  4   Para  3   Term  3   Preterm      AB      Living  3     SAB      TAB      Ectopic      Multiple      Live Births  3           Family History  Problem Relation Age of Onset  . Diabetes Maternal Grandmother   . Lupus Maternal Grandmother     Social History   Tobacco Use  . Smoking status: Never Smoker  . Smokeless tobacco: Never Used  Substance Use Topics  . Alcohol use: No    Alcohol/week: 0.0 standard drinks  . Drug use: No    Home Medications Prior to Admission medications   Medication Sig Start Date End Date Taking? Authorizing  Provider  bacitracin ointment Apply 1 application topically 2 (two) times daily. 09/07/19   Derwood Kaplan, MD  doxycycline (VIBRAMYCIN) 100 MG capsule Take 1 capsule (100 mg total) by mouth 2 (two) times daily. 09/07/19   Derwood Kaplan, MD  labetalol (NORMODYNE) 100 MG tablet Take 1 tablet (100 mg total) by mouth 2 (two) times daily. Patient not taking: Reported on 02/16/2018 09/18/17   Orvilla Cornwall A, CNM  methocarbamol (ROBAXIN) 500 MG tablet Take 1 tablet (500 mg total) by mouth 2 (two) times daily as needed (back pain). Patient not taking: Reported on 03/09/2019 11/17/18   Ward, Chase Picket, PA-C  metroNIDAZOLE (FLAGYL) 500 MG tablet Take 1 tablet (500 mg total) by mouth 2 (two) times daily. Patient not taking: Reported on 03/09/2019 01/26/19   Constant, Peggy, MD  metroNIDAZOLE (FLAGYL) 500 MG tablet Take 1 tablet (500 mg total) by mouth 2 (two) times daily. Patient not taking: Reported on 06/22/2019 03/18/19   Constant, Peggy, MD  metroNIDAZOLE (METROGEL) 0.75 % vaginal gel PLACE 1 APPLICATORFUL VAGINALLY 2 (TWO) TIMES A WEEK. FOR 4-6 MONTHS. Patient not taking: Reported on 03/09/2019 11/16/18  Shelly Bombard, MD  naproxen (NAPROSYN) 500 MG tablet Take 1 tablet (500 mg total) by mouth 2 (two) times daily as needed for mild pain or moderate pain. Patient not taking: Reported on 03/09/2019 11/17/18   Ward, York Cerise Pilcher, PA-C  Prenatal-Fe Fum-Methf-FA w/o A (VITAFOL-NANO) 18-0.6-0.4 MG TABS Take 1 tablet by mouth daily. Patient not taking: Reported on 02/16/2018 09/18/17   Kandis Cocking A, CNM  terconazole (TERAZOL 3) 0.8 % vaginal cream Place 1 applicator vaginally at bedtime. Apply nightly for three nights. Patient not taking: Reported on 09/07/2019 07/07/19   Constant, Peggy, MD    Allergies    Patient has no known allergies.  Review of Systems   Review of Systems  Constitutional: Positive for activity change.  Skin: Positive for wound.  Allergic/Immunologic: Negative for  immunocompromised state.  Neurological: Positive for numbness.    Physical Exam Updated Vital Signs BP (!) 153/115 (BP Location: Right Arm)   Pulse 77   Temp 98.7 F (37.1 C)   Resp 16   LMP 09/06/2019 (Exact Date)   SpO2 100%   Physical Exam Vitals and nursing note reviewed.  Constitutional:      Appearance: She is well-developed.  HENT:     Head: Normocephalic and atraumatic.  Cardiovascular:     Rate and Rhythm: Normal rate.  Pulmonary:     Effort: Pulmonary effort is normal.  Abdominal:     General: Bowel sounds are normal.  Musculoskeletal:        General: Tenderness and signs of injury present.     Cervical back: Normal range of motion and neck supple.     Comments: Patient has abrasion/superficial laceration just proximal to the nailbed of the left great toe.  She also has laceration of the nail which does not extend into the matrix/nail base.   Skin:    General: Skin is warm and dry.  Neurological:     Mental Status: She is alert and oriented to person, place, and time.     Comments: Patient has numbness over the medial aspect of the great toe and distal plantar surface of the toe.  She has sensation over the nail bed region and also the lateral aspect of the toe.     ED Results / Procedures / Treatments   Labs (all labs ordered are listed, but only abnormal results are displayed) Labs Reviewed - No data to display  EKG None  Radiology DG Toe Great Left  Result Date: 09/07/2019 CLINICAL DATA:  Left big toe crush injury. EXAM: LEFT GREAT TOE COMPARISON:  None. FINDINGS: There is no evidence of fracture or dislocation. There is no evidence of arthropathy or other focal bone abnormality. Soft tissues are unremarkable. IMPRESSION: Negative. Electronically Signed   By: Marijo Conception M.D.   On: 09/07/2019 12:50    Procedures Procedures (including critical care time)  Medications Ordered in ED Medications  bacitracin ointment ( Topical Given 09/07/19 1330)    Tdap (BOOSTRIX) injection 0.5 mL (0.5 mLs Intramuscular Given 09/07/19 1309)    ED Course  I have reviewed the triage vital signs and the nursing notes.  Pertinent labs & imaging results that were available during my care of the patient were reviewed by me and considered in my medical decision making (see chart for details).    MDM Rules/Calculators/A&P                      29 year old comes in a chief complaint of  toe pain.  Patient had a window fall onto her left great toe.  She is complaining of pain.  She has some numbness.  On exam patient is noted to have abrasion of the dorsal part of the toe, just proximal to the nail.  The nail itself is intact in the nail bed.  She does have a distal nail laceration.  There could be nailbed injury.  Patient has declined numbing of the toe and thorough evaluation, irrigation.  X-ray of the toes reassuring.  She did agree to clean the wound herself and on assessment it seems like the laceration just proximal to the nail is superficial and likely will be okay without repair.  I am concerned that there could be a nailbed laceration -but patient wants to manage that conservatively.  Overall we discussed the risk for infection being high without [place] being able to clean the wound properly.  Patient prefers conservative management at home.  We will dressed the wound.  Final Clinical Impression(s) / ED Diagnoses Final diagnoses:  Contusion of left great toe with damage to nail, initial encounter  Laceration of nail bed of finger, initial encounter    Rx / DC Orders ED Discharge Orders         Ordered    bacitracin ointment  2 times daily     09/07/19 1333    doxycycline (VIBRAMYCIN) 100 MG capsule  2 times daily     09/07/19 1334           Derwood Kaplan, MD 09/09/19 [phone]

## 2019-09-07 NOTE — ED Notes (Signed)
Left great toe cleaned with wound cleanser. Patient has a telfa dressing applied with bacitracin. Great toe buddy taped to toe beside it. Patient reports a decrease in pain.

## 2019-09-07 NOTE — ED Triage Notes (Signed)
Per EMS- Patient states she was trying to keep a window from falling on her child and the window fell on her left big toe. EMS states nail bed trauma and small amount of bleeding noted.

## 2019-09-07 NOTE — Discharge Instructions (Signed)
We saw you in the ER for your WOUND. °Please read the instructions provided on wound care. °Keep the area clean and dry, apply bacitracin ointment daily and take the medications provided. °RETURN TO THE ER IF THERE IS INCREASED PAIN, REDNESS, PUS COMING OUT from the wound site.  °

## 2019-09-22 ENCOUNTER — Ambulatory Visit: Payer: Medicaid Other | Admitting: Certified Nurse Midwife

## 2019-09-27 ENCOUNTER — Other Ambulatory Visit: Payer: Self-pay

## 2019-09-27 ENCOUNTER — Ambulatory Visit: Payer: Medicaid Other

## 2019-09-27 VITALS — BP 134/82 | HR 91 | Ht 63.0 in | Wt 237.0 lb

## 2019-09-27 DIAGNOSIS — Z3202 Encounter for pregnancy test, result negative: Secondary | ICD-10-CM | POA: Diagnosis not present

## 2019-09-27 DIAGNOSIS — Z30017 Encounter for initial prescription of implantable subdermal contraceptive: Secondary | ICD-10-CM

## 2019-09-27 LAB — POCT URINE PREGNANCY: Preg Test, Ur: NEGATIVE

## 2019-09-27 MED ORDER — ETONOGESTREL 68 MG ~~LOC~~ IMPL
68.0000 mg | DRUG_IMPLANT | Freq: Once | SUBCUTANEOUS | Status: AC
  Administered 2019-09-27: 68 mg via SUBCUTANEOUS

## 2019-09-27 NOTE — Progress Notes (Signed)
Pt is in the office for nexplanon insertion. 

## 2019-09-27 NOTE — Progress Notes (Signed)
   OFFICE VISIT PROCEDURE NOTE  Nexplanon Insertion Procedure:  Norva Karvonen requests insertion of Nexplanon for contraception method.  She understands the risks associated with insertion including pain, bleeding, infection, and paresthesias of the arm.  Patient also understands that Nexplanon can cause change in bleeding.  However, patient accepts and understands all these risks and desires to proceed.  Nexplanon inserted as below.  Informed consent signed.   Patient's non-dominant left arm was identified prepped and draped in the usual sterile fashion. The area was identified ~4 cm from previous removal site that has apparent scar tissue and keloids. The area was prepped with alcohol swab and then injected with 100mL of 1% lidocaine along the anticipated insertion route.  The area was then prepped with betadine and allowed 60 seconds before being wiped away with sterile gauze. Nexplanon was removed from packaging and device was confirmed within needle by provider visualization.   The device was then inserted per manufacturers instruction without complications.  The device was then palpated in the patient's arm by patient and provider. The insertion area was hemostatic with pressure and covered with steri strip and band-aid.  The arm was then wrapped with pressure dressing.  The patient tolerated the procedure well and was given post procedure instructions to include: *Remove of pressure dressing and band-aid in 12-24 hours. *Remove of steri-strips after no more than 7 days. *Condom usage or abstinence for the next 7 days. *Tylenol or ibuprofen for insertion pain/discomfort. *Call for any other questions, concerns, or complications.   Patient to return to office for regularly scheduled annual visit in 3-6 months.   Cherre Robins MSN, CNM 09/27/2019

## 2019-09-27 NOTE — Patient Instructions (Signed)
Nexplanon Instructions After Insertion  Keep bandage clean and dry for 24 hours  May use ice/Tylenol/Ibuprofen for soreness or pain  If you develop fever, drainage or increased warmth from incision site-contact office immediately   

## 2019-10-06 ENCOUNTER — Ambulatory Visit: Payer: Medicaid Other

## 2019-10-15 ENCOUNTER — Telehealth: Payer: Self-pay | Admitting: *Deleted

## 2019-10-15 NOTE — Telephone Encounter (Signed)
Pt called to office, LM stating she recently got Nexplanon placed and is now having longer/heavier bleeding. Asking for pills to help with bleeding.  Attempt to return call. No answer, call then dropped x2.

## 2019-11-26 ENCOUNTER — Inpatient Hospital Stay
Admission: RE | Admit: 2019-11-26 | Discharge: 2019-11-26 | Disposition: A | Discharge status: Home / Self Care | Payer: Medicaid Other | Source: Ambulatory Visit

## 2019-12-01 ENCOUNTER — Ambulatory Visit: Payer: Medicaid Other | Admitting: Obstetrics

## 2019-12-16 ENCOUNTER — Ambulatory Visit: Payer: Medicaid Other | Admitting: Obstetrics

## 2020-01-05 ENCOUNTER — Ambulatory Visit: Payer: Medicaid Other | Admitting: Obstetrics

## 2020-02-03 ENCOUNTER — Other Ambulatory Visit: Payer: Self-pay

## 2020-02-03 ENCOUNTER — Ambulatory Visit (HOSPITAL_COMMUNITY)
Admission: EM | Admit: 2020-02-03 | Discharge: 2020-02-03 | Discharge status: Against Medical Advice | Payer: Medicaid Other | Attending: Internal Medicine | Admitting: Internal Medicine

## 2020-02-03 ENCOUNTER — Encounter (HOSPITAL_COMMUNITY): Payer: Self-pay | Admitting: Emergency Medicine

## 2020-02-03 DIAGNOSIS — R519 Headache, unspecified: Secondary | ICD-10-CM | POA: Insufficient documentation

## 2020-02-03 DIAGNOSIS — R0989 Other specified symptoms and signs involving the circulatory and respiratory systems: Secondary | ICD-10-CM | POA: Insufficient documentation

## 2020-02-03 DIAGNOSIS — U071 COVID-19: Secondary | ICD-10-CM | POA: Insufficient documentation

## 2020-02-03 NOTE — ED Notes (Signed)
Provider states patient was not in room. Pt was not found on the premises after triage.

## 2020-02-03 NOTE — ED Triage Notes (Signed)
Pt states her sister and her nieces and nephews tested positive for covid. Pt is having a runny nose and headaches. Pt denies fever, body aches or chills.

## 2020-02-04 LAB — SARS CORONAVIRUS 2 (TAT 6-24 HRS): SARS Coronavirus 2: POSITIVE — AB

## 2020-02-05 ENCOUNTER — Telehealth: Payer: Self-pay | Admitting: Family

## 2020-02-05 NOTE — Telephone Encounter (Signed)
Called to Discuss with patient about Covid symptoms and the use of the monoclonal antibody infusion for those with mild to moderate Covid symptoms and at a high risk of hospitalization.     Pt appears to qualify for this infusion due to co-morbid conditions and/or a member of an at-risk group in accordance with the FDA Emergency Use Authorization.   Ms. Sensabaugh was seen at Urgent Care in [place] where she was having headaches and congestion. COVID testing was found to be positive. Qualifying risk factors include BMI >25 and hypertension.  Contacted Ms. Desta and began discussing with her and the connection was lost. Additional information will be sent to MyChart and text message to follow up if interested.  Marcos Eke, NP 02/05/2020 11:18 AM

## 2020-03-07 ENCOUNTER — Other Ambulatory Visit (HOSPITAL_COMMUNITY)
Admission: RE | Admit: 2020-03-07 | Discharge: 2020-03-07 | Disposition: A | Discharge status: Home / Self Care | Payer: Medicaid Other | Source: Ambulatory Visit | Attending: Obstetrics and Gynecology | Admitting: Obstetrics and Gynecology

## 2020-03-07 ENCOUNTER — Ambulatory Visit (INDEPENDENT_AMBULATORY_CARE_PROVIDER_SITE_OTHER): Payer: Medicaid Other

## 2020-03-07 ENCOUNTER — Other Ambulatory Visit: Payer: Self-pay

## 2020-03-07 DIAGNOSIS — R300 Dysuria: Secondary | ICD-10-CM

## 2020-03-07 DIAGNOSIS — Z113 Encounter for screening for infections with a predominantly sexual mode of transmission: Secondary | ICD-10-CM | POA: Insufficient documentation

## 2020-03-07 DIAGNOSIS — N898 Other specified noninflammatory disorders of vagina: Secondary | ICD-10-CM | POA: Diagnosis not present

## 2020-03-07 NOTE — Progress Notes (Signed)
SUBJECTIVE: Annette Wood is a 29 y.o. female who complains of dysuria since yesterday, without flank pain, fever, chills, or bleeding. Pt also c/o malodorous vaginal discharge and requests all STD testing.    OBJECTIVE: Appears well, in no apparent distress. Urine dipstick shows negative for all components.    ASSESSMENT: Dysuria and vaginal discharge  PLAN:  Urine culture, GC/CT/BV/Yeast culture sent to the lab. Will notify patient after the provider reviews results  Treatment per orders.  Call or return to clinic prn if these symptoms worsen or fail to improve as anticipated.

## 2020-03-07 NOTE — Progress Notes (Signed)
Patient was assessed and managed by nursing staff during this encounter. I have reviewed the chart and agree with the documentation and plan. I have also made any necessary editorial changes.  Edelmira Gallogly, MD 03/07/2020 3:34 PM    

## 2020-03-08 LAB — CERVICOVAGINAL ANCILLARY ONLY
Bacterial Vaginitis (gardnerella): POSITIVE — AB
Candida Glabrata: NEGATIVE
Candida Vaginitis: NEGATIVE
Chlamydia: NEGATIVE
Comment: NEGATIVE
Comment: NEGATIVE
Comment: NEGATIVE
Comment: NEGATIVE
Comment: NEGATIVE
Comment: NORMAL
Neisseria Gonorrhea: NEGATIVE
Trichomonas: NEGATIVE

## 2020-03-09 MED ORDER — METRONIDAZOLE 500 MG PO TABS: 500.0000 mg | ORAL_TABLET | Freq: Two times a day (BID) | ORAL | 0 refills | Status: DC

## 2020-03-09 NOTE — Addendum Note (Signed)
Addended by: Catalina Antigua on: 03/09/2020 08:21 AM   Modules accepted: Orders

## 2020-03-10 LAB — URINE CULTURE

## 2020-03-13 MED ORDER — NITROFURANTOIN MONOHYD MACRO 100 MG PO CAPS: 100.0000 mg | ORAL_CAPSULE | Freq: Two times a day (BID) | ORAL | 1 refills | Status: DC

## 2020-03-13 NOTE — Addendum Note (Signed)
Addended by: Catalina Antigua on: 03/13/2020 08:52 AM   Modules accepted: Orders

## 2020-03-22 ENCOUNTER — Encounter: Payer: Self-pay | Admitting: Obstetrics

## 2020-03-22 ENCOUNTER — Telehealth (INDEPENDENT_AMBULATORY_CARE_PROVIDER_SITE_OTHER): Payer: Medicaid Other | Admitting: Obstetrics

## 2020-03-22 DIAGNOSIS — Z3046 Encounter for surveillance of implantable subdermal contraceptive: Secondary | ICD-10-CM

## 2020-03-22 NOTE — Progress Notes (Signed)
   TELEHEALTH GYNECOLOGY VISIT ENCOUNTER NOTE  I connected with TYLAN BRIGUGLIO on 03/22/20 at  3:15 PM EDT by telephone at home and verified that I am speaking with the correct person using two identifiers.   I discussed the limitations, risks, security and privacy concerns of performing an evaluation and management service by telephone and the availability of in person appointments. I also discussed with the patient that there may be a patient responsible charge related to this service. The patient expressed understanding and agreed to proceed.   History:  Annette Wood is a 29 y.o. [phone] female being evaluated today for history of AUB with Nexplanon that has resolved.  She is concerned that she is starting to gain weight. She denies any abnormal vaginal discharge, bleeding, pelvic pain or other concerns.       Past Medical History:  Diagnosis Date  . Chlamydia   . Gallstones   . Gastroesophageal reflux   . Gonorrhea   . Panic attack    Past Surgical History:  Procedure Laterality Date  . CHOLECYSTECTOMY N/A 11/24/2012   Procedure: LAPAROSCOPIC CHOLECYSTECTOMY WITH INTRAOPERATIVE CHOLANGIOGRAM;  Surgeon: Mariella Saa, MD;  Location: [facility];  Service: General;  Laterality: N/A;  . CHOLECYSTECTOMY     The following portions of the patient's history were reviewed and updated as appropriate: allergies, current medications, past family history, past medical history, past social history, past surgical history and problem list.   Health Maintenance:  ASCUS pap and negative HRHPV on 08-05-2017.    Review of Systems:  Pertinent items noted in HPI and remainder of comprehensive ROS otherwise negative.  Physical Exam:   General:  Alert, oriented and cooperative.   Mental Status: Normal mood and affect perceived. Normal judgment and thought content.  Physical exam deferred due to nature of the encounter  Labs and Imaging No results found for this or any previous visit (from the  past 336 hour(s)). No results found.      Assessment and Plan:     1. Encounter for surveillance of Nexplanon subdermal contraceptive - slight weight gain after a few months post insertion - will monitor weight and reassess in 6 months      I discussed the assessment and treatment plan with the patient. The patient was provided an opportunity to ask questions and all were answered. The patient agreed with the plan and demonstrated an understanding of the instructions.   The patient was advised to call back or seek an in-person evaluation/go to the ED if the symptoms worsen or if the condition fails to improve as anticipated.  I provided 15 minutes of non-face-to-face time during this encounter.   Coral Ceo, MD [facility], [facility] 03/22/20

## 2020-06-01 ENCOUNTER — Ambulatory Visit: Payer: Medicaid Other | Admitting: Obstetrics

## 2020-06-08 ENCOUNTER — Other Ambulatory Visit: Payer: Self-pay

## 2020-06-08 ENCOUNTER — Ambulatory Visit (INDEPENDENT_AMBULATORY_CARE_PROVIDER_SITE_OTHER): Payer: Medicaid Other | Admitting: Obstetrics

## 2020-06-08 ENCOUNTER — Encounter: Payer: Self-pay | Admitting: Obstetrics

## 2020-06-08 VITALS — BP 120/83 | HR 78 | Ht 63.0 in | Wt 242.0 lb

## 2020-06-08 DIAGNOSIS — Z3046 Encounter for surveillance of implantable subdermal contraceptive: Secondary | ICD-10-CM

## 2020-06-08 NOTE — Progress Notes (Signed)
NEXPLANON REMOVAL NOTE  Date of LMP:   unknown  Contraception used: *Nexplanon   Indications:  The patient desires removal of Nexplanon.  She understands risks, benefits, and alternatives to Nexplanon and would like to proceed.  Anesthesia:   Lidocaine 1% plain.  Procedure:  A time-out was performed confirming the procedure and the patient's allergy status.  Complications: None                      The rod was palpated and the area was sterilely prepped.  The area beneath the distal tip was anesthetized with 1% xylocaine and the skin incised                       Over the tip and the tip was exposed, grasped with forcep and removed intact.   Steri strip                       And a bandage applied and the arm was wrapped with gauze bandage.  The patient tolerated well.  Instructions:  The patient was instructed to remove the dressing in 24 hours and that some bruising is to be expected.  She was advised to use over the counter analgesics as needed for any pain at the site.  She is to keep the area dry for 24 hours and to call if her hand or arm becomes cold, numb, or blue.  Return visit:  Return in 2 weeks   Brock Bad, MD 06/08/2020 3:15 PM

## 2020-06-08 NOTE — Progress Notes (Signed)
RGYN patient presents for Nexplanon removal. Nexplanon inserted: 09/27/2019  *Pt considering having another baby with boyfriend.

## 2020-06-22 ENCOUNTER — Ambulatory Visit: Payer: Medicaid Other | Admitting: Obstetrics

## 2020-08-02 ENCOUNTER — Ambulatory Visit: Payer: Medicaid Other

## 2020-08-21 ENCOUNTER — Ambulatory Visit: Payer: Medicaid Other | Admitting: Obstetrics

## 2020-10-12 ENCOUNTER — Other Ambulatory Visit: Payer: Self-pay | Admitting: Obstetrics

## 2020-11-06 ENCOUNTER — Ambulatory Visit: Payer: Medicaid Other | Admitting: Obstetrics

## 2020-11-17 ENCOUNTER — Emergency Department (HOSPITAL_COMMUNITY)
Admission: EM | Admit: 2020-11-17 | Discharge: 2020-11-17 | Disposition: A | Discharge status: Home / Self Care | Payer: Medicaid Other | Attending: Emergency Medicine | Admitting: Emergency Medicine

## 2020-11-17 DIAGNOSIS — R109 Unspecified abdominal pain: Secondary | ICD-10-CM | POA: Diagnosis not present

## 2020-11-17 DIAGNOSIS — Z5321 Procedure and treatment not carried out due to patient leaving prior to being seen by health care provider: Secondary | ICD-10-CM | POA: Insufficient documentation

## 2020-11-17 DIAGNOSIS — K625 Hemorrhage of anus and rectum: Secondary | ICD-10-CM | POA: Insufficient documentation

## 2020-11-17 NOTE — ED Notes (Signed)
Called for triage and no answer in lobby

## 2020-11-17 NOTE — ED Notes (Signed)
Called again and unable to locate.

## 2020-11-21 ENCOUNTER — Other Ambulatory Visit (HOSPITAL_COMMUNITY)
Admission: RE | Admit: 2020-11-21 | Discharge: 2020-11-21 | Disposition: A | Discharge status: Home / Self Care | Payer: Medicaid Other | Source: Ambulatory Visit | Attending: Obstetrics | Admitting: Obstetrics

## 2020-11-21 ENCOUNTER — Ambulatory Visit (INDEPENDENT_AMBULATORY_CARE_PROVIDER_SITE_OTHER): Payer: Medicaid Other | Admitting: Obstetrics

## 2020-11-21 ENCOUNTER — Encounter: Payer: Self-pay | Admitting: Obstetrics

## 2020-11-21 ENCOUNTER — Other Ambulatory Visit: Payer: Self-pay

## 2020-11-21 VITALS — BP 130/93 | HR 74 | Ht 63.0 in | Wt 249.8 lb

## 2020-11-21 DIAGNOSIS — N898 Other specified noninflammatory disorders of vagina: Secondary | ICD-10-CM | POA: Diagnosis not present

## 2020-11-21 DIAGNOSIS — Z113 Encounter for screening for infections with a predominantly sexual mode of transmission: Secondary | ICD-10-CM | POA: Insufficient documentation

## 2020-11-21 DIAGNOSIS — N760 Acute vaginitis: Secondary | ICD-10-CM

## 2020-11-21 DIAGNOSIS — Z3009 Encounter for other general counseling and advice on contraception: Secondary | ICD-10-CM

## 2020-11-21 DIAGNOSIS — B9689 Other specified bacterial agents as the cause of diseases classified elsewhere: Secondary | ICD-10-CM | POA: Diagnosis not present

## 2020-11-21 DIAGNOSIS — N946 Dysmenorrhea, unspecified: Secondary | ICD-10-CM

## 2020-11-21 DIAGNOSIS — Z124 Encounter for screening for malignant neoplasm of cervix: Secondary | ICD-10-CM | POA: Diagnosis not present

## 2020-11-21 DIAGNOSIS — Z01419 Encounter for gynecological examination (general) (routine) without abnormal findings: Secondary | ICD-10-CM | POA: Insufficient documentation

## 2020-11-21 MED ORDER — IBUPROFEN 800 MG PO TABS: 800.0000 mg | ORAL_TABLET | Freq: Three times a day (TID) | ORAL | 5 refills | Status: DC | PRN

## 2020-11-21 MED ORDER — METRONIDAZOLE 0.75 % VA GEL: 1.0000 | Freq: Two times a day (BID) | VAGINAL | 5 refills | Status: DC

## 2020-11-21 MED ORDER — VITAFOL ULTRA 29-0.6-0.4-200 MG PO CAPS: 1.0000 | ORAL_CAPSULE | Freq: Every day | ORAL | 11 refills | Status: DC

## 2020-11-21 NOTE — Progress Notes (Signed)
   Patient in clinic for annual exam. Patient denies any vaginal discharge, odor or itching. Desires std screening along with blood work. PAP is due. Currently sexually active, declined birth control.  Clovis Pu, RN

## 2020-11-21 NOTE — Progress Notes (Signed)
Subjective:        Annette Wood is a 30 y.o. female here for a routine exam.  Current complaints: Vaginal discharge.  Denies irritation.  Has fishy vaginal odor after intercourse.  Contraception: none.  Declines contraception.    Personal health questionnaire:  Is patient Ashkenazi Jewish, have a family history of breast and/or ovarian cancer: no Is there a family history of uterine cancer diagnosed at age < 19, gastrointestinal cancer, urinary tract cancer, family member who is a Personnel officer syndrome-associated carrier: no Is the patient overweight and hypertensive, family history of diabetes, personal history of gestational diabetes, preeclampsia or PCOS: no Is patient over 11, have PCOS,  family history of premature CHD under age 72, diabetes, smoke, have hypertension or peripheral artery disease:  no At any time, has a partner hit, kicked or otherwise hurt or frightened you?: no Over the past 2 weeks, have you felt down, depressed or hopeless?: no Over the past 2 weeks, have you felt little interest or pleasure in doing things?:no   Gynecologic History Patient's last menstrual period was 11/15/2020. Contraception: none Last Pap: 08-05-2017. Results were: ASCUS with positive HRHPV Last mammogram: n/a. Results were: n/a  Obstetric History OB History  Gravida Para Term Preterm AB Living  4 3 3     3   SAB IAB Ectopic Multiple Live Births          3    # Outcome Date GA Lbr Len/2nd Weight Sex Delivery Anes PTL Lv  4 Gravida           3 Term 04/01/13 [redacted]w[redacted]d 05:40 / 00:15 7 lb 1.6 oz (3.221 kg) F Vag-Spont EPI  LIV  2 Term 09/20/09 [redacted]w[redacted]d  7 lb 8 oz (3.402 kg) F Vag-Spont EPI N LIV  1 Term 06/09/08 [redacted]w[redacted]d  6 lb 3 oz (2.807 kg) M Vag-Spont None N LIV     Birth Comments: delivered in car    Past Medical History:  Diagnosis Date   Chlamydia    Gallstones    Gastroesophageal reflux    Gonorrhea    Panic attack     Past Surgical History:  Procedure Laterality Date    CHOLECYSTECTOMY N/A 11/24/2012   Procedure: LAPAROSCOPIC CHOLECYSTECTOMY WITH INTRAOPERATIVE CHOLANGIOGRAM;  Surgeon: 01/25/2013, MD;  Location: [facility];  Service: General;  Laterality: N/A;   CHOLECYSTECTOMY      No current outpatient medications on file. No Known Allergies  Social History   Tobacco Use   Smoking status: Never   Smokeless tobacco: Never  Substance Use Topics   Alcohol use: No    Alcohol/week: 0.0 standard drinks    Family History  Problem Relation Age of Onset   Diabetes Maternal Grandmother    Lupus Maternal Grandmother       Review of Systems  Constitutional: negative for fatigue and weight loss Respiratory: negative for cough and wheezing Cardiovascular: negative for chest pain, fatigue and palpitations Gastrointestinal: negative for abdominal pain and change in bowel habits Musculoskeletal:negative for myalgias Neurological: negative for gait problems and tremors Behavioral/Psych: negative for abusive relationship, depression Endocrine: negative for temperature intolerance    Genitourinary:positive for vaginal discharge.  negative for abnormal menstrual periods, genital lesions, hot flashes, sexual problems  Integument/breast: negative for breast lump, breast tenderness, nipple discharge and skin lesion(s)    Objective:       BP (!) 130/93 (BP Location: Right Arm, Patient Position: Sitting, Cuff Size: Large)   Pulse 74   Ht 5'  3" (1.6 m)   Wt 249 lb 12.8 oz (113.3 kg)   LMP 11/15/2020   BMI 44.25 kg/m  General:   Alert and no distress  Skin:   no rash or abnormalities  Lungs:   clear to auscultation bilaterally  Heart:   regular rate and rhythm, S1, S2 normal, no murmur, click, rub or gallop  Breasts:   normal without suspicious masses, skin or nipple changes or axillary nodes  Abdomen:  normal findings: no organomegaly, soft, non-tender and no hernia  Pelvis:  External genitalia: normal general appearance Urinary system: urethral  meatus normal and bladder without fullness, nontender Vaginal: normal without tenderness, induration or masses Cervix: normal appearance Adnexa: normal bimanual exam Uterus: anteverted and non-tender, normal size   Lab Review Urine pregnancy test Labs reviewed yes Radiologic studies reviewed no  I have spent a total of 20 minutes of face-to-face time, excluding clinical staff time, reviewing notes and preparing to see patient, ordering tests and/or medications, and counseling the patient.   Assessment:    1. Encounter for annual routine gynecological examination Rx: - Cytology - PAP( [place]) - Prenat-Fe Poly-Methfol-FA-DHA (VITAFOL ULTRA) 29-0.6-0.4-200 MG CAPS; Take 1 capsule by mouth daily before breakfast.  Dispense: 30 capsule; Refill: 11  2. Vaginal discharge Rx: - Cervicovaginal ancillary only( [place])  3. Screen for STD (sexually transmitted disease) Rx: - RPR+HBsAg+HCVAb+...  4. BV (bacterial vaginosis) Rx: - metroNIDAZOLE (METROGEL VAGINAL) 0.75 % vaginal gel; Place 1 Applicatorful vaginally 2 (two) times daily.  Dispense: 70 g; Refill: 5  5. Dysmenorrhea Rx: - ibuprofen (ADVIL) 800 MG tablet; Take 1 tablet (800 mg total) by mouth every 8 (eight) hours as needed.  Dispense: 30 tablet; Refill: 5  6. Encounter for other general counseling and advice on contraception - options discussed.  All questions answered. - declines contraception     Plan:    Education reviewed: calcium supplements, depression evaluation, low fat, low cholesterol diet, safe sex/STD prevention, self breast exams, and weight bearing exercise. Contraception: none. Follow up in: 1 year.    Orders Placed This Encounter  Procedures   RPR+HBsAg+HCVAb+...      Brock Bad, MD 11/21/2020 1:27 PM

## 2020-11-22 ENCOUNTER — Telehealth: Payer: Self-pay | Admitting: *Deleted

## 2020-11-22 LAB — CERVICOVAGINAL ANCILLARY ONLY
Bacterial Vaginitis (gardnerella): POSITIVE — AB
Candida Glabrata: NEGATIVE
Candida Vaginitis: NEGATIVE
Chlamydia: NEGATIVE
Comment: NEGATIVE
Comment: NEGATIVE
Comment: NEGATIVE
Comment: NEGATIVE
Comment: NEGATIVE
Comment: NORMAL
Neisseria Gonorrhea: NEGATIVE
Trichomonas: NEGATIVE

## 2020-11-22 LAB — RPR+HBSAG+HCVAB+...
HIV Screen 4th Generation wRfx: NONREACTIVE
Hep C Virus Ab: <0.1 s/co ratio (ref 0.0–0.9)
Hepatitis B Surface Ag: NEGATIVE
RPR Ser Ql: NONREACTIVE

## 2020-11-22 NOTE — Telephone Encounter (Signed)
Pt called to office for results.  Results reviewed with pt and questions answered.  Treatment was sent by provider at time of visit.  Pt made aware if Pap abnormal she will receive call as it is not yet resulted.

## 2020-11-23 ENCOUNTER — Other Ambulatory Visit: Payer: Self-pay | Admitting: Obstetrics

## 2020-11-24 LAB — CYTOLOGY - PAP: Diagnosis: NEGATIVE

## 2021-03-06 ENCOUNTER — Ambulatory Visit: Payer: Medicaid Other

## 2021-04-22 ENCOUNTER — Other Ambulatory Visit: Payer: Self-pay | Admitting: Obstetrics and Gynecology

## 2021-05-03 ENCOUNTER — Ambulatory Visit: Payer: Medicaid Other

## 2021-05-04 ENCOUNTER — Ambulatory Visit: Payer: Medicaid Other

## 2021-06-11 ENCOUNTER — Ambulatory Visit (INDEPENDENT_AMBULATORY_CARE_PROVIDER_SITE_OTHER): Payer: Medicaid Other

## 2021-06-11 ENCOUNTER — Other Ambulatory Visit (HOSPITAL_COMMUNITY)
Admission: RE | Admit: 2021-06-11 | Discharge: 2021-06-11 | Disposition: A | Discharge status: Home / Self Care | Payer: Medicaid Other | Source: Ambulatory Visit | Attending: Obstetrics and Gynecology | Admitting: Obstetrics and Gynecology

## 2021-06-11 ENCOUNTER — Other Ambulatory Visit: Payer: Self-pay

## 2021-06-11 VITALS — BP 132/92 | HR 73

## 2021-06-11 DIAGNOSIS — N898 Other specified noninflammatory disorders of vagina: Secondary | ICD-10-CM | POA: Insufficient documentation

## 2021-06-11 DIAGNOSIS — N926 Irregular menstruation, unspecified: Secondary | ICD-10-CM | POA: Diagnosis not present

## 2021-06-11 LAB — POCT URINE PREGNANCY: Preg Test, Ur: NEGATIVE

## 2021-06-11 NOTE — Progress Notes (Signed)
SUBJECTIVE:  31 y.o. female complains of a brownish vaginal discharge for 1 week(s). She denies having any odor, irritation, or itching Denies abnormal vaginal bleeding or significant pelvic pain or fever. No UTI symptoms. Denies history of known exposure to STD.  No LMP recorded.  OBJECTIVE:  She appears well, afebrile. Urine dipstick: not done.  ASSESSMENT:  Vaginal Discharge  Vaginal Odor   PLAN:  GC, chlamydia, trichomonas, BVAG, CVAG probe sent to lab. Treatment: To be determined once lab results are received ROV prn if symptoms persist or worsen.   Patient also request have a UPT due to her cycle lasting only 2 days this month.

## 2021-06-11 NOTE — Progress Notes (Signed)
Patient was assessed and managed by nursing staff during this encounter. I have reviewed the chart and agree with the documentation and plan. I have also made any necessary editorial changes.  Catalina Antigua, MD 06/11/2021 12:54 PM

## 2021-06-12 LAB — CERVICOVAGINAL ANCILLARY ONLY
Bacterial Vaginitis (gardnerella): POSITIVE — AB
Candida Glabrata: NEGATIVE
Candida Vaginitis: NEGATIVE
Chlamydia: NEGATIVE
Comment: NEGATIVE
Comment: NEGATIVE
Comment: NEGATIVE
Comment: NEGATIVE
Comment: NEGATIVE
Comment: NORMAL
Neisseria Gonorrhea: NEGATIVE
Trichomonas: NEGATIVE

## 2021-06-12 MED ORDER — METRONIDAZOLE 500 MG PO TABS: 500.0000 mg | ORAL_TABLET | Freq: Two times a day (BID) | ORAL | 0 refills | Status: DC

## 2021-06-12 NOTE — Addendum Note (Signed)
Addended by: Catalina Antigua on: 06/12/2021 04:59 PM   Modules accepted: Orders

## 2021-08-07 ENCOUNTER — Other Ambulatory Visit: Payer: Self-pay

## 2021-08-07 ENCOUNTER — Ambulatory Visit (INDEPENDENT_AMBULATORY_CARE_PROVIDER_SITE_OTHER): Payer: Medicaid Other | Admitting: Emergency Medicine

## 2021-08-07 VITALS — BP 139/86 | HR 94 | Wt 257.2 lb

## 2021-08-07 DIAGNOSIS — Z3201 Encounter for pregnancy test, result positive: Secondary | ICD-10-CM | POA: Diagnosis not present

## 2021-08-07 DIAGNOSIS — Z3200 Encounter for pregnancy test, result unknown: Secondary | ICD-10-CM

## 2021-08-07 LAB — POCT URINE PREGNANCY: Preg Test, Ur: POSITIVE — AB

## 2021-08-07 MED ORDER — VITAFOL ULTRA 29-0.6-0.4-200 MG PO CAPS: 1.0000 | ORAL_CAPSULE | Freq: Every day | ORAL | 11 refills | Status: DC

## 2021-08-07 NOTE — Progress Notes (Signed)
Annette Wood presents today for UPT. She has no unusual complaints. ?LMP: ?   ?OBJECTIVE: Appears well, in no apparent distress.  ?OB History   ? ? Gravida  ?5  ? Para  ?3  ? Term  ?3  ? Preterm  ?   ? AB  ?   ? Living  ?3  ?  ? ? SAB  ?   ? IAB  ?   ? Ectopic  ?   ? Multiple  ?   ? Live Births  ?3  ?   ?  ?  ? ?Home UPT Result: NONE ?In-Office UPT result: POSITIVE ?I have reviewed the patient's medical, obstetrical, social, and family histories, and medications.  ? ?ASSESSMENT: Positive pregnancy test ? ?PLAN ?Prenatal care to be completed at: [facility] ?Prenatal vitamin Rx sent to pharmacy.  ?  ?

## 2021-08-13 ENCOUNTER — Inpatient Hospital Stay (HOSPITAL_COMMUNITY): Payer: Medicaid Other

## 2021-08-13 ENCOUNTER — Other Ambulatory Visit: Payer: Self-pay

## 2021-08-13 ENCOUNTER — Inpatient Hospital Stay (HOSPITAL_COMMUNITY)
Admission: AD | Admit: 2021-08-13 | Discharge: 2021-08-13 | Disposition: A | Discharge status: Home / Self Care | Payer: Medicaid Other | Attending: Family Medicine | Admitting: Family Medicine

## 2021-08-13 ENCOUNTER — Encounter (HOSPITAL_COMMUNITY): Payer: Self-pay | Admitting: Obstetrics and Gynecology

## 2021-08-13 ENCOUNTER — Telehealth: Payer: Self-pay

## 2021-08-13 DIAGNOSIS — Z3A01 Less than 8 weeks gestation of pregnancy: Secondary | ICD-10-CM | POA: Diagnosis not present

## 2021-08-13 DIAGNOSIS — O26891 Other specified pregnancy related conditions, first trimester: Secondary | ICD-10-CM | POA: Diagnosis not present

## 2021-08-13 DIAGNOSIS — Z791 Long term (current) use of non-steroidal anti-inflammatories (NSAID): Secondary | ICD-10-CM | POA: Insufficient documentation

## 2021-08-13 DIAGNOSIS — O208 Other hemorrhage in early pregnancy: Secondary | ICD-10-CM | POA: Diagnosis not present

## 2021-08-13 DIAGNOSIS — R102 Pelvic and perineal pain: Secondary | ICD-10-CM | POA: Insufficient documentation

## 2021-08-13 LAB — WET PREP, GENITAL
Clue Cells Wet Prep HPF POC: NONE SEEN
Sperm: NONE SEEN
Trich, Wet Prep: NONE SEEN
WBC, Wet Prep HPF POC: <10 (ref <10)
Yeast Wet Prep HPF POC: NONE SEEN

## 2021-08-13 LAB — URINALYSIS, ROUTINE W REFLEX MICROSCOPIC
Bilirubin Urine: NEGATIVE
Glucose, UA: NEGATIVE mg/dL
Hgb urine dipstick: NEGATIVE
Ketones, ur: NEGATIVE mg/dL
Leukocytes,Ua: NEGATIVE
Nitrite: NEGATIVE
Protein, ur: NEGATIVE mg/dL
Specific Gravity, Urine: 1.023 (ref 1.005–1.030)
pH: 6 (ref 5.0–8.0)

## 2021-08-13 LAB — CBC
HCT: 35.8 % — ABNORMAL LOW (ref 36.0–46.0)
Hemoglobin: 12.1 g/dL (ref 12.0–15.0)
MCH: 30.2 pg (ref 26.0–34.0)
MCHC: 33.8 g/dL (ref 30.0–36.0)
MCV: 89.3 fL (ref 80.0–100.0)
Platelets: 312 10*3/uL (ref 150–400)
RBC: 4.01 MIL/uL (ref 3.87–5.11)
RDW: 13.1 % (ref 11.5–15.5)
WBC: 7.5 10*3/uL (ref 4.0–10.5)
nRBC: 0 % (ref 0.0–0.2)

## 2021-08-13 LAB — HIV ANTIBODY (ROUTINE TESTING W REFLEX): HIV Screen 4th Generation wRfx: NONREACTIVE

## 2021-08-13 LAB — HCG, QUANTITATIVE, PREGNANCY: hCG, Beta Chain, Quant, S: 14744 m[IU]/mL — ABNORMAL HIGH (ref <5)

## 2021-08-13 NOTE — MAU Note (Signed)
...  Annette Wood is a 31 y.o. at [redacted]w[redacted]d here in MAU reporting: Intermittent lower abdominal pain that began in the middle of the night last night. She states when she used the restroom this morning she noticed light pink spotting but has not seen the spotting since. She states she has been experiencing vaginal odor for 2-3 days with an increase in clear discharge. Has not had an ultrasound in this pregnancy. ? ?First OB appointment scheduled April 24th with [facility]. ? ?Pain score: 9/10 intermittent ? ?Lab orders placed from triage:  UA ? ?

## 2021-08-13 NOTE — MAU Provider Note (Signed)
?History  ?  ? ?CSN: [id number] ? ?Arrival date and time: 08/13/21 0913 ? ? Event Date/Time  ? First Provider Initiated Contact with Patient 08/13/21 0957   ?  ? ?Chief Complaint  ?Patient presents with  ? Abdominal Pain  ? ?HPI ?This is a 31 year old G5 P3-0-1-3 at 6 weeks and 6 days by LMP who presents with pelvic pain that started a couple days ago.  Her pain is intermittent, non-radiating. Had some spotting yesterday, but none now. She spoke with her doctor's office, who told her to come here. No palliating or provoking factors. Denies fevers, chills, nausea, vomiting. ? ?OB History   ? ? Gravida  ?5  ? Para  ?3  ? Term  ?3  ? Preterm  ?0  ? AB  ?1  ? Living  ?3  ?  ? ? SAB  ?0  ? IAB  ?1  ? Ectopic  ?0  ? Multiple  ?0  ? Live Births  ?3  ?   ?  ?  ? ? ?Past Medical History:  ?Diagnosis Date  ? Chlamydia   ? Gallstones   ? Gastroesophageal reflux   ? Gonorrhea   ? Panic attack   ? ? ?Past Surgical History:  ?Procedure Laterality Date  ? CHOLECYSTECTOMY N/A 11/24/2012  ? Procedure: LAPAROSCOPIC CHOLECYSTECTOMY WITH INTRAOPERATIVE CHOLANGIOGRAM;  Surgeon: Edward Jolly, MD;  Location: [facility];  Service: General;  Laterality: N/A;  ? CHOLECYSTECTOMY    ? ? ?Family History  ?Problem Relation Age of Onset  ? Diabetes Maternal Grandmother   ? Lupus Maternal Grandmother   ? ? ?Social History  ? ?Tobacco Use  ? Smoking status: Never  ? Smokeless tobacco: Never  ?Vaping Use  ? Vaping Use: Never used  ?Substance Use Topics  ? Alcohol use: Not Currently  ? Drug use: No  ? ? ?Allergies: No Known Allergies ? ?Medications Prior to Admission  ?Medication Sig Dispense Refill Last Dose  ? ibuprofen (ADVIL) 800 MG tablet Take 1 tablet (800 mg total) by mouth every 8 (eight) hours as needed. (Patient not taking: Reported on 08/07/2021) 30 tablet 5   ? metroNIDAZOLE (FLAGYL) 500 MG tablet Take 1 tablet (500 mg total) by mouth 2 (two) times daily. (Patient not taking: Reported on 08/07/2021) 14 tablet 0   ? metroNIDAZOLE (METROGEL  VAGINAL) 0.75 % vaginal gel Place 1 Applicatorful vaginally 2 (two) times daily. (Patient not taking: Reported on 08/07/2021) 70 g 5   ? Prenat-Fe Poly-Methfol-FA-DHA (VITAFOL ULTRA) 29-0.6-0.4-200 MG CAPS Take 1 capsule by mouth daily before breakfast. (Patient not taking: Reported on 08/07/2021) 30 capsule 11   ? Prenat-Fe Poly-Methfol-FA-DHA (VITAFOL ULTRA) 29-0.6-0.4-200 MG CAPS Take 1 tablet by mouth daily. 30 capsule 11   ? ? ?Review of Systems ?Physical Exam  ? ?Blood pressure 127/75, pulse 92, temperature 97.9 ?F (36.6 ?C), temperature source Oral, resp. rate 17, height 5\' 3"  (1.6 m), weight 114.9 kg, last menstrual period 06/26/2021, SpO2 100 %. ? ?Physical Exam ?Vitals and nursing note reviewed. Exam conducted with a chaperone present.  ?Constitutional:   ?   Appearance: She is well-developed.  ?Abdominal:  ?   Tenderness: There is abdominal tenderness in the right lower quadrant, suprapubic area and left lower quadrant. There is no guarding or rebound.  ?Skin: ?   General: Skin is warm and dry.  ?   Capillary Refill: Capillary refill takes less than 2 seconds.  ?Neurological:  ?   General: No focal deficit present.  ?  Mental Status: She is alert.  ? ?Results for orders placed or performed during the hospital encounter of 08/13/21 (from the past 24 hour(s))  ?Urinalysis, Routine w reflex microscopic Urine, Clean Catch     Status: None  ? Collection Time: 08/13/21 10:00 AM  ?Result Value Ref Range  ? Color, Urine YELLOW YELLOW  ? APPearance CLEAR CLEAR  ? Specific Gravity, Urine 1.023 1.005 - 1.030  ? pH 6.0 5.0 - 8.0  ? Glucose, UA NEGATIVE NEGATIVE mg/dL  ? Hgb urine dipstick NEGATIVE NEGATIVE  ? Bilirubin Urine NEGATIVE NEGATIVE  ? Ketones, ur NEGATIVE NEGATIVE mg/dL  ? Protein, ur NEGATIVE NEGATIVE mg/dL  ? Nitrite NEGATIVE NEGATIVE  ? Leukocytes,Ua NEGATIVE NEGATIVE  ?CBC     Status: Abnormal  ? Collection Time: 08/13/21 10:05 AM  ?Result Value Ref Range  ? WBC 7.5 4.0 - 10.5 K/uL  ? RBC 4.01 3.87 -  5.11 MIL/uL  ? Hemoglobin 12.1 12.0 - 15.0 g/dL  ? HCT 35.8 (L) 36.0 - 46.0 %  ? MCV 89.3 80.0 - 100.0 fL  ? MCH 30.2 26.0 - 34.0 pg  ? MCHC 33.8 30.0 - 36.0 g/dL  ? RDW 13.1 11.5 - 15.5 %  ? Platelets 312 150 - 400 K/uL  ? nRBC 0.0 0.0 - 0.2 %  ? ?US OB LESS THAN 14 WEEKS WITH OB TRANSVAGINAL ? ?Result Date: 08/13/2021 ?CLINICAL DATA:  Pelvic pain.  Spotting. EXAM: OBSTETRIC <14 WK [place] AND TRANSVAGINAL OB US TECHNIQUE: Both transabdominal and transvaginal ultrasound examinations were performed for complete evaluation of the gestation as well as the maternal uterus, adnexal regions, and pelvic cul-de-sac. Transvaginal technique was performed to assess early pregnancy. COMPARISON:  Ultrasound November 12, 2012. FINDINGS: Incomplete exam.  Transvaginal exam ended early due to patient pain. Intrauterine gestational sac: Single Yolk sac:  Visualized. Embryo:  Visualized. Cardiac Activity: Visualized. Heart Rate: 116 bpm CRL:  5.0 mm   6 w   1 d                  [place] EDC: 04/07/2022 Subchorionic hemorrhage:  Small subchorionic hemorrhage. Maternal uterus/adnexae: Unremarkable right adnexa. Left ovary is not visualized. IMPRESSION: 1. Single intrauterine gestation with small subchorionic hemorrhage, as detailed above. 2. Incomplete exam due to patient intolerance/pain. Electronically Signed   By: Margaretha Sheffield M.D.   On: 08/13/2021 10:52   ? ? ?MAU Course  ?Procedures ? ?MDM ?Imaging: I independent reviewed the images of the [place]. IUP with fetal pole measuring [redacted]w[redacted]d. Yolk sac visible. FHT 116. Ovaries appear normal. ? ?Assessment and Plan  ? ?1. [redacted] weeks gestation of pregnancy   ?2. Pelvic pain   ? ?Discharge to home. Return precautions given. ? ?Truett Mainland ?08/13/2021, 11:02 AM  ?

## 2021-08-13 NOTE — MAU Note (Signed)
Main Lab notified RN that she was unable to locate the sample. She states the tubing station is down and none of the tubes are dropping. Sample officially timed out. Will need to recollect. DO made aware. ?

## 2021-08-13 NOTE — Telephone Encounter (Signed)
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 She c/o bilateral low abdominal cramping 3/10, denies VB, denies N&V at this time.  ?Patient has not tried pain relievers.  ?She is drinking "enough" water.  ?She is not working at this time. ?Pt agrees to take OTC Tylenol, drink at least 96 oz water daily, and avoid strenuous activities.  ?Reassured pt she may experience cramping/pain as the womb is getting prepared for her growing baby.  ?Contact the office or report to MAU after hours if no relief with Tylenol or if sx's worsen.  ? ?

## 2021-08-13 NOTE — MAU Note (Signed)
Called Kim in [facility] requesting the patient's Wet Prep be processed before it times out as it was sent at 1005. Lab looking for the sample now. ?

## 2021-08-14 ENCOUNTER — Telehealth: Payer: Self-pay | Admitting: *Deleted

## 2021-08-14 LAB — GC/CHLAMYDIA PROBE AMP (~~LOC~~) NOT AT ARMC
Chlamydia: NEGATIVE
Comment: NEGATIVE
Comment: NORMAL
Neisseria Gonorrhea: NEGATIVE

## 2021-08-14 NOTE — Telephone Encounter (Signed)
TC from patient regarding results from MAU [place] and labs. Pt has seen in MyChart but needs help understanding. Explanation provided. Pt reassured that labs are WNL and US shows viable IUP at this point. EDC 04/02/22 by LMP on chart. Varies by 5 days from [place] report of [redacted]w[redacted]d yesterday. Pt concerned about paternity. Dating explained. Advised unable to determine exact date of conception on approx timeframe. Pt verbalized understanding. ?

## 2021-08-31 ENCOUNTER — Other Ambulatory Visit: Payer: Self-pay

## 2021-08-31 ENCOUNTER — Inpatient Hospital Stay (HOSPITAL_COMMUNITY)
Admission: AD | Admit: 2021-08-31 | Discharge: 2021-08-31 | Disposition: A | Discharge status: Home / Self Care | Payer: Medicaid Other | Attending: Obstetrics & Gynecology | Admitting: Obstetrics & Gynecology

## 2021-08-31 ENCOUNTER — Inpatient Hospital Stay (EMERGENCY_DEPARTMENT_HOSPITAL)
Admission: AD | Admit: 2021-08-31 | Discharge: 2021-09-01 | Disposition: A | Discharge status: Home / Self Care | Payer: Medicaid Other | Source: Home / Self Care | Attending: Obstetrics and Gynecology | Admitting: Obstetrics and Gynecology

## 2021-08-31 DIAGNOSIS — Z3A09 9 weeks gestation of pregnancy: Secondary | ICD-10-CM | POA: Diagnosis not present

## 2021-08-31 DIAGNOSIS — O021 Missed abortion: Secondary | ICD-10-CM | POA: Diagnosis not present

## 2021-08-31 DIAGNOSIS — O418X10 Other specified disorders of amniotic fluid and membranes, first trimester, not applicable or unspecified: Secondary | ICD-10-CM

## 2021-08-31 DIAGNOSIS — O208 Other hemorrhage in early pregnancy: Secondary | ICD-10-CM | POA: Insufficient documentation

## 2021-08-31 DIAGNOSIS — Z3689 Encounter for other specified antenatal screening: Secondary | ICD-10-CM | POA: Diagnosis not present

## 2021-08-31 DIAGNOSIS — N939 Abnormal uterine and vaginal bleeding, unspecified: Secondary | ICD-10-CM

## 2021-08-31 DIAGNOSIS — O468X1 Other antepartum hemorrhage, first trimester: Secondary | ICD-10-CM

## 2021-08-31 DIAGNOSIS — Z3A08 8 weeks gestation of pregnancy: Secondary | ICD-10-CM | POA: Diagnosis not present

## 2021-08-31 NOTE — MAU Note (Addendum)
.  Annette Wood is a 31 y.o. at [redacted]w[redacted]d here in MAU reporting: she was seen earlier today in MAU for bleeding. Had u/s but not a vaginal u/s. States when she got home she passed a blood clot. Reports her doctor called to check on her and when she mentioned the blood clot she was told to return to hospital and ask for vaginal u/s. Has used one pad today and clot was size of nickel and dark red ?LMP: 06/26/21 ?Onset of complaint: today ?Pain score: 0 ?Vitals:  ? 08/31/21 2142  ?BP: 131/79  ?Pulse: 81  ?Resp: 17  ?Temp: 98.1 ?F (36.7 ?C)  ?SpO2: 100%  ?   ?FHT:n/a ?Lab orders placed from triage:none   ? ?

## 2021-08-31 NOTE — MAU Note (Signed)
Annette Wood is a 31 y.o. at [redacted]w[redacted]d here in MAU reporting: saw blood when she went to the bathroom this morning. Denies any pain. (Hx of subchorionic hemorrhage noted on first [place]- pt was unaware) ? ?Onset of complaint: this morning ?Pain score: none ?Vitals:  ? 08/31/21 1027  ?Pulse: 90  ?Resp: 18  ?Temp: 98.5 ?F (36.9 ?C)  ?SpO2: 98%  ?   ?Lab orders placed from triage:  none ?

## 2021-08-31 NOTE — MAU Provider Note (Signed)
Event Date/Time  ? First Provider Initiated Contact with Patient 08/31/21 1050   ?  ?S ?[name] is a 31 y.o. [phone] pregnant female at [redacted]w[redacted]d who presents to MAU today with complaint of light pink vaginal discharge once this morning. She saw it on her toilet paper when wiping so came in. Was seen in MAU on 08/13/21 for vaginal bleeding, patient does not remember being told anything other than her pregnancy was fine. No other physical complaints noted. ? ?Receives OB/GYN care from [facility]. Review of U/S report from 08/13/21 confirms presence of [facility], does not appear she was given bleeding precautions. ? ?O ?Pulse 90   Temp 98.5 ?F (36.9 ?C) (Oral)   Resp 18   Ht 5\' 3"  (1.6 m)   Wt 248 lb 1.6 oz (112.5 kg)   LMP 06/26/2021   SpO2 98%   BMI 43.95 kg/m?  ?Physical Exam ?Vitals and nursing note reviewed.  ?Constitutional:   ?   Appearance: Normal appearance.  ?HENT:  ?   Head: Normocephalic and atraumatic.  ?Eyes:  ?   Pupils: Pupils are equal, round, and reactive to light.  ?Cardiovascular:  ?   Rate and Rhythm: Normal rate and regular rhythm.  ?Pulmonary:  ?   Effort: Pulmonary effort is normal.  ?Abdominal:  ?   Palpations: Abdomen is soft.  ?   Tenderness: There is no abdominal tenderness.  ?Genitourinary: ?   Comments: Positive cardiac activity noted, could not ascertain rate ?Musculoskeletal:     ?   General: Normal range of motion.  ?Skin: ?   General: Skin is warm and dry.  ?   Capillary Refill: Capillary refill takes less than 2 seconds.  ?Neurological:  ?   Mental Status: She is alert and oriented to person, place, and time.  ?Psychiatric:     ?   Mood and Affect: Mood normal.     ?   Behavior: Behavior normal.     ?   Thought Content: Thought content normal.     ?   Judgment: Judgment normal.  ? ?MDM/MAU Course: ?Explained that [facility] can cause vaginal bleeding/cramping and when to be concerned. Pt reassured by bedside ultrasound and stable for discharge since no additional bleeding noted since  morning void. ? ?A ?Subchorionic hematoma ?9 weeks of gestation ?Medical screening exam complete ? ?P ?Discharge from MAU in stable condition with bleeding precautions ?Follow up at [facility] for ongoing prenatal care ? ?[facility], CNM ?08/31/2021 10:54 AM  ? ?

## 2021-09-01 ENCOUNTER — Inpatient Hospital Stay (HOSPITAL_COMMUNITY): Payer: Medicaid Other

## 2021-09-01 DIAGNOSIS — Z3A08 8 weeks gestation of pregnancy: Secondary | ICD-10-CM | POA: Diagnosis not present

## 2021-09-01 DIAGNOSIS — Z3689 Encounter for other specified antenatal screening: Secondary | ICD-10-CM | POA: Diagnosis not present

## 2021-09-01 DIAGNOSIS — O021 Missed abortion: Secondary | ICD-10-CM | POA: Diagnosis not present

## 2021-09-01 LAB — CBC
HCT: 35.9 % — ABNORMAL LOW (ref 36.0–46.0)
Hemoglobin: 12.1 g/dL (ref 12.0–15.0)
MCH: 29.8 pg (ref 26.0–34.0)
MCHC: 33.7 g/dL (ref 30.0–36.0)
MCV: 88.4 fL (ref 80.0–100.0)
Platelets: 308 10*3/uL (ref 150–400)
RBC: 4.06 MIL/uL (ref 3.87–5.11)
RDW: 12.7 % (ref 11.5–15.5)
WBC: 7.6 10*3/uL (ref 4.0–10.5)
nRBC: 0 % (ref 0.0–0.2)

## 2021-09-01 LAB — HCG, QUANTITATIVE, PREGNANCY: hCG, Beta Chain, Quant, S: 23394 m[IU]/mL — ABNORMAL HIGH (ref <5)

## 2021-09-01 NOTE — MAU Note (Addendum)
Sharen Counter CNM in [facility] to discuss test results with pt and plan of care. Pt then d/c home by provider ?

## 2021-09-01 NOTE — MAU Provider Note (Signed)
Chief Complaint: Vaginal Bleeding ? ? None  ?  ? ?SUBJECTIVE ?HPI: Annette Wood is a 31 y.o. [id number] at 9973w4d  who presents to maternity admissions reporting increased vaginal bleeding with clots since her MAU visit on 08/31/21. ? ?HPI ? ?Past Medical History:  ?Diagnosis Date  ? Chlamydia   ? Gallstones   ? Gastroesophageal reflux   ? Gonorrhea   ? Panic attack   ? ?Past Surgical History:  ?Procedure Laterality Date  ? CHOLECYSTECTOMY N/A 11/24/2012  ? Procedure: LAPAROSCOPIC CHOLECYSTECTOMY WITH INTRAOPERATIVE CHOLANGIOGRAM;  Surgeon: Mariella SaaBenjamin T Hoxworth, MD;  Location: [facility];  Service: General;  Laterality: N/A;  ? CHOLECYSTECTOMY    ? ?Social History  ? ?Socioeconomic History  ? Marital status: Single  ?  Spouse name: Not on file  ? Number of children: Not on file  ? Years of education: Not on file  ? Highest education level: Not on file  ?Occupational History  ? Not on file  ?Tobacco Use  ? Smoking status: Never  ? Smokeless tobacco: Never  ?Vaping Use  ? Vaping Use: Never used  ?Substance and Sexual Activity  ? Alcohol use: Not Currently  ? Drug use: No  ? Sexual activity: Yes  ?  Partners: Male  ?  Birth control/protection: None  ?Other Topics Concern  ? Not on file  ?Social History Narrative  ? Not on file  ? ?Social Determinants of Health  ? ?Financial Resource Strain: Not on file  ?Food Insecurity: Not on file  ?Transportation Needs: Not on file  ?Physical Activity: Not on file  ?Stress: Not on file  ?Social Connections: Not on file  ?Intimate Partner Violence: Not on file  ? ?No current facility-administered medications on file prior to encounter.  ? ?Current Outpatient Medications on File Prior to Encounter  ?Medication Sig Dispense Refill  ? Prenat-Fe Poly-Methfol-FA-DHA (VITAFOL ULTRA) 29-0.6-0.4-200 MG CAPS Take 1 tablet by mouth daily. 30 capsule 11  ? ?No Known Allergies ? ?ROS:  ?Review of Systems  ?Constitutional:  Negative for chills, fatigue and fever.  ?Respiratory:  Negative for shortness of  breath.   ?Cardiovascular:  Negative for chest pain.  ?Gastrointestinal:  Positive for abdominal pain.  ?Genitourinary:  Positive for vaginal bleeding. Negative for difficulty urinating, dysuria, flank pain, pelvic pain, vaginal discharge and vaginal pain.  ?Neurological:  Negative for dizziness and headaches.  ?Psychiatric/Behavioral: Negative.    ? ? ?I have reviewed patient's Past Medical Hx, Surgical Hx, Family Hx, Social Hx, medications and allergies.  ? ?Physical Exam  ?Patient Vitals for the past 24 hrs: ? BP Temp Pulse Resp SpO2 Height Weight  ?08/31/21 2142 131/79 98.1 ?F (36.7 ?C) 81 17 100 % 5\' 3"  (1.6 m) 114.3 kg  ? ?Constitutional: Well-developed, well-nourished female in no acute distress.  ?Cardiovascular: normal rate ?Respiratory: normal effort ?GI: Abd soft, non-tender. Pos BS x 4 ?MS: Extremities nontender, no edema, normal ROM ?Neurologic: Alert and oriented x 4.  ?GU: Neg CVAT. ? ? ?LAB RESULTS ?Results for orders placed or performed during the hospital encounter of 08/31/21 (from the past 24 hour(s))  ?CBC     Status: Abnormal  ? Collection Time: 08/31/21 11:59 PM  ?Result Value Ref Range  ? WBC 7.6 4.0 - 10.5 K/uL  ? RBC 4.06 3.87 - 5.11 MIL/uL  ? Hemoglobin 12.1 12.0 - 15.0 g/dL  ? HCT 35.9 (L) 36.0 - 46.0 %  ? MCV 88.4 80.0 - 100.0 fL  ? MCH 29.8 26.0 - 34.0  pg  ? MCHC 33.7 30.0 - 36.0 g/dL  ? RDW 12.7 11.5 - 15.5 %  ? Platelets 308 150 - 400 K/uL  ? nRBC 0.0 0.0 - 0.2 %  ?hCG, quantitative, pregnancy     Status: Abnormal  ? Collection Time: 08/31/21 11:59 PM  ?Result Value Ref Range  ? hCG, Beta Chain, Quant, S 23,394 (H) <5 mIU/mL  ? ? ?  ? ?IMAGING ?US OB Transvaginal ? ?Result Date: 09/01/2021 ?CLINICAL DATA:  Passing blood clots. EXAM: TRANSVAGINAL OB ULTRASOUND TECHNIQUE: Transvaginal ultrasound was performed for complete evaluation of the gestation as well as the maternal uterus, adnexal regions, and pelvic cul-de-sac. COMPARISON:  Obstetrical ultrasound 08/13/2021 FINDINGS:  Intrauterine gestational sac: Single Yolk sac:  Visualized.  Enlarged. Embryo:  Visualized. Cardiac Activity: Not Visualized. Heart Rate: 0 bpm CRL: 18.1 mm 8 w 2 d [place] EDC: 04/11/2009 increased Subchorionic hemorrhage:  None visualized. Maternal uterus/adnexae: Ovaries are within normal limits. No free fluid identified. IMPRESSION: 1. Intrauterine fetal demise measuring 8 weeks 2 days by crown-rump length. Electronically Signed   By: Darliss Cheney M.D.   On: 09/01/2021 00:26  ? ?US OB LESS THAN 14 WEEKS WITH OB TRANSVAGINAL ? ?Result Date: 08/13/2021 ?CLINICAL DATA:  Pelvic pain.  Spotting. EXAM: OBSTETRIC <14 WK [place] AND TRANSVAGINAL OB US TECHNIQUE: Both transabdominal and transvaginal ultrasound examinations were performed for complete evaluation of the gestation as well as the maternal uterus, adnexal regions, and pelvic cul-de-sac. Transvaginal technique was performed to assess early pregnancy. COMPARISON:  Ultrasound November 12, 2012. FINDINGS: Incomplete exam.  Transvaginal exam ended early due to patient pain. Intrauterine gestational sac: Single Yolk sac:  Visualized. Embryo:  Visualized. Cardiac Activity: Visualized. Heart Rate: 116 bpm CRL:  5.0 mm   6 w   1 d                  [place] EDC: 04/07/2022 Subchorionic hemorrhage:  Small subchorionic hemorrhage. Maternal uterus/adnexae: Unremarkable right adnexa. Left ovary is not visualized. IMPRESSION: 1. Single intrauterine gestation with small subchorionic hemorrhage, as detailed above. 2. Incomplete exam due to patient intolerance/pain. Electronically Signed   By: Feliberto Harts M.D.   On: 08/13/2021 10:52   ? ?MAU Management/MDM: ?Orders Placed This Encounter  ?Procedures  ? US OB Transvaginal  ? CBC  ? hCG, quantitative, pregnancy  ? Discharge patient  ?  ?No orders of the defined types were placed in this encounter. ?  ?US shows missed ab at [redacted]w[redacted]d. Discussed options with patient including expectant management, Cytotec Rx, or D&C scheduled in 2-3 days to 1 week.   Benefits/risks discussed for all options.  All options include close follow up in the office.  Pt chooses D&C.  Consult Dr Donavan Foil, messages sent to [facility] to schedule with MD as soon as possible to counsel/schedule for D&C.   D/C home with bleeding precautions. ? ?ASSESSMENT ?1. Missed ab   ?2. [redacted] weeks gestation of pregnancy   ? ? ?PLAN ?Discharge home ?Allergies as of 09/01/2021   ?No Known Allergies ?  ? ?  ?Medication List  ?  ? ?TAKE these medications   ? ?Vitafol Ultra 29-0.6-0.4-200 MG Caps ?Take 1 tablet by mouth daily. ?  ? ?  ? ? Follow-up Information   ? ? [facility] Follow up.   ?Why: the office will call you with an appointment with MD to schedule your procedure. ?Contact information: ?[address] ?[place] [address] ?[phone] ? ?  ?  ? ?  [facility] Follow up.   ?Specialty: Obstetrics and Gynecology ?Why: As needed for emergencies ?Contact information: ?[address] ?[id number] ?[place] [address] ?[phone] ? ?  ?  ? ?  ?  ? ?  ? ? ?Misty Stanley Leftwich-Kirby ?Certified Nurse-Midwife ?09/01/2021  ?9:00 AM ? ? ?  ?

## 2021-09-04 ENCOUNTER — Ambulatory Visit (INDEPENDENT_AMBULATORY_CARE_PROVIDER_SITE_OTHER): Payer: Medicaid Other | Admitting: Obstetrics & Gynecology

## 2021-09-04 VITALS — BP 133/87 | HR 77

## 2021-09-04 DIAGNOSIS — O021 Missed abortion: Secondary | ICD-10-CM | POA: Diagnosis not present

## 2021-09-04 NOTE — Progress Notes (Signed)
Patient ID: Annette Wood, female   DOB: [birth year], 31 y.o.   MRN: [id number] ? ?Chief Complaint  ?Patient presents with  ? Follow-up  ?  SAB   ?8-9 week MAB dx 4/15 ? ?HPI ?Annette Wood is a 31 y.o. female.  [id number]. She was diagnosed with MAB 8.2 weeks by CRL on 4/15 and requests surgical management. She does have some cramps and bleeding.  ? ?HPI ? ?Past Medical History:  ?Diagnosis Date  ? Chlamydia   ? Gallstones   ? Gastroesophageal reflux   ? Gonorrhea   ? Panic attack   ? ? ?Past Surgical History:  ?Procedure Laterality Date  ? CHOLECYSTECTOMY N/A 11/24/2012  ? Procedure: LAPAROSCOPIC CHOLECYSTECTOMY WITH INTRAOPERATIVE CHOLANGIOGRAM;  Surgeon: Mariella Saa, MD;  Location: [facility];  Service: General;  Laterality: N/A;  ? CHOLECYSTECTOMY    ? ? ?Family History  ?Problem Relation Age of Onset  ? Diabetes Maternal Grandmother   ? Lupus Maternal Grandmother   ? ? ?Social History ?Social History  ? ?Tobacco Use  ? Smoking status: Never  ? Smokeless tobacco: Never  ?Vaping Use  ? Vaping Use: Never used  ?Substance Use Topics  ? Alcohol use: Not Currently  ? Drug use: No  ? ? ?No Known Allergies ? ?Current Outpatient Medications  ?Medication Sig Dispense Refill  ? Prenat-Fe Poly-Methfol-FA-DHA (VITAFOL ULTRA) 29-0.6-0.4-200 MG CAPS Take 1 tablet by mouth daily. 30 capsule 11  ? ?No current facility-administered medications for this visit.  ? ? ?Review of Systems ?Review of Systems  ?Constitutional: Negative.   ?Respiratory: Negative.    ?Cardiovascular: Negative.   ?Gastrointestinal: Negative.   ?Genitourinary:  Positive for pelvic pain and vaginal bleeding.  ? ?Blood pressure 133/87, pulse 77, last menstrual period 06/26/2021. ? ?Physical Exam ?Physical Exam ?Vitals and nursing note reviewed.  ?Constitutional:   ?   Appearance: Normal appearance.  ?Cardiovascular:  ?   Rate and Rhythm: Normal rate.  ?Pulmonary:  ?   Effort: Pulmonary effort is normal.  ?Abdominal:  ?   General: Abdomen is flat.   ?Neurological:  ?   General: No focal deficit present.  ?   Mental Status: She is alert and oriented to person, place, and time.  ?Psychiatric:     ?   Mood and Affect: Mood normal.     ?   Behavior: Behavior normal.  ? ? ?Data Reviewed ?Narrative & Impression  ?CLINICAL DATA:  Passing blood clots. ?  ?EXAM: ?TRANSVAGINAL OB ULTRASOUND ?  ?TECHNIQUE: ?Transvaginal ultrasound was performed for complete evaluation of the ?gestation as well as the maternal uterus, adnexal regions, and ?pelvic cul-de-sac. ?  ?COMPARISON:  Obstetrical ultrasound 08/13/2021 ?  ?FINDINGS: ?Intrauterine gestational sac: Single ?  ?Yolk sac:  Visualized.  Enlarged. ?  ?Embryo:  Visualized. ?  ?Cardiac Activity: Not Visualized. ?  ?Heart Rate: 0 bpm ?  ?CRL: 18.1 mm 8 w 2 d [place] EDC: 04/11/2009 increased ?  ?Subchorionic hemorrhage:  None visualized. ?  ?Maternal uterus/adnexae: Ovaries are within normal limits. No free ?fluid identified. ?  ?IMPRESSION: ?1. Intrauterine fetal demise measuring 8 weeks 2 days by crown-rump ?length. ?  ?  ?Electronically Signed ?  By: Darliss Cheney M.D. ?  On: 09/01/2021 00:26  ? ? ?Assessment ?MAB 8.2 week by [place] 4/15, wishes surgical completion  ? ?Plan ?Schedule D&E as outpatient.  The risks of surgery were discussed in detail with the patient including but not limited to: bleeding which may require transfusion  or reoperation; infection which may require prolonged hospitalization or re-hospitalization and antibiotic therapy; injury to bowel, bladder, ureters and major vessels or other surrounding organs which may lead to other procedures; formation of adhesions; need for additional procedures including laparotomy or subsequent procedures secondary to intraoperative injury or abnormal pathology; thromboembolic phenomenon; incisional problems and other postoperative or anesthesia complications.  Patient was told that the likelihood that her condition and symptoms will be treated effectively with this surgical  management was very high; the postoperative expectations were also discussed in detail. The patient also understands the alternative treatment options which were discussed in full. All questions were answered.  She was told that she will be contacted by our surgical scheduler regarding the time and date of her surgery; routine preoperative instructions will be given to her by the preoperative nursing team.  Printed patient education handouts about the procedure were given to the patient to review at home.  ? ? ? ?Scheryl Darter ?09/04/2021, 11:24 AM ? ? ? ?

## 2021-09-04 NOTE — Progress Notes (Signed)
Pt is in office for MAU f/u for SAB wanting ? Surgical management.  ?Pt is still having some heavy bleeding, having some pain/cramping.  Pt states she feels she has passed some tissue since MAU visit.  ? ? ?

## 2021-09-05 ENCOUNTER — Encounter (HOSPITAL_COMMUNITY): Payer: Self-pay | Admitting: Obstetrics and Gynecology

## 2021-09-06 ENCOUNTER — Encounter (HOSPITAL_COMMUNITY): Payer: Self-pay | Admitting: Obstetrics and Gynecology

## 2021-09-06 ENCOUNTER — Encounter (HOSPITAL_COMMUNITY)
Admission: RE | Disposition: A | Discharge status: Home / Self Care | Payer: Self-pay | Source: Home / Self Care | Attending: Obstetrics and Gynecology

## 2021-09-06 ENCOUNTER — Ambulatory Visit (HOSPITAL_COMMUNITY): Payer: Medicaid Other | Admitting: Anesthesiology

## 2021-09-06 ENCOUNTER — Other Ambulatory Visit: Payer: Self-pay

## 2021-09-06 ENCOUNTER — Ambulatory Visit (HOSPITAL_COMMUNITY)
Admission: RE | Admit: 2021-09-06 | Discharge: 2021-09-06 | Disposition: A | Discharge status: Home / Self Care | Payer: Medicaid Other | Attending: Obstetrics and Gynecology | Admitting: Obstetrics and Gynecology

## 2021-09-06 ENCOUNTER — Ambulatory Visit (HOSPITAL_BASED_OUTPATIENT_CLINIC_OR_DEPARTMENT_OTHER): Payer: Medicaid Other | Admitting: Anesthesiology

## 2021-09-06 DIAGNOSIS — Z6841 Body Mass Index (BMI) 40.0 and over, adult: Secondary | ICD-10-CM | POA: Diagnosis not present

## 2021-09-06 DIAGNOSIS — I10 Essential (primary) hypertension: Secondary | ICD-10-CM | POA: Diagnosis not present

## 2021-09-06 DIAGNOSIS — O021 Missed abortion: Secondary | ICD-10-CM

## 2021-09-06 DIAGNOSIS — K219 Gastro-esophageal reflux disease without esophagitis: Secondary | ICD-10-CM | POA: Insufficient documentation

## 2021-09-06 DIAGNOSIS — F419 Anxiety disorder, unspecified: Secondary | ICD-10-CM | POA: Diagnosis not present

## 2021-09-06 DIAGNOSIS — Z3A08 8 weeks gestation of pregnancy: Secondary | ICD-10-CM

## 2021-09-06 HISTORY — PX: DILATION AND EVACUATION: SHX1459

## 2021-09-06 LAB — CBC
HCT: 40.9 % (ref 36.0–46.0)
Hemoglobin: 13.6 g/dL (ref 12.0–15.0)
MCH: 30.1 pg (ref 26.0–34.0)
MCHC: 33.3 g/dL (ref 30.0–36.0)
MCV: 90.5 fL (ref 80.0–100.0)
Platelets: 318 10*3/uL (ref 150–400)
RBC: 4.52 MIL/uL (ref 3.87–5.11)
RDW: 12.8 % (ref 11.5–15.5)
WBC: 7 10*3/uL (ref 4.0–10.5)
nRBC: 0 % (ref 0.0–0.2)

## 2021-09-06 LAB — TYPE AND SCREEN
ABO/RH(D): B POS
Antibody Screen: NEGATIVE

## 2021-09-06 SURGERY — DILATION AND EVACUATION, UTERUS
Anesthesia: General

## 2021-09-06 MED ORDER — FENTANYL CITRATE (PF) 100 MCG/2ML IJ SOLN
INTRAMUSCULAR | Status: DC | PRN
  Administered 2021-09-06 (×3): 50 ug via INTRAVENOUS

## 2021-09-06 MED ORDER — METHYLERGONOVINE MALEATE 0.2 MG/ML IJ SOLN
INTRAMUSCULAR | Status: AC
  Filled 2021-09-06: qty 1

## 2021-09-06 MED ORDER — LIDOCAINE 2% (20 MG/ML) 5 ML SYRINGE
INTRAMUSCULAR | Status: AC
  Filled 2021-09-06: qty 5

## 2021-09-06 MED ORDER — LACTATED RINGERS IV SOLN: INTRAVENOUS | Status: DC

## 2021-09-06 MED ORDER — PROPOFOL 10 MG/ML IV BOLUS
INTRAVENOUS | Status: DC | PRN
  Administered 2021-09-06: 100 mg via INTRAVENOUS

## 2021-09-06 MED ORDER — MIDAZOLAM HCL 5 MG/5ML IJ SOLN
INTRAMUSCULAR | Status: DC | PRN
  Administered 2021-09-06: 2 mg via INTRAVENOUS

## 2021-09-06 MED ORDER — KETOROLAC TROMETHAMINE 30 MG/ML IJ SOLN
INTRAMUSCULAR | Status: AC
  Filled 2021-09-06: qty 1

## 2021-09-06 MED ORDER — ONDANSETRON HCL 4 MG/2ML IJ SOLN
INTRAMUSCULAR | Status: AC
  Filled 2021-09-06: qty 2

## 2021-09-06 MED ORDER — KETOROLAC TROMETHAMINE 30 MG/ML IJ SOLN
INTRAMUSCULAR | Status: DC | PRN
  Administered 2021-09-06: 30 mg via INTRAVENOUS

## 2021-09-06 MED ORDER — DEXAMETHASONE SODIUM PHOSPHATE 10 MG/ML IJ SOLN
INTRAMUSCULAR | Status: AC
  Filled 2021-09-06: qty 1

## 2021-09-06 MED ORDER — CHLORHEXIDINE GLUCONATE 0.12 % MT SOLN
OROMUCOSAL | Status: AC
  Administered 2021-09-06: 15 mL via OROMUCOSAL
  Filled 2021-09-06: qty 15

## 2021-09-06 MED ORDER — 0.9 % SODIUM CHLORIDE (POUR BTL) OPTIME
TOPICAL | Status: DC | PRN
  Administered 2021-09-06: 1000 mL

## 2021-09-06 MED ORDER — SOD CITRATE-CITRIC ACID 500-334 MG/5ML PO SOLN: 30.0000 mL | ORAL | Status: DC

## 2021-09-06 MED ORDER — POVIDONE-IODINE 10 % EX SWAB
2.0000 "application " | Freq: Once | CUTANEOUS | Status: AC
  Administered 2021-09-06: 2 via TOPICAL

## 2021-09-06 MED ORDER — ORAL CARE MOUTH RINSE: 15.0000 mL | Freq: Once | OROMUCOSAL | Status: AC

## 2021-09-06 MED ORDER — POVIDONE-IODINE 10 % EX SWAB: 2.0000 "application " | Freq: Once | CUTANEOUS | Status: DC

## 2021-09-06 MED ORDER — CHLORHEXIDINE GLUCONATE 0.12 % MT SOLN: 15.0000 mL | Freq: Once | OROMUCOSAL | Status: AC

## 2021-09-06 MED ORDER — OXYCODONE HCL 5 MG PO TABS: 5.0000 mg | ORAL_TABLET | Freq: Once | ORAL | Status: DC | PRN

## 2021-09-06 MED ORDER — FENTANYL CITRATE (PF) 100 MCG/2ML IJ SOLN: 25.0000 ug | INTRAMUSCULAR | Status: DC | PRN

## 2021-09-06 MED ORDER — ROCURONIUM BROMIDE 10 MG/ML (PF) SYRINGE
PREFILLED_SYRINGE | INTRAVENOUS | Status: AC
  Filled 2021-09-06: qty 10

## 2021-09-06 MED ORDER — OXYCODONE HCL 5 MG/5ML PO SOLN: 5.0000 mg | Freq: Once | ORAL | Status: DC | PRN

## 2021-09-06 MED ORDER — DOXYCYCLINE HYCLATE 100 MG IV SOLR
200.0000 mg | INTRAVENOUS | Status: AC
  Administered 2021-09-06: 200 mg via INTRAVENOUS
  Filled 2021-09-06: qty 200

## 2021-09-06 MED ORDER — FENTANYL CITRATE (PF) 250 MCG/5ML IJ SOLN
INTRAMUSCULAR | Status: AC
  Filled 2021-09-06: qty 5

## 2021-09-06 MED ORDER — ACETAMINOPHEN 500 MG PO TABS: 1000.0000 mg | ORAL_TABLET | ORAL | Status: DC

## 2021-09-06 MED ORDER — IBUPROFEN 600 MG PO TABS: 600.0000 mg | ORAL_TABLET | Freq: Four times a day (QID) | ORAL | 2 refills | Status: DC | PRN

## 2021-09-06 MED ORDER — ACETAMINOPHEN 500 MG PO TABS
1000.0000 mg | ORAL_TABLET | Freq: Once | ORAL | Status: AC
  Administered 2021-09-06: 1000 mg via ORAL
  Filled 2021-09-06: qty 2

## 2021-09-06 MED ORDER — DEXAMETHASONE SODIUM PHOSPHATE 10 MG/ML IJ SOLN
INTRAMUSCULAR | Status: DC | PRN
Start: 2021-09-06 — End: 2021-09-06
  Administered 2021-09-06: 10 mg via INTRAVENOUS

## 2021-09-06 MED ORDER — AMISULPRIDE (ANTIEMETIC) 5 MG/2ML IV SOLN: 10.0000 mg | Freq: Once | INTRAVENOUS | Status: DC | PRN

## 2021-09-06 MED ORDER — ONDANSETRON HCL 4 MG/2ML IJ SOLN: 4.0000 mg | Freq: Once | INTRAMUSCULAR | Status: DC | PRN

## 2021-09-06 MED ORDER — ONDANSETRON HCL 4 MG/2ML IJ SOLN
INTRAMUSCULAR | Status: DC | PRN
  Administered 2021-09-06: 4 mg via INTRAVENOUS

## 2021-09-06 MED ORDER — MIDAZOLAM HCL 2 MG/2ML IJ SOLN
INTRAMUSCULAR | Status: AC
  Filled 2021-09-06: qty 2

## 2021-09-06 SURGICAL SUPPLY — 23 items
CATH ROBINSON RED A/P 16FR (CATHETERS) ×2 IMPLANT
DECANTER SPIKE VIAL GLASS SM (MISCELLANEOUS) ×2 IMPLANT
FILTER UTR ASPR ASSEMBLY (MISCELLANEOUS) ×2 IMPLANT
GLOVE BIO SURGEON STRL SZ8 (GLOVE) ×2 IMPLANT
GLOVE BIOGEL PI IND STRL 7.0 (GLOVE) ×1 IMPLANT
GLOVE BIOGEL PI INDICATOR 7.0 (GLOVE) ×1
GOWN STRL REUS W/ TWL LRG LVL3 (GOWN DISPOSABLE) ×1 IMPLANT
GOWN STRL REUS W/ TWL XL LVL3 (GOWN DISPOSABLE) ×1 IMPLANT
GOWN STRL REUS W/TWL LRG LVL3 (GOWN DISPOSABLE) ×2
GOWN STRL REUS W/TWL XL LVL3 (GOWN DISPOSABLE) ×2
HOSE CONNECTING 18IN BERKELEY (TUBING) ×2 IMPLANT
KIT BERKELEY 1ST TRI 3/8 NO TR (MISCELLANEOUS) ×2 IMPLANT
KIT BERKELEY 1ST TRIMESTER 3/8 (MISCELLANEOUS) ×2 IMPLANT
NS IRRIG 1000ML POUR BTL (IV SOLUTION) ×2 IMPLANT
PACK VAGINAL MINOR WOMEN LF (CUSTOM PROCEDURE TRAY) ×2 IMPLANT
PAD OB MATERNITY 4.3X12.25 (PERSONAL CARE ITEMS) ×2 IMPLANT
SET BERKELEY SUCTION TUBING (SUCTIONS) ×2 IMPLANT
TOWEL GREEN STERILE FF (TOWEL DISPOSABLE) ×4 IMPLANT
UNDERPAD 30X36 HEAVY ABSORB (UNDERPADS AND DIAPERS) ×2 IMPLANT
VACURETTE 10 RIGID CVD (CANNULA) IMPLANT
VACURETTE 7MM CVD STRL WRAP (CANNULA) IMPLANT
VACURETTE 8 RIGID CVD (CANNULA) IMPLANT
VACURETTE 9 RIGID CVD (CANNULA) IMPLANT

## 2021-09-06 NOTE — H&P (Signed)
?OB/GYN Pre-Op History and Physical ? ?Annette Wood is a 31 y.o. [phone] presenting for suction dilation and curettage due to missed miscarriage. Pt does note continued vaginal bleeding but denies any current cramping. ? ?  ? ? ?Past Medical History:  ?Diagnosis Date  ? Chlamydia   ? Gallstones   ? Gastroesophageal reflux   ? Gonorrhea   ? Panic attack   ? ? ?Past Surgical History:  ?Procedure Laterality Date  ? CHOLECYSTECTOMY N/A 11/24/2012  ? Procedure: LAPAROSCOPIC CHOLECYSTECTOMY WITH INTRAOPERATIVE CHOLANGIOGRAM;  Surgeon: Mariella Saa, MD;  Location: [facility];  Service: General;  Laterality: N/A;  ? CHOLECYSTECTOMY    ? ? ?OB History  ?Gravida Para Term Preterm AB Living  ?5 3 3  0 1 3  ?SAB IAB Ectopic Multiple Live Births  ?0 1 0 0 3  ?  ?# Outcome Date GA Lbr Len/2nd Weight Sex Delivery Anes PTL Lv  ?5 Current           ?4 IAB 2020          ?3 Term 04/01/13 [redacted]w[redacted]d 05:40 / 00:15 3221 g F Vag-Spont EPI  LIV  ?2 Term 09/20/09 [redacted]w[redacted]d  3402 g F Vag-Spont EPI N LIV  ?1 Term 06/09/08 [redacted]w[redacted]d  2807 g M Vag-Spont None N LIV  ?   Birth Comments: delivered in car  ? ? ?Social History  ? ?Socioeconomic History  ? Marital status: Single  ?  Spouse name: Not on file  ? Number of children: Not on file  ? Years of education: Not on file  ? Highest education level: Not on file  ?Occupational History  ? Not on file  ?Tobacco Use  ? Smoking status: Never  ? Smokeless tobacco: Never  ?Vaping Use  ? Vaping Use: Never used  ?Substance and Sexual Activity  ? Alcohol use: Not Currently  ? Drug use: No  ? Sexual activity: Yes  ?  Partners: Male  ?  Birth control/protection: None  ?Other Topics Concern  ? Not on file  ?Social History Narrative  ? Not on file  ? ?Social Determinants of Health  ? ?Financial Resource Strain: Not on file  ?Food Insecurity: Not on file  ?Transportation Needs: Not on file  ?Physical Activity: Not on file  ?Stress: Not on file  ?Social Connections: Not on file  ? ? ?Family History  ?Problem Relation  Age of Onset  ? Diabetes Maternal Grandmother   ? Lupus Maternal Grandmother   ? ? ?No medications prior to admission.  ? ? ?No Known Allergies ? ?Review of Systems: Negative except for what is mentioned in HPI. ? ?  ? ?Physical Exam: ?BP 131/85   Pulse 63   Temp 98.3 ?F (36.8 ?C) (Oral)   Resp 17   Ht 5\' 3"  (1.6 m)   Wt 112 kg   LMP 06/26/2021   SpO2 99%   BMI 43.75 kg/m?  ?CONSTITUTIONAL: Well-developed, well-nourished female in no acute distress.  ?HENT:  Normocephalic, atraumatic, External right and left ear normal. Oropharynx is clear and moist ?EYES: Conjunctivae and EOM are normal.  ?NECK: Normal range of motion, supple, no masses ?SKIN: Skin is warm and dry. No rash noted. Not diaphoretic. No erythema. No pallor. ?NEUROLGIC: Alert and oriented to person, place, and time. Normal reflexes, muscle tone coordination. No cranial nerve deficit noted. ?PSYCHIATRIC: Normal mood and affect. Normal behavior. Normal judgment and thought content. ?CARDIOVASCULAR: Normal heart rate noted, regular rhythm ?RESPIRATORY: Effort and breath sounds normal,  no problems with respiration noted ?ABDOMEN: Soft, nontender, nondistended, old laparoscopy scars noted ?PELVIC: Deferred ?MUSCULOSKELETAL: Normal range of motion. No edema and no tenderness. 2+ distal pulses. ? ? ?Pertinent Labs/Studies:   ?Results for orders placed or performed during the hospital encounter of 09/06/21 (from the past 72 hour(s))  ?CBC     Status: None  ? Collection Time: 09/06/21  9:39 AM  ?Result Value Ref Range  ? WBC 7.0 4.0 - 10.5 K/uL  ? RBC 4.52 3.87 - 5.11 MIL/uL  ? Hemoglobin 13.6 12.0 - 15.0 g/dL  ? HCT 40.9 36.0 - 46.0 %  ? MCV 90.5 80.0 - 100.0 fL  ? MCH 30.1 26.0 - 34.0 pg  ? MCHC 33.3 30.0 - 36.0 g/dL  ? RDW 12.8 11.5 - 15.5 %  ? Platelets 318 150 - 400 K/uL  ? nRBC 0.0 0.0 - 0.2 %  ?  Comment: Performed at [facility], [address] [place], [place] [address]  ?CLINICAL DATA:  Passing blood clots. ?  ?EXAM: ?TRANSVAGINAL OB  ULTRASOUND ?  ?TECHNIQUE: ?Transvaginal ultrasound was performed for complete evaluation of the ?gestation as well as the maternal uterus, adnexal regions, and ?pelvic cul-de-sac. ?  ?COMPARISON:  Obstetrical ultrasound 08/13/2021 ?  ?FINDINGS: ?Intrauterine gestational sac: Single ?  ?Yolk sac:  Visualized.  Enlarged. ?  ?Embryo:  Visualized. ?  ?Cardiac Activity: Not Visualized. ?  ?Heart Rate: 0 bpm ?  ?CRL: 18.1 mm 8 w 2 d [place] EDC: 04/11/2009 increased ?  ?Subchorionic hemorrhage:  None visualized. ?  ?Maternal uterus/adnexae: Ovaries are within normal limits. No free ?fluid identified. ?  ?IMPRESSION: ?1. Intrauterine fetal demise measuring 8 weeks 2 days by crown-rump ?length. ?  ? ?  ? ?Assessment and Plan :Annette Wood is a 31 y.o. [phone] here for suction dilation and curettage due to missed miscarriage. ? ? ?Plan for Suction D and E ?Risks and benefits of the procedure have been given including bleeding, infection, involvement of other organs including bladder and bowel as well as uterine perforation. Procedure discussed in detail.  Pt wishes to proceed. ? ?NPO ?Admission labs ordered ?VS Q4 ? ? ? ?Mariel Aloe, M.D. ?Attending Obstetrician & Gynecologist, Faculty Practice ?Center for [employer], [facility]  ?

## 2021-09-06 NOTE — Op Note (Signed)
Annette Wood ?PROCEDURE DATE: 09/06/2021 ? ?PREOPERATIVE DIAGNOSIS: 8 week missed abortion ?POSTOPERATIVE DIAGNOSIS: The same ?PROCEDURE:     Dilation and Evacuation ?SURGEON:  Lynnda Shields ? ?INDICATIONS: 31 y.o. [id number] with MAB at 8.[redacted] weeks gestation, needing surgical completion.  Risks of surgery were discussed with the patient including but not limited to: bleeding which may require transfusion; infection which may require antibiotics; injury to uterus or surrounding organs; need for additional procedures including laparotomy or laparoscopy; possibility of intrauterine scarring which may impair future fertility; and other postoperative/anesthesia complications. Written informed consent was obtained.   ? ?FINDINGS:  A 9 1/2 week size uterus, small to moderate amounts of products of conception, specimen sent to pathology. ? ?ANESTHESIA: General-LMA ?INTRAVENOUS FLUIDS:  750 ml of LR ?ESTIMATED BLOOD LOSS:  Less than 15 ml. ?UOP: 100 ml ?SPECIMENS:  Products of conception sent to pathology ?COMPLICATIONS:  None immediate. ? ?PROCEDURE DETAILS:  The patient received intravenous Doxycycline while in the preoperative area.  She was then taken to the operating room where general anesthesia was administered and was found to be adequate.  After an adequate timeout was performed, she was placed in the dorsal lithotomy position and examined; then prepped and draped in the sterile manner.   Her bladder was catheterized for 100 ml of clear, yellow urine. A vaginal speculum was then placed in the patient's vagina and a single tooth tenaculum was applied to the anterior lip of the cervix.  The uterus was sounded to 9 cm.  The cervix was gently dilated to accommodate a 9 mm suction curette that was gently advanced to the uterine fundus. The suction device was then activated and curette slowly rotated to clear the uterus of products of conception.  Suction curettage was done until complete emptying of the uterus was  confirmed. There was minimal bleeding noted and the tenaculum removed with good hemostasis noted.   All instruments were removed from the patient's vagina.  Sponge and instrument counts were correct times two  The patient tolerated the procedure well and was taken to the recovery area extubated, awake, and in stable condition. ? ?The patient will be discharged to home as per PACU criteria.  Routine postoperative instructions given.  She was prescribed Oxycodone, Ibuprofen and Colace.  She will follow up in the office in 2-3 weeks for postoperative evaluation ? ?Lynnda Shields, MD, [place] ?Obstetrician Social research officer, government, Faculty Practice ?Center for [facility] ?  ?

## 2021-09-06 NOTE — Transfer of Care (Signed)
Immediate Anesthesia Transfer of Care Note ? ?Patient: Annette Wood ? ?Procedure(s) Performed: DILATATION AND EVACUATION ? ?Patient Location: PACU ? ?Anesthesia Type:General ? ?Level of Consciousness: drowsy and patient cooperative ? ?Airway & Oxygen Therapy: Patient Spontanous Breathing and Patient connected to face mask oxygen ? ?Post-op Assessment: Report given to RN and Post -op Vital signs reviewed and stable ? ?Post vital signs: Reviewed and stable ? ?Last Vitals:  ?Vitals Value Taken Time  ?BP 108/61 09/06/21 1221  ?Temp    ?Pulse 127 09/06/21 1226  ?Resp 29 09/06/21 1226  ?SpO2 88 % 09/06/21 1226  ?Vitals shown include unvalidated device data. ? ?Last Pain:  ?Vitals:  ? 09/06/21 0912  ?TempSrc:   ?PainSc: 0-No pain  ?   ? ?  ? ?Complications: No notable events documented. ?

## 2021-09-06 NOTE — Anesthesia Procedure Notes (Signed)
Procedure Name: LMA Insertion ?Date/Time: 09/06/2021 11:43 AM ?Performed by: Marny Lowenstein, CRNA ?Pre-anesthesia Checklist: Patient identified, Emergency Drugs available, Suction available and Patient being monitored ?Patient Re-evaluated:Patient Re-evaluated prior to induction ?Oxygen Delivery Method: Circle system utilized ?Preoxygenation: Pre-oxygenation with 100% oxygen ?Induction Type: IV induction ?Ventilation: Mask ventilation without difficulty ?LMA: LMA inserted ?LMA Size: 4.0 ?Number of attempts: 1 ?Placement Confirmation: positive ETCO2 and breath sounds checked- equal and bilateral ?Tube secured with: Tape ?Dental Injury: Teeth and Oropharynx as per pre-operative assessment  ? ? ? ? ?

## 2021-09-06 NOTE — Anesthesia Postprocedure Evaluation (Signed)
Anesthesia Post Note ? ?Patient: Annette Wood ? ?Procedure(s) Performed: DILATATION AND EVACUATION ? ?  ? ?Patient location during evaluation: PACU ?Anesthesia Type: General ?Level of consciousness: awake ?Pain management: pain level controlled ?Vital Signs Assessment: post-procedure vital signs reviewed and stable ?Respiratory status: spontaneous breathing and respiratory function stable ?Cardiovascular status: stable ?Postop Assessment: no apparent nausea or vomiting ?Anesthetic complications: no ? ? ?No notable events documented. ? ?Last Vitals:  ?Vitals:  ? 09/06/21 1238 09/06/21 1250  ?BP: 101/69 108/74  ?Pulse: (!) 103 82  ?Resp: 19 20  ?Temp:  (!) 36.2 ?C  ?SpO2: 92% 92%  ?  ?Last Pain:  ?Vitals:  ? 09/06/21 1250  ?TempSrc:   ?PainSc: 0-No pain  ? ? ?  ?  ?  ?  ?  ?  ? ?Mellody Dance ? ? ? ? ?

## 2021-09-06 NOTE — Anesthesia Preprocedure Evaluation (Addendum)
Anesthesia Evaluation  ?Patient identified by MRN, date of birth, ID band ?Patient awake ? ? ? ?Reviewed: ?Allergy & Precautions, NPO status , Patient's Chart, lab work & pertinent test results ? ?Airway ?Mallampati: III ? ?TM Distance: >3 FB ?Neck ROM: Full ? ? ? Dental ?no notable dental hx. ? ?  ?Pulmonary ?neg pulmonary ROS,  ?  ?Pulmonary exam normal ?breath sounds clear to auscultation ? ? ? ? ? ? Cardiovascular ?Exercise Tolerance: Good ?hypertension, Normal cardiovascular exam ?Rhythm:Regular Rate:Normal ? ? ?  ?Neuro/Psych ?PSYCHIATRIC DISORDERS Anxiety   ? GI/Hepatic ?Neg liver ROS, GERD  ,  ?Endo/Other  ?Morbid obesity ? Renal/GU ?negative Renal ROS  ?negative genitourinary ?  ?Musculoskeletal ?negative musculoskeletal ROS ?(+)  ? Abdominal ?(+) + obese,   ?Peds ?negative pediatric ROS ?(+)  Hematology ?negative hematology ROS ?(+)   ?Anesthesia Other Findings ? ? Reproductive/Obstetrics ?negative OB ROS ? ?  ? ? ? ? ? ? ? ? ? ? ? ? ? ?  ?  ? ? ? ? ? ? ? ?Anesthesia Physical ?Anesthesia Plan ? ?ASA: 3 and emergent ? ?Anesthesia Plan: General  ? ?Post-op Pain Management:   ? ?Induction: Intravenous ? ?PONV Risk Score and Plan: Scopolamine patch - Pre-op, Treatment may vary due to age or medical condition, Midazolam, Ondansetron and Dexamethasone ? ?Airway Management Planned: LMA ? ?Additional Equipment: None ? ?Intra-op Plan:  ? ?Post-operative Plan: Extubation in OR ? ?Informed Consent: I have reviewed the patients History and Physical, chart, labs and discussed the procedure including the risks, benefits and alternatives for the proposed anesthesia with the patient or authorized representative who has indicated his/her understanding and acceptance.  ? ? ? ?Dental advisory given ? ?Plan Discussed with: CRNA, Anesthesiologist and Surgeon ? ?Anesthesia Plan Comments:   ? ? ? ? ? ? ?Anesthesia Quick Evaluation ? ?

## 2021-09-07 ENCOUNTER — Encounter (HOSPITAL_COMMUNITY): Payer: Self-pay | Admitting: Obstetrics and Gynecology

## 2021-09-07 LAB — SURGICAL PATHOLOGY

## 2021-09-20 ENCOUNTER — Ambulatory Visit (INDEPENDENT_AMBULATORY_CARE_PROVIDER_SITE_OTHER): Payer: Medicaid Other | Admitting: Obstetrics and Gynecology

## 2021-09-20 ENCOUNTER — Encounter: Payer: Self-pay | Admitting: Obstetrics and Gynecology

## 2021-09-20 VITALS — BP 133/91 | HR 71 | Wt 250.7 lb

## 2021-09-20 DIAGNOSIS — Z4889 Encounter for other specified surgical aftercare: Secondary | ICD-10-CM

## 2021-09-20 NOTE — Progress Notes (Signed)
? ? ?  Subjective:  ?  ?CHAUNCEY BRUNO is a 31 y.o. female who presents to the clinic status post dilation and evacuation on 09/06/21. The patient is not having any pain.  Eating a regular diet without difficulty. Bowel movements are normal. No other significant postoperative concerns.  Pt did have intercourse about 1 week after the proceudre.  She declines birth control at this time. ? ?The following portions of the patient's history were reviewed and updated as appropriate: allergies, current medications, past family history, past medical history, past social history, past surgical history, and problem list..  Last pap smear was normal on 11/2020. ? ?Review of Systems ?Pertinent items are noted in HPI. ?  ?Objective:  ? ?BP (!) 133/91   Pulse 71   Wt 113.7 kg   LMP 06/26/2021   Breastfeeding Unknown   BMI 44.41 kg/m?  ?Constitutional:  Well-developed, well-nourished female in no acute distress.   ?Skin: Skin is warm and dry, no rash noted, not diaphoretic,no erythema, no pallor.  ?Cardiovascular: Normal heart rate noted  ?Respiratory: Effort and breath sounds normal, no problems with respiration noted  ?Abdomen: Soft, bowel sounds active, non-tender, no abnormal masses  ?Incision: Healing well, no drainage, no erythema, no hernia, no seroma, no swelling, no dehiscence, incision well approximated  ?Pelvic:    Normal external genitalia, no abnormal discharge, cvx WNL  ? ?Surgical pathology () ?Consistent with products of conception ?Assessment:  ? ?Doing well postoperatively.  Operative findings again reviewed. Pathology report discussed. ?  ?Plan:  ? ?1. Continue any current medications. ?2. Wound care discussed. ?3. Activity restrictions: none ?4. Anticipated return to work: now. ?5. Follow up as needed, annual exam 01/2022 ?6.  Routine preventative health maintenance measures emphasized. ?Please refer to After Visit Summary for other counseling recommendations.  ? ? ?Mariel Aloe, MD, FACOG ?Attending  Obstetrician & Gynecologist ?Center for [employer], [facility]  ?

## 2021-09-20 NOTE — Progress Notes (Signed)
Pt is in the office to follow up after D&C on 09/06/21. Pt denies bleeding and pain today. Pt reports unprotected intercourse 2 times since procedure. ?

## 2021-09-27 ENCOUNTER — Telehealth: Payer: Self-pay | Admitting: *Deleted

## 2021-09-27 NOTE — Telephone Encounter (Signed)
Returned TC. Pt reports recent SAB and then [facility] 09/06/21. Reports VB stopped shortly after procedure. Concerned for no menses since then and had unprotected sex after procedure. Advised that it may take time for endometrial lining to build back up and for menses. Advised that pregnancy can not be ruled out. Patient will wait for menses until around 10/06/21 and take UPT if it does not start or for other signs of pregnancy. Advised pt that it is best to allow some time to pass between SAB and trying to conceive. Pt verbalized understanding. ?

## 2021-10-02 ENCOUNTER — Encounter: Payer: Medicaid Other | Admitting: Obstetrics

## 2021-10-30 ENCOUNTER — Telehealth: Payer: Self-pay | Admitting: *Deleted

## 2021-10-30 NOTE — Telephone Encounter (Signed)
Pt experienced D & C following SAB 09/06/21. Had menses 10/03/21. Question regarding when to expect next menses and what to do if it does not come. Advised that while menses generally comes every 28-30 days, it may take time for her body to become regulated again. Advised to wait for menses and only do UPT if she is concerned for pregnancy. Pt verbalized understanding.

## 2021-12-13 ENCOUNTER — Ambulatory Visit: Payer: Medicaid Other

## 2021-12-17 ENCOUNTER — Other Ambulatory Visit (HOSPITAL_COMMUNITY)
Admission: RE | Admit: 2021-12-17 | Discharge: 2021-12-17 | Disposition: A | Discharge status: Home / Self Care | Payer: Medicaid Other | Source: Ambulatory Visit | Attending: Obstetrics and Gynecology | Admitting: Obstetrics and Gynecology

## 2021-12-17 ENCOUNTER — Ambulatory Visit (INDEPENDENT_AMBULATORY_CARE_PROVIDER_SITE_OTHER): Payer: Medicaid Other | Admitting: Emergency Medicine

## 2021-12-17 VITALS — BP 129/90 | HR 90 | Ht 63.0 in | Wt 261.4 lb

## 2021-12-17 DIAGNOSIS — Z7251 High risk heterosexual behavior: Secondary | ICD-10-CM | POA: Diagnosis not present

## 2021-12-17 DIAGNOSIS — Z113 Encounter for screening for infections with a predominantly sexual mode of transmission: Secondary | ICD-10-CM | POA: Diagnosis not present

## 2021-12-17 DIAGNOSIS — N898 Other specified noninflammatory disorders of vagina: Secondary | ICD-10-CM

## 2021-12-17 LAB — POCT URINE PREGNANCY: Preg Test, Ur: NEGATIVE

## 2021-12-17 NOTE — Progress Notes (Signed)
SUBJECTIVE:  31 y.o. female reports unprotected intercourse about a week ago.  Denies vaginal discharge, vaginal itching and irritation. Denies abnormal vaginal bleeding or significant pelvic pain or fever. No UTI symptoms. Denies history of known exposure to STD.  Desires UPT for previous unprotected encounters.  Patient's last menstrual period was 11/30/2021 (exact date).   OBJECTIVE:  She appears well, afebrile. Urine dipstick: not done.  ASSESSMENT:  Vaginal Discharge  Vaginal Odor   PLAN:  GC, chlamydia, trichomonas, BVAG, CVAG probe sent to lab. Treatment: To be determined once lab results are received ROV prn if symptoms persist or worsen.  UPT negative in office today.

## 2021-12-18 ENCOUNTER — Other Ambulatory Visit: Payer: Self-pay | Admitting: Obstetrics & Gynecology

## 2021-12-18 DIAGNOSIS — B9689 Other specified bacterial agents as the cause of diseases classified elsewhere: Secondary | ICD-10-CM

## 2021-12-18 LAB — CERVICOVAGINAL ANCILLARY ONLY
Bacterial Vaginitis (gardnerella): POSITIVE — AB
Candida Glabrata: NEGATIVE
Candida Vaginitis: NEGATIVE
Chlamydia: NEGATIVE
Comment: NEGATIVE
Comment: NEGATIVE
Comment: NEGATIVE
Comment: NEGATIVE
Comment: NEGATIVE
Comment: NORMAL
Neisseria Gonorrhea: NEGATIVE
Trichomonas: NEGATIVE

## 2021-12-18 MED ORDER — METRONIDAZOLE 500 MG PO TABS: 500.0000 mg | ORAL_TABLET | Freq: Two times a day (BID) | ORAL | 0 refills | Status: AC

## 2022-01-18 ENCOUNTER — Telehealth: Payer: Medicaid Other | Admitting: Physician Assistant

## 2022-01-18 DIAGNOSIS — L239 Allergic contact dermatitis, unspecified cause: Secondary | ICD-10-CM | POA: Diagnosis not present

## 2022-01-18 MED ORDER — PREDNISONE 10 MG PO TABS: ORAL_TABLET | ORAL | 0 refills | Status: AC

## 2022-01-18 MED ORDER — TRIAMCINOLONE ACETONIDE 0.1 % EX CREA: 1.0000 | TOPICAL_CREAM | Freq: Two times a day (BID) | CUTANEOUS | 0 refills | Status: DC

## 2022-01-18 NOTE — Progress Notes (Signed)
Virtual Visit Consent   Annette Wood, you are scheduled for a virtual visit with a [facility] provider today. Just as with appointments in the office, your consent must be obtained to participate. Your consent will be active for this visit and any virtual visit you may have with one of our providers in the next 365 days. If you have a MyChart account, a copy of this consent can be sent to you electronically.  As this is a virtual visit, video technology does not allow for your provider to perform a traditional examination. This may limit your provider's ability to fully assess your condition. If your provider identifies any concerns that need to be evaluated in person or the need to arrange testing (such as labs, EKG, etc.), we will make arrangements to do so. Although advances in technology are sophisticated, we cannot ensure that it will always work on either your end or our end. If the connection with a video visit is poor, the visit may have to be switched to a telephone visit. With either a video or telephone visit, we are not always able to ensure that we have a secure connection.  By engaging in this virtual visit, you consent to the provision of healthcare and authorize for your insurance to be billed (if applicable) for the services provided during this visit. Depending on your insurance coverage, you may receive a charge related to this service.  I need to obtain your verbal consent now. Are you willing to proceed with your visit today? Annette Wood has provided verbal consent on 01/18/2022 for a virtual visit (video or telephone). Piedad Climes, [place]  Date: 01/18/2022 3:28 PM  Virtual Visit via Video Note   I, Piedad Climes, connected with  Annette Wood  ([id number], Dec 22, 1990) on 01/18/22 at  3:15 PM EDT by a video-enabled telemedicine application and verified that I am speaking with the correct person using two identifiers.  Location: Patient: Virtual Visit Location  Patient: Home Provider: Virtual Visit Location Provider: Home Office   I discussed the limitations of evaluation and management by telemedicine and the availability of in person appointments. The patient expressed understanding and agreed to proceed.    History of Present Illness: Annette Wood is a 31 y.o. who identifies as a female who was assigned female at birth, and is being seen today for pruritic rash first noted 2 days ago starting on her abdomen and right arm, now spread to the left arm and groin.  Rash is itchy and uncomfortable.  Denies drainage from rash.  Denies fever, chills.  Denies any URI or GI symptoms.  Denies recent travel or sick contact.  Denies any known changes to soaps, lotions, detergents or other products applied to the skin.   HPI: HPI  Problems:  Patient Active Problem List   Diagnosis Date Noted   Missed abortion    Hypertension 09/18/2017   ASCUS of cervix with negative high risk HPV 08/18/2017   Unspecified vitamin D deficiency 08/27/2012    Allergies: No Known Allergies Medications:  Current Outpatient Medications:    predniSONE (DELTASONE) 10 MG tablet, Take 4 tablets (40 mg total) by mouth daily with breakfast for 4 days, THEN 3 tablets (30 mg total) daily with breakfast for 4 days, THEN 2 tablets (20 mg total) daily with breakfast for 3 days, THEN 1 tablet (10 mg total) daily with breakfast for 3 days., Disp: 37 tablet, Rfl: 0   triamcinolone cream (KENALOG) 0.1 %,  Apply 1 Application topically 2 (two) times daily., Disp: 30 g, Rfl: 0  Observations/Objective: Patient is well-developed, well-nourished in no acute distress.  Resting comfortably at home.  Head is normocephalic, atraumatic.  No labored breathing. Speech is clear and coherent with logical content.  Patient is alert and oriented at baseline.  Rash visualized.  Rash on left arm is erythematous and papulovesicular, linear in pattern.  Similar areas noted of abdomen and right  arm.  Assessment and Plan: 1. Allergic contact dermatitis, unspecified trigger - triamcinolone cream (KENALOG) 0.1 %; Apply 1 Application topically 2 (two) times daily.  Dispense: 30 g; Refill: 0 - predniSONE (DELTASONE) 10 MG tablet; Take 4 tablets (40 mg total) by mouth daily with breakfast for 4 days, THEN 3 tablets (30 mg total) daily with breakfast for 4 days, THEN 2 tablets (20 mg total) daily with breakfast for 3 days, THEN 1 tablet (10 mg total) daily with breakfast for 3 days.  Dispense: 37 tablet; Refill: 0  Symptoms consistent with an allergic contact dermatitis with unknown trigger.  She is to keep a symptom journal and monitor any new products applied to the skin.  Supportive measures and OTC medications reviewed.  Can start OTC antihistamine.  Rx Kenalog to apply to rash expect for groin and face/axilla if spreading to those areas.  Prednisone taper as directed.  Follow Up Instructions: I discussed the assessment and treatment plan with the patient. The patient was provided an opportunity to ask questions and all were answered. The patient agreed with the plan and demonstrated an understanding of the instructions.  A copy of instructions were sent to the patient via MyChart unless otherwise noted below.   The patient was advised to call back or seek an in-person evaluation if the symptoms worsen or if the condition fails to improve as anticipated.  Time:  I spent 10 minutes with the patient via telehealth technology discussing the above problems/concerns.    Piedad Climes, PA-C

## 2022-01-18 NOTE — Patient Instructions (Signed)
  Annette Wood, thank you for joining Piedad Climes, PA-C for today's virtual visit.  While this provider is not your primary care provider (PCP), if your PCP is located in our provider database this encounter information will be shared with them immediately following your visit.  Consent: (Patient) Annette Wood provided verbal consent for this virtual visit at the beginning of the encounter.  Current Medications: No current outpatient medications on file.   Medications ordered in this encounter:  No orders of the defined types were placed in this encounter.    *If you need refills on other medications prior to your next appointment, please contact your pharmacy*  Follow-Up: Call back or seek an in-person evaluation if the symptoms worsen or if the condition fails to improve as anticipated.  Other Instructions Please keep well-hydrated. Consider starting an over-the-counter antihistamine like Claritin or Zyrtec. Cool compresses may also be beneficial. Take the steroid taper as directed. Apply the cream as directed, sparing any rash near the privates.  If symptoms not resolving or you note any new or worsening symptoms, please notify us immediately or seek an in-person evaluation.   If you have been instructed to have an in-person evaluation today at a local Urgent Care facility, please use the link below. It will take you to a list of all of our available [facility], including address, phone number and hours of operation. Please do not delay care.  [facility]  If you or a family member do not have a primary care provider, use the link below to schedule a visit and establish care. When you choose a [facility] primary care physician or advanced practice provider, you gain a long-term partner in health. Find a Primary Care Provider  Learn more about [facility]'s in-office and virtual care options: [facility] - Get Care Now

## 2022-01-19 ENCOUNTER — Ambulatory Visit (HOSPITAL_COMMUNITY): Payer: Medicaid Other

## 2022-01-30 ENCOUNTER — Ambulatory Visit: Payer: Medicaid Other | Admitting: Student

## 2022-02-18 ENCOUNTER — Ambulatory Visit: Payer: Medicaid Other | Admitting: Advanced Practice Midwife

## 2022-02-26 ENCOUNTER — Ambulatory Visit (INDEPENDENT_AMBULATORY_CARE_PROVIDER_SITE_OTHER): Payer: Medicaid Other | Admitting: Obstetrics and Gynecology

## 2022-02-26 ENCOUNTER — Encounter: Payer: Self-pay | Admitting: Obstetrics and Gynecology

## 2022-02-26 ENCOUNTER — Other Ambulatory Visit (HOSPITAL_COMMUNITY)
Admission: RE | Admit: 2022-02-26 | Discharge: 2022-02-26 | Disposition: A | Discharge status: Home / Self Care | Payer: Medicaid Other | Source: Ambulatory Visit | Attending: Student | Admitting: Student

## 2022-02-26 VITALS — BP 135/88 | HR 79 | Ht 63.0 in | Wt 263.5 lb

## 2022-02-26 DIAGNOSIS — N760 Acute vaginitis: Secondary | ICD-10-CM | POA: Insufficient documentation

## 2022-02-26 DIAGNOSIS — Z30430 Encounter for insertion of intrauterine contraceptive device: Secondary | ICD-10-CM | POA: Diagnosis not present

## 2022-02-26 DIAGNOSIS — Z3009 Encounter for other general counseling and advice on contraception: Secondary | ICD-10-CM

## 2022-02-26 LAB — POCT URINE PREGNANCY: Preg Test, Ur: NEGATIVE

## 2022-02-26 MED ORDER — LEVONORGESTREL 20 MCG/DAY IU IUD
1.0000 | INTRAUTERINE_SYSTEM | Freq: Once | INTRAUTERINE | Status: AC
  Administered 2022-02-26: 1 via INTRAUTERINE

## 2022-02-26 NOTE — Progress Notes (Signed)
   Subjective:    Patient ID: Annette Wood, female    DOB: March 19, 1991, 31 y.o.   MRN: [id number]  Patient with recent SAB and no longer wants to try to conceive at this time and would like to proceed with lng-IUD insertion. Prior nexplanon use but had significant weight gain and had IUD several years ago.    Review of Systems  Constitutional:  Negative for chills and fever.  Respiratory:  Negative for shortness of breath.   Cardiovascular:  Negative for chest pain.  Gastrointestinal:  Negative for constipation, diarrhea, nausea and vomiting.  All other systems reviewed and are negative.     Objective:  BP 135/88   Pulse 79   Ht 5\' 3"  (1.6 m)   Wt 263 lb 8 oz (119.5 kg)   LMP 02/17/2001   BMI 46.68 kg/m   Physical Exam Vitals and nursing note reviewed.  Constitutional:      Appearance: Normal appearance.  HENT:     Head: Normocephalic and atraumatic.  Cardiovascular:     Rate and Rhythm: Normal rate and regular rhythm.  Pulmonary:     Effort: Pulmonary effort is normal.     Breath sounds: Normal breath sounds.  Skin:    General: Skin is warm and dry.  Neurological:     General: No focal deficit present.     Mental Status: She is alert.  Psychiatric:        Mood and Affect: Mood normal.        Behavior: Behavior normal.        Thought Content: Thought content normal.        Judgment: Judgment normal.   IUD Insertion Procedure Note Patient identified, informed consent performed, consent signed.   Discussed risks of irregular bleeding, cramping, infection, malpositioning or misplacement of the IUD outside the uterus which may require further procedure such as laparoscopy. Also discussed >99% contraception efficacy, increased risk of ectopic pregnancy with failure of method.  Time out was performed.  Urine pregnancy test negative.  Speculum placed in the vagina.  Cervix visualized.  Cleaned with Betadine x 2.  Anterior cervix grasped anteriorly with a single tooth  tenaculum.  Paracervical block was not administered. Mirena IUD placed per manufacturer's recommendations.  Strings trimmed to 3 cm. Tenaculum was removed, good hemostasis noted.  Patient tolerated procedure well.   Patient was given post-procedure instructions.  She was advised to have backup contraception for one week.  Patient was also asked to check IUD strings periodically and follow up prn for IUD check.      Assessment & Plan:   1. Acute vaginitis Vaginal swab completed today  - Cervicovaginal ancillary only( [place])  2. Encounter for counseling regarding contraception Reviewed various contraception options and patient elected for lng-IUD insertion today and s/p uncomplicated insertion. Reviewed postprocedure instructions as well as indication and duration of use  - POCT urine pregnancy

## 2022-02-26 NOTE — Progress Notes (Signed)
Patient presents for birth control consult. Patient states that she is interested in getting an IUD. Last had intercourse about a month ago. Patient also complains of having increased vaginal discharge with a brown tinge color. No odor, irritation, or itching. Desire swab  Last pap: 11/21/2020 Normal

## 2022-02-27 LAB — CERVICOVAGINAL ANCILLARY ONLY
Bacterial Vaginitis (gardnerella): POSITIVE — AB
Candida Glabrata: NEGATIVE
Candida Vaginitis: NEGATIVE
Chlamydia: NEGATIVE
Comment: NEGATIVE
Comment: NEGATIVE
Comment: NEGATIVE
Comment: NEGATIVE
Comment: NEGATIVE
Comment: NORMAL
Neisseria Gonorrhea: NEGATIVE
Trichomonas: NEGATIVE

## 2022-03-01 ENCOUNTER — Other Ambulatory Visit: Payer: Self-pay | Admitting: Obstetrics and Gynecology

## 2022-03-01 DIAGNOSIS — B9689 Other specified bacterial agents as the cause of diseases classified elsewhere: Secondary | ICD-10-CM

## 2022-03-01 MED ORDER — METRONIDAZOLE 500 MG PO TABS: 500.0000 mg | ORAL_TABLET | Freq: Two times a day (BID) | ORAL | 0 refills | Status: DC

## 2022-03-26 ENCOUNTER — Telehealth: Payer: Self-pay

## 2022-03-26 NOTE — Telephone Encounter (Signed)
Patient called stating that she has been bleeding since getting her Mirena IUD placed last month. Describes bleeding as spotty sometimes and then some small blood clots. Patient informed that some irregular bleeding can be normal after IUD placement. Patient needs to be scheduled for a 1 month IUD check. Appt scheduled.

## 2022-03-27 ENCOUNTER — Ambulatory Visit: Payer: Medicaid Other | Admitting: Obstetrics

## 2022-04-16 ENCOUNTER — Ambulatory Visit (INDEPENDENT_AMBULATORY_CARE_PROVIDER_SITE_OTHER): Payer: Medicaid Other | Admitting: Family Medicine

## 2022-04-16 ENCOUNTER — Encounter: Payer: Self-pay | Admitting: Family Medicine

## 2022-04-16 VITALS — BP 130/83 | HR 83 | Wt 266.0 lb

## 2022-04-16 DIAGNOSIS — Z30431 Encounter for routine checking of intrauterine contraceptive device: Secondary | ICD-10-CM | POA: Diagnosis not present

## 2022-04-16 NOTE — Progress Notes (Signed)
Pt is in the office to follow up for IUD string check, inserted on 02/26/22.

## 2022-04-16 NOTE — Progress Notes (Signed)
  Subjective:     Patient ID: Annette Wood, female   DOB: [birth year], 31 y.o.   MRN: [id number]  30 y.o. here to follow up on IUD. Inserted on 10/10. Here for a string check. She is doing well. No concerns with IUD. Has not had irregular bleeding. No abnormal vaginal discharge.    ROS As in HPI above    Objective:   Physical Exam Exam conducted with a chaperone present.  Constitutional:      General: She is not in acute distress.    Appearance: Normal appearance.  Genitourinary:    Exam position: Supine.     Labia:        Right: No lesion.        Left: No lesion.      Vagina: Normal. No vaginal discharge.     Cervix: No cervical motion tenderness.     Adnexa: Right adnexa normal and left adnexa normal.     Comments: IUD strings visible protruding through cervical os. Length appropriate. Skin:    General: Skin is warm and dry.  Neurological:     Mental Status: She is alert.       Assessment:     Encounter for routine checking of intrauterine contraceptive device (IUD)    Plan:     IUD in place, strings look good. No concerns today. Follow up for annual women exam and next pap smear in 11/2023.

## 2022-04-18 ENCOUNTER — Other Ambulatory Visit: Payer: Self-pay | Admitting: Emergency Medicine

## 2022-04-18 MED ORDER — METRONIDAZOLE 500 MG PO TABS: 500.0000 mg | ORAL_TABLET | Freq: Two times a day (BID) | ORAL | 0 refills | Status: DC

## 2022-04-18 NOTE — Progress Notes (Signed)
Rx for reported BV symptoms.

## 2022-08-17 IMAGING — US US OB < 14 WEEKS - US OB TV
1 series · 15 of 28 positions shown · non-contrast
Comparison: Ultrasound November 12, 2012.

CLINICAL DATA: Pelvic pain.  Spotting.

EXAM:
OBSTETRIC <14 WK US AND TRANSVAGINAL OB US
TECHNIQUE: Both transabdominal and transvaginal ultrasound examinations were
performed for complete evaluation of the gestation as well as the
maternal uterus, adnexal regions, and pelvic cul-de-sac.
Transvaginal technique was performed to assess early pregnancy.

[Series 1: us ob < 14 weeks - us ob tv · 15 of 51 slices shown]
[im 1/51]
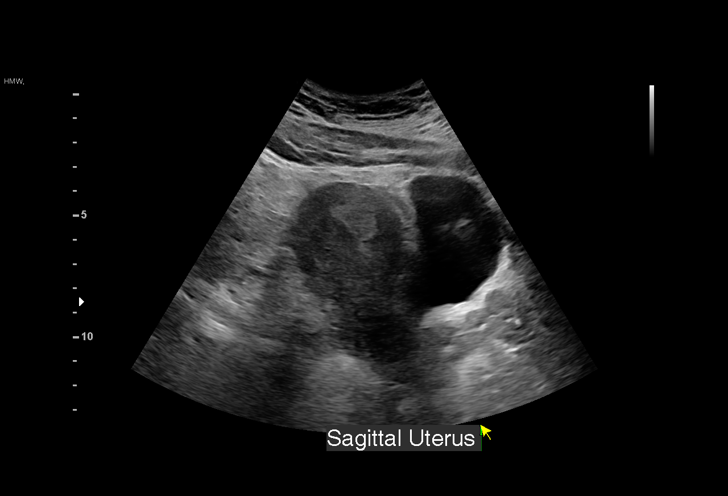
[im 4/51]
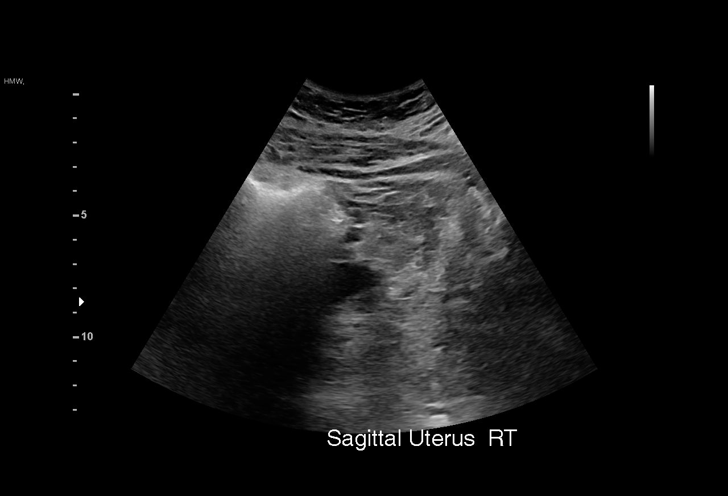
[im 8/51]
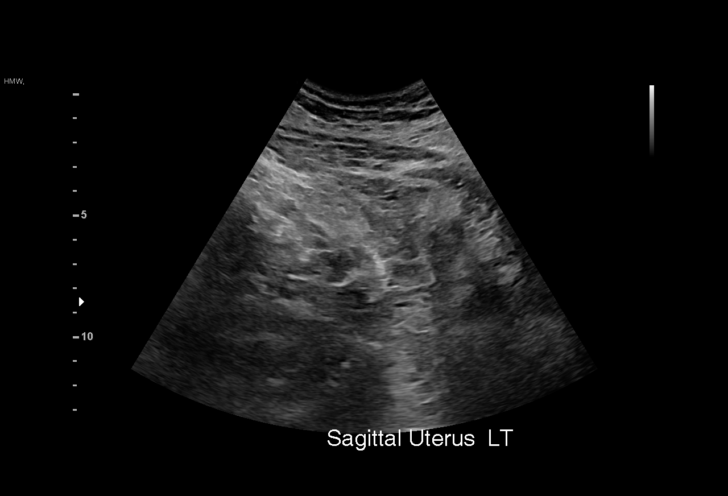
[im 12/51]
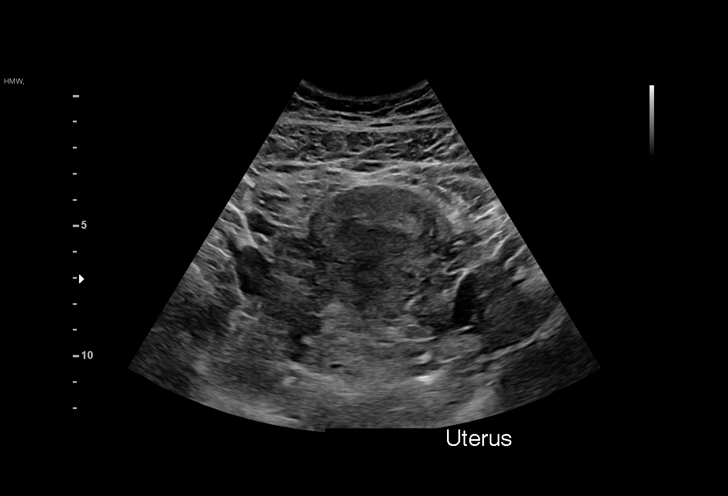
[im 15/51]
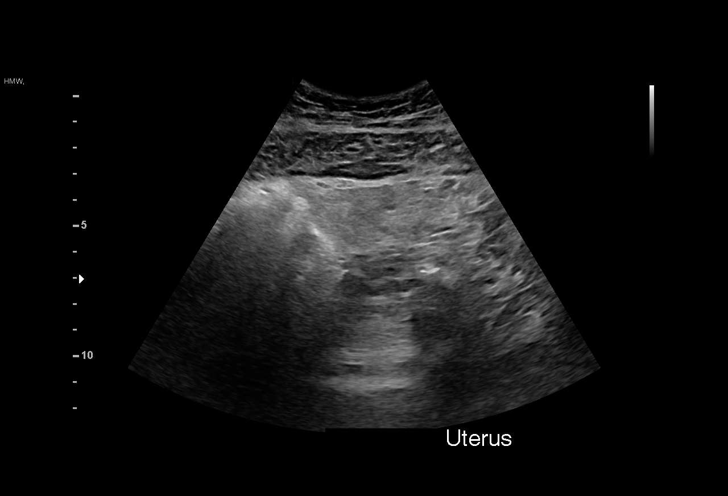
[im 19/51]
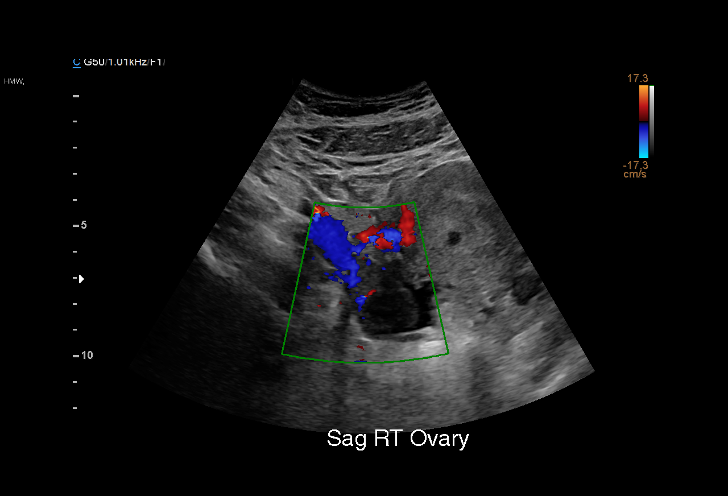
[im 23/51]
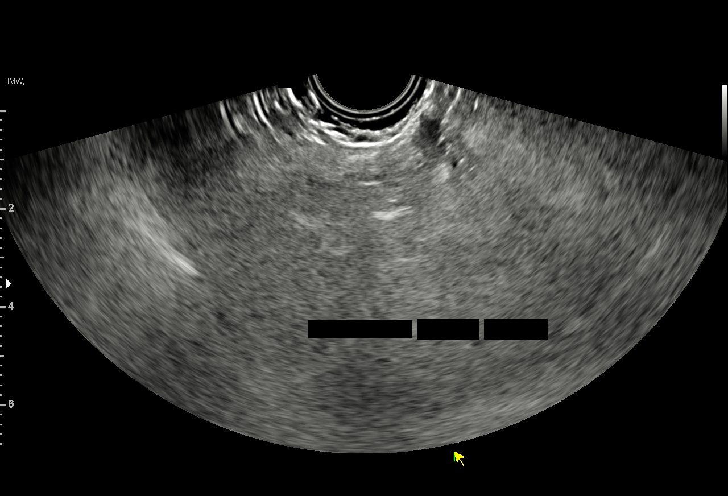
[im 26/51]
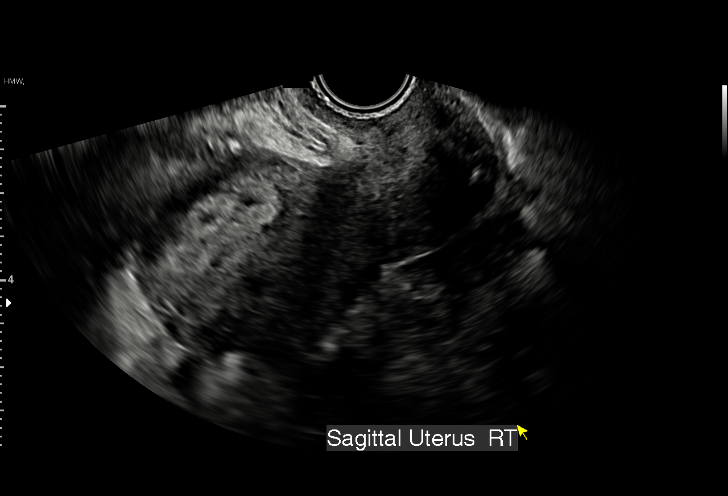
[im 28/51]
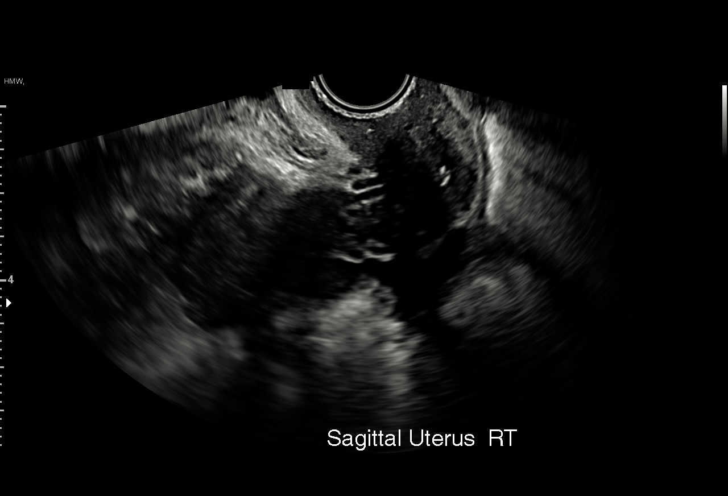
[im 32/51]
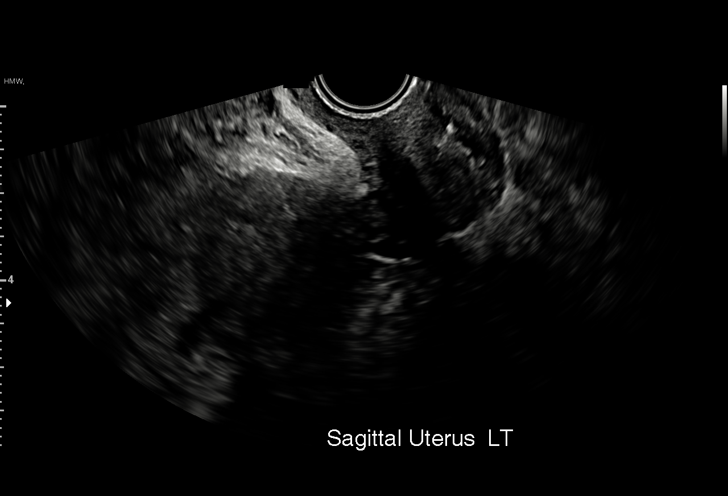
[im 36/51]
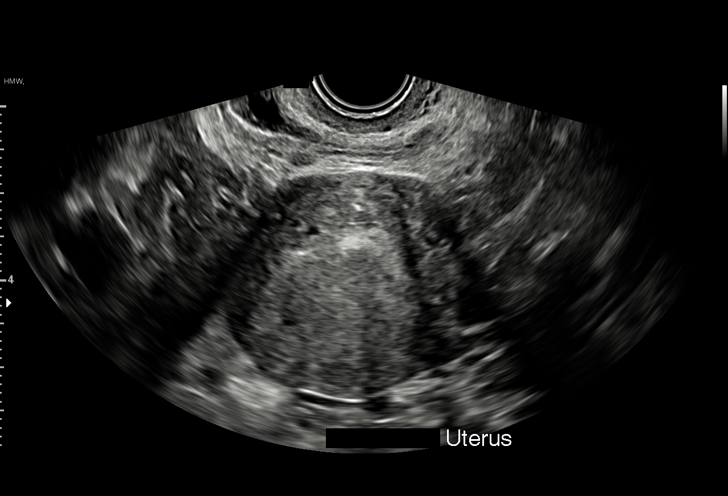
[im 39/51]
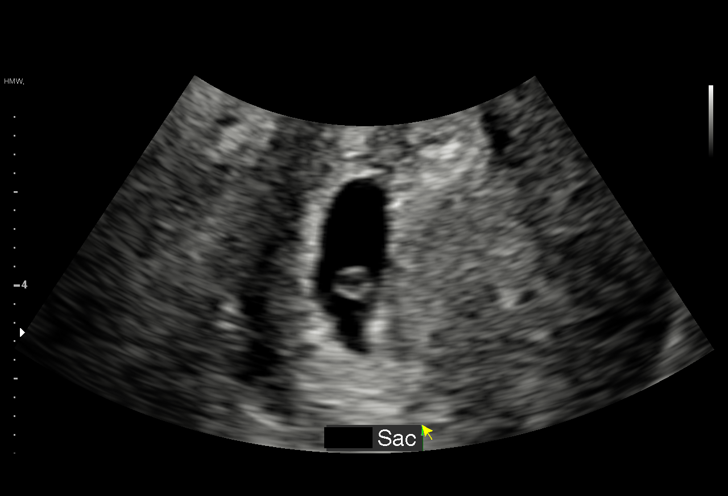
[im 43/51]
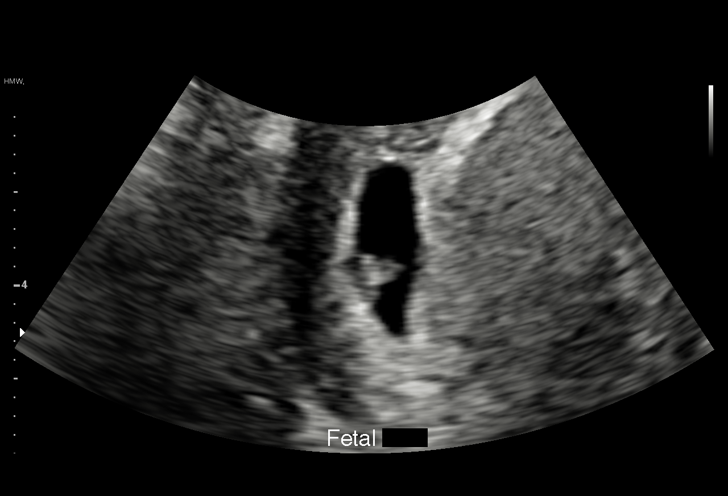
[im 47/51]
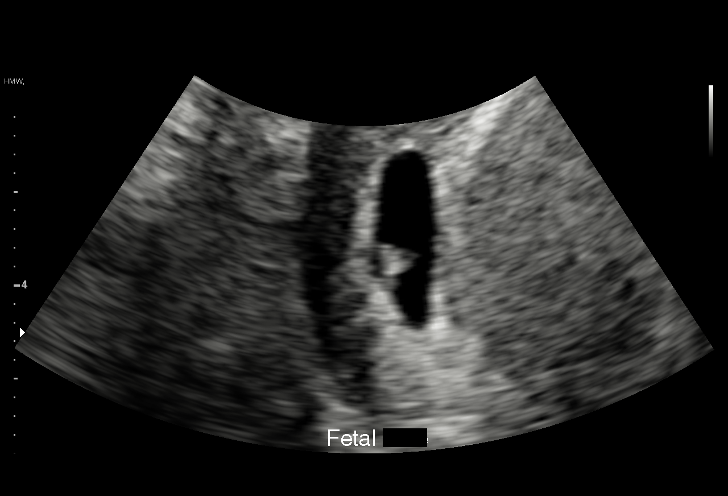
[im 51/51]
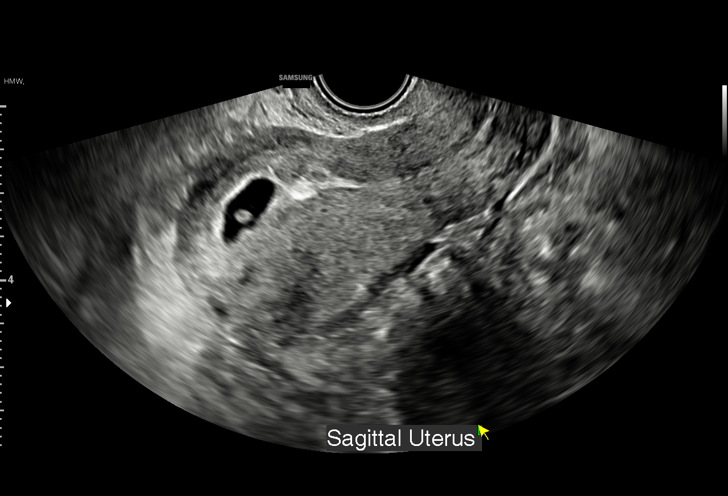

[15 of 28 positions shown; findings below may reference images not displayed]

FINDINGS: Incomplete exam.  Transvaginal exam ended early due to patient pain.

Intrauterine gestational sac: Single

Yolk sac:  Visualized.

Embryo:  Visualized.

Cardiac Activity: Visualized.

Heart Rate: 116 bpm

CRL:  5.0 mm   6 w   1 d                  US EDC: 04/07/2022

Subchorionic hemorrhage:  Small subchorionic hemorrhage.

Maternal uterus/adnexae: Unremarkable right adnexa. Left ovary is
not visualized.
IMPRESSION: 1. Single intrauterine gestation with small subchorionic hemorrhage,
as detailed above.
2. Incomplete exam due to patient intolerance/pain.

## 2022-08-20 ENCOUNTER — Ambulatory Visit: Payer: Medicaid Other | Admitting: Obstetrics & Gynecology

## 2022-08-26 ENCOUNTER — Other Ambulatory Visit: Payer: Self-pay | Admitting: Obstetrics and Gynecology

## 2022-08-28 ENCOUNTER — Encounter: Payer: Self-pay | Admitting: Obstetrics and Gynecology

## 2022-08-28 ENCOUNTER — Other Ambulatory Visit (HOSPITAL_COMMUNITY)
Admission: RE | Admit: 2022-08-28 | Discharge: 2022-08-28 | Disposition: A | Discharge status: Home / Self Care | Payer: Medicaid Other | Source: Ambulatory Visit | Attending: Obstetrics & Gynecology | Admitting: Obstetrics & Gynecology

## 2022-08-28 ENCOUNTER — Ambulatory Visit: Payer: Medicaid Other | Admitting: Obstetrics and Gynecology

## 2022-08-28 VITALS — BP 127/89 | HR 76 | Ht 63.0 in | Wt 274.0 lb

## 2022-08-28 DIAGNOSIS — Z1339 Encounter for screening examination for other mental health and behavioral disorders: Secondary | ICD-10-CM

## 2022-08-28 DIAGNOSIS — Z202 Contact with and (suspected) exposure to infections with a predominantly sexual mode of transmission: Secondary | ICD-10-CM

## 2022-08-28 DIAGNOSIS — Z30432 Encounter for removal of intrauterine contraceptive device: Secondary | ICD-10-CM

## 2022-08-28 DIAGNOSIS — Z01419 Encounter for gynecological examination (general) (routine) without abnormal findings: Secondary | ICD-10-CM | POA: Insufficient documentation

## 2022-08-28 HISTORY — DX: Encounter for gynecological examination (general) (routine) without abnormal findings: Z01.419

## 2022-08-28 HISTORY — DX: Encounter for removal of intrauterine contraceptive device: Z30.432

## 2022-08-28 NOTE — Progress Notes (Signed)
32 y.o GYN presents for AEX/PAP/STD screening and removal of IUD.  Pt is planning to become pregnant, she was advised to start taking PNV from now. Reports no concerns today.

## 2022-08-28 NOTE — Progress Notes (Signed)
Annette Wood is a 32 y.o. [phone] female here for a routine annual gynecologic exam.  Current complaints: Desires STD test and removal of IUD.   Denies abnormal vaginal bleeding, discharge, pelvic pain, problems with intercourse or other gynecologic concerns.    Gynecologic History No LMP recorded. (Menstrual status: IUD). Contraception: IUD Last Pap: 2022. Results were: normal Last mammogram: NA.   Obstetric History OB History  Gravida Para Term Preterm AB Living  5 3 3  0 2 3  SAB IAB Ectopic Multiple Live Births  1 1 0 0 3    # Outcome Date GA Lbr Len/2nd Weight Sex Delivery Anes PTL Lv  5 SAB 08/2021          4 IAB 2020          3 Term 04/01/13 [redacted]w[redacted]d 05:40 / 00:15 7 lb 1.6 oz (3.221 kg) F Vag-Spont EPI  LIV  2 Term 09/20/09 [redacted]w[redacted]d  7 lb 8 oz (3.402 kg) F Vag-Spont EPI N LIV  1 Term 06/09/08 [redacted]w[redacted]d  6 lb 3 oz (2.807 kg) M Vag-Spont None N LIV     Birth Comments: delivered in car    Past Medical History:  Diagnosis Date   Chlamydia    Gallstones    Gastroesophageal reflux    Gonorrhea    Panic attack     Past Surgical History:  Procedure Laterality Date   CHOLECYSTECTOMY N/A 11/24/2012   Procedure: LAPAROSCOPIC CHOLECYSTECTOMY WITH INTRAOPERATIVE CHOLANGIOGRAM;  Surgeon: Mariella Saa, MD;  Location: [facility];  Service: General;  Laterality: N/A;   CHOLECYSTECTOMY     DILATION AND EVACUATION N/A 09/06/2021   Procedure: DILATATION AND EVACUATION;  Surgeon: Warden Fillers, MD;  Location: [facility];  Service: Gynecology;  Laterality: N/A;    No current outpatient medications on file prior to visit.   No current facility-administered medications on file prior to visit.    No Known Allergies  Social History   Socioeconomic History   Marital status: Single    Spouse name: Not on file   Number of children: Not on file   Years of education: Not on file   Highest education level: Not on file  Occupational History   Not on file  Tobacco Use   Smoking status:  Never   Smokeless tobacco: Never  Vaping Use   Vaping Use: Never used  Substance and Sexual Activity   Alcohol use: Yes    Comment: rarely   Drug use: No   Sexual activity: Yes    Partners: Male    Birth control/protection: None  Other Topics Concern   Not on file  Social History Narrative   Not on file   Social Determinants of Health   Financial Resource Strain: Not on file  Food Insecurity: Not on file  Transportation Needs: Not on file  Physical Activity: Not on file  Stress: Not on file  Social Connections: Not on file  Intimate Partner Violence: Not on file    Family History  Problem Relation Age of Onset   Diabetes Maternal Grandmother    Lupus Maternal Grandmother     The following portions of the patient's history were reviewed and updated as appropriate: allergies, current medications, past family history, past medical history, past social history, past surgical history and problem list.  Review of Systems Pertinent items are noted in HPI.   Objective:  BP 127/89   Pulse 76   Ht 5\' 3"  (1.6 m)   Wt 274 lb (124.3  kg)   BMI 48.54 kg/m  Chaperone present CONSTITUTIONAL: Well-developed, well-nourished female in no acute distress.  HENT:  Normocephalic, atraumatic, External right and left ear normal. Oropharynx is clear and moist EYES: Conjunctivae and EOM are normal. Pupils are equal, round, and reactive to light. No scleral icterus.  NECK: Normal range of motion, supple, no masses.  Normal thyroid.  SKIN: Skin is warm and dry. No rash noted. Not diaphoretic. No erythema. No pallor. NEUROLGIC: Alert and oriented to person, place, and time. Normal reflexes, muscle tone coordination. No cranial nerve deficit noted. PSYCHIATRIC: Normal mood and affect. Normal behavior. Normal judgment and thought content. CARDIOVASCULAR: Normal heart rate noted, regular rhythm RESPIRATORY: Clear to auscultation bilaterally. Effort and breath sounds normal, no problems with  respiration noted. BREASTS: SDeferred ABDOMEN: Soft, normal bowel sounds, no distention noted.  No tenderness, rebound or guarding.  PELVIC: Normal appearing external genitalia; normal appearing vaginal mucosa and cervix.  No abnormal discharge noted.  Pap smear obtained.  Normal uterine size, no other palpable masses, no uterine or adnexal tenderness. MUSCULOSKELETAL: Normal range of motion. No tenderness.  No cyanosis, clubbing, or edema.  2+ distal pulses.        GYNECOLOGY CLINIC PROCEDURE NOTE  IUD Removal  Patient identified, informed consent performed, consent signed.  Patient was in the dorsal lithotomy position, normal external genitalia was noted.  A speculum was placed in the patient's vagina, normal discharge was noted, no lesions. The cervix was visualized, no lesions, no abnormal discharge.  The strings of the IUD were grasped and pulled using ring forceps. The IUD was removed in its entirety.   Patient tolerated the procedure well.    Patient plans for pregnancy soon and she was told to avoid teratogens, take PNV and folic acid.  Routine preventative health maintenance measures emphasized.     Assessment:  Annual gynecologic examination with pap smear STD testing IUD removal Plan:  Will follow up results of pap smear and manage accordingly F/U test results as indicated Routine preventative health maintenance measures emphasized. Please refer to After Visit Summary for other counseling recommendations.    Hermina Staggers, MD, FACOG Attending Obstetrician & Gynecologist Center for [facility], [facility]

## 2022-08-30 LAB — CERVICOVAGINAL ANCILLARY ONLY
Bacterial Vaginitis (gardnerella): POSITIVE — AB
Candida Glabrata: NEGATIVE
Candida Vaginitis: NEGATIVE
Chlamydia: NEGATIVE
Comment: NEGATIVE
Comment: NEGATIVE
Comment: NEGATIVE
Comment: NEGATIVE
Comment: NEGATIVE
Comment: NORMAL
Neisseria Gonorrhea: NEGATIVE
Trichomonas: NEGATIVE

## 2022-09-02 LAB — CYTOLOGY - PAP
Comment: NEGATIVE
Diagnosis: NEGATIVE
High risk HPV: NEGATIVE

## 2022-09-03 ENCOUNTER — Other Ambulatory Visit: Payer: Self-pay | Admitting: Emergency Medicine

## 2022-09-03 MED ORDER — METRONIDAZOLE 500 MG PO TABS: 500.0000 mg | ORAL_TABLET | Freq: Two times a day (BID) | ORAL | 0 refills | Status: DC

## 2022-09-03 NOTE — Progress Notes (Signed)
Rx for BV per protocol. 

## 2022-09-05 IMAGING — US US OB TRANSVAGINAL
1 series · 15 of 28 positions shown · non-contrast
Comparison: Obstetrical ultrasound 08/13/2021

CLINICAL DATA: Passing blood clots.

EXAM:
TRANSVAGINAL OB ULTRASOUND
TECHNIQUE: Transvaginal ultrasound was performed for complete evaluation of the
gestation as well as the maternal uterus, adnexal regions, and
pelvic cul-de-sac.

[Series 1: us ob transvaginal · 33 acquisitions, 15 frames shown]
[im 1/33]
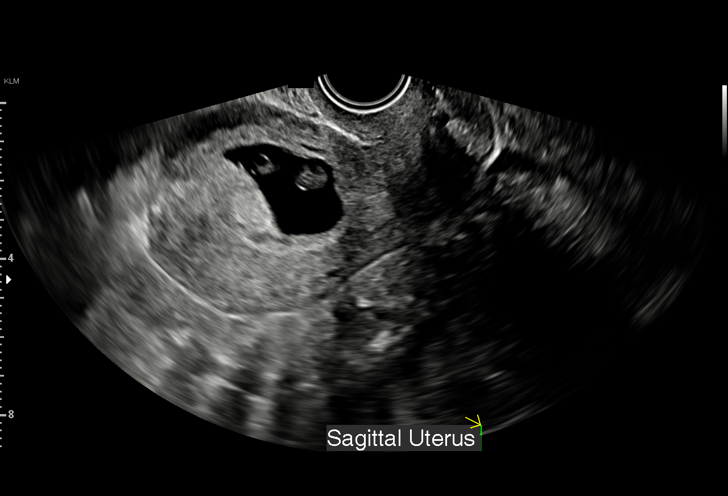
[im 3/33]
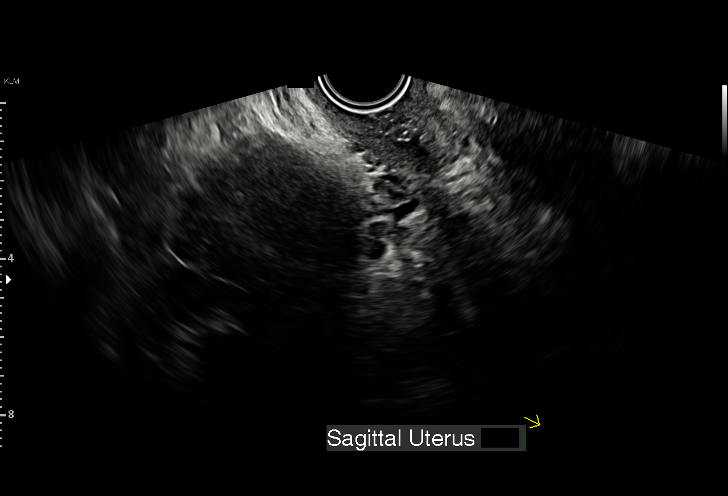
[im 5/33]
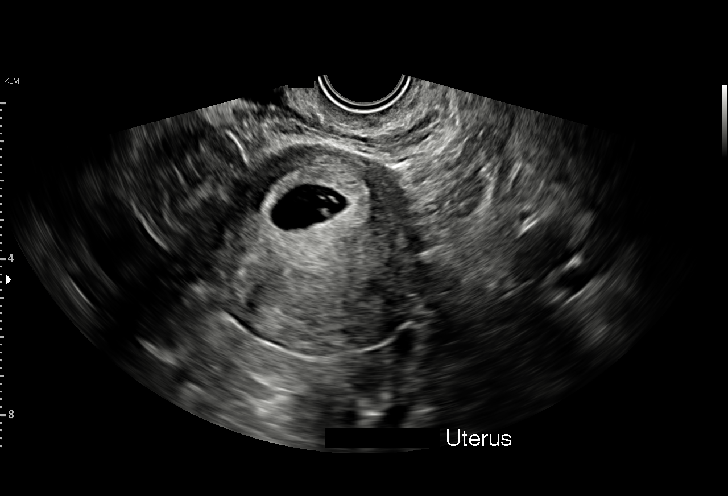
[im 8/33]
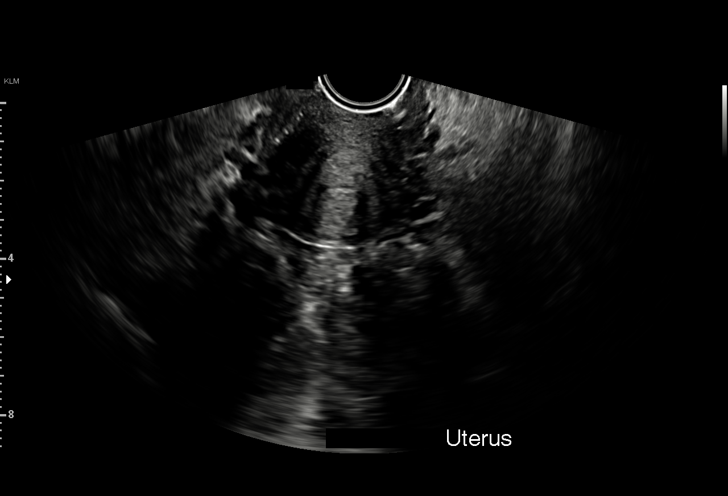
[im 10/33]
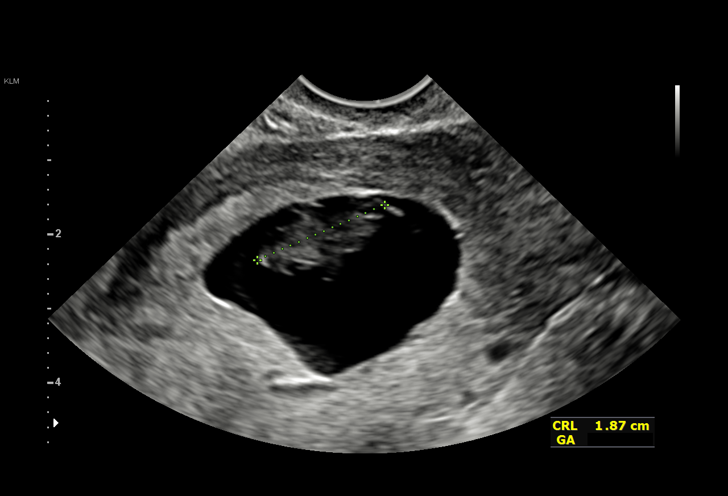
[im 12/33]
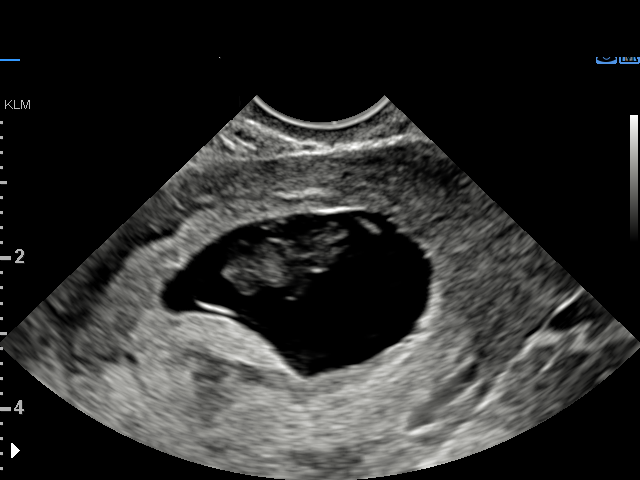
[im 15/33]
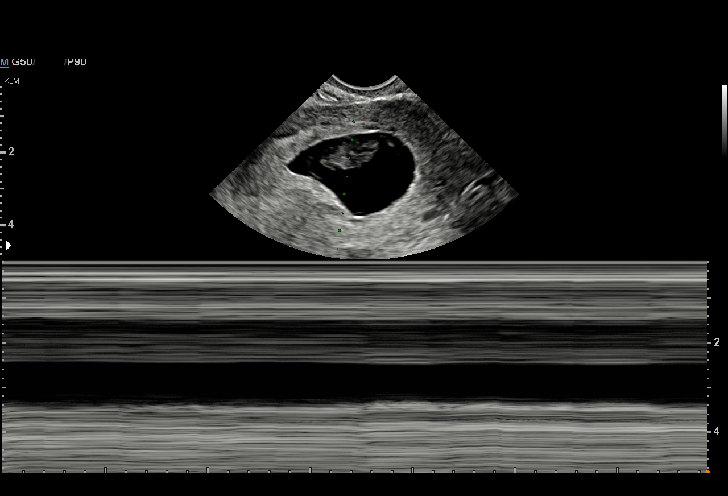
[im 17/33]
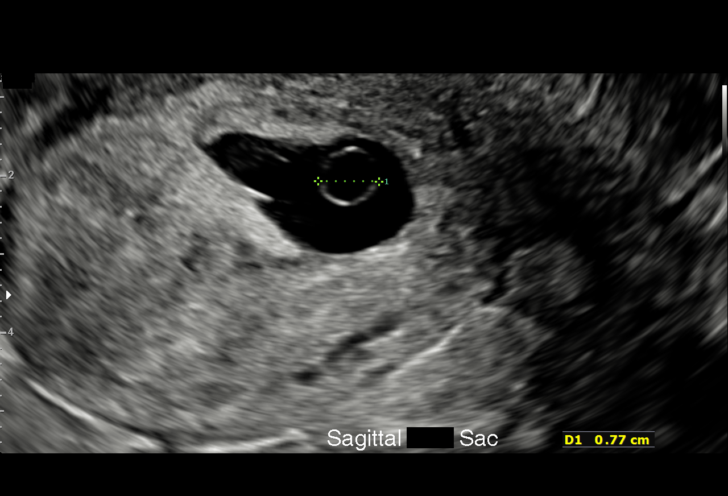
[im 18/33]
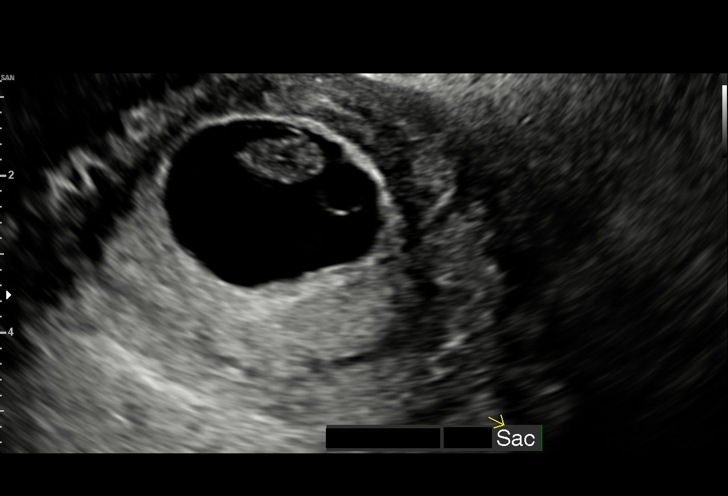
[im 21/33]
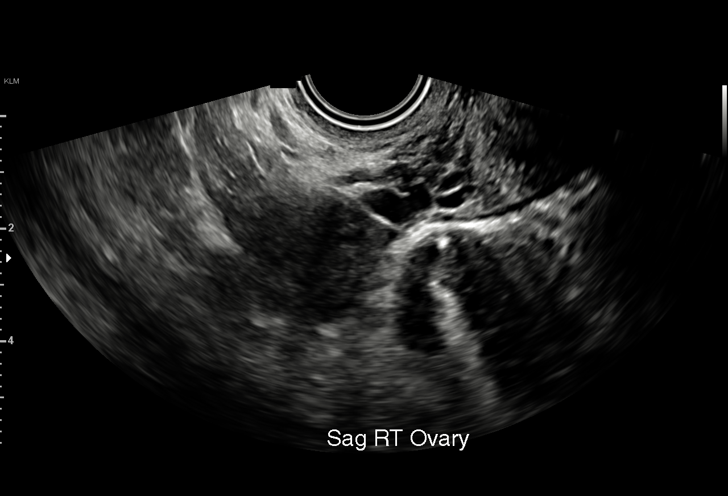
[im 23/33]
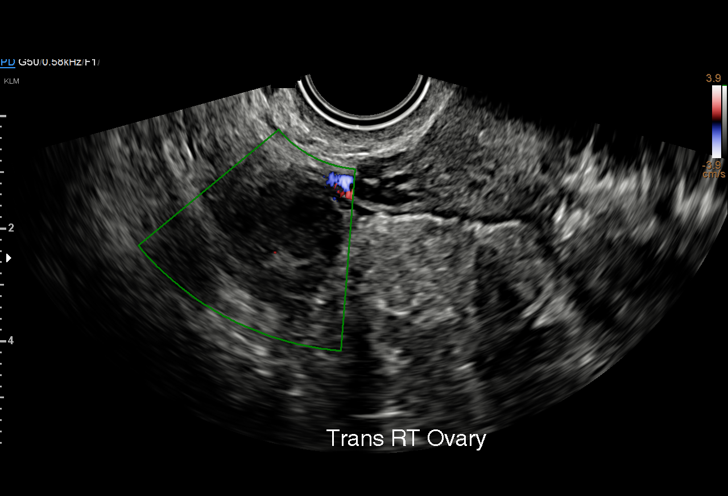
[im 25/33]
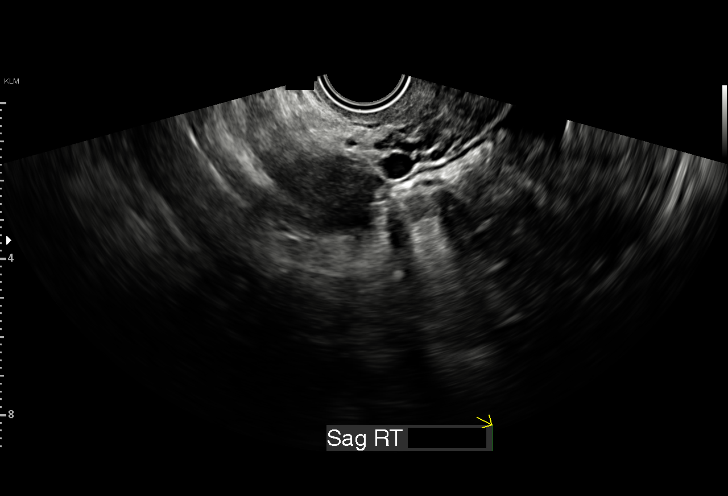
[im 28/33]
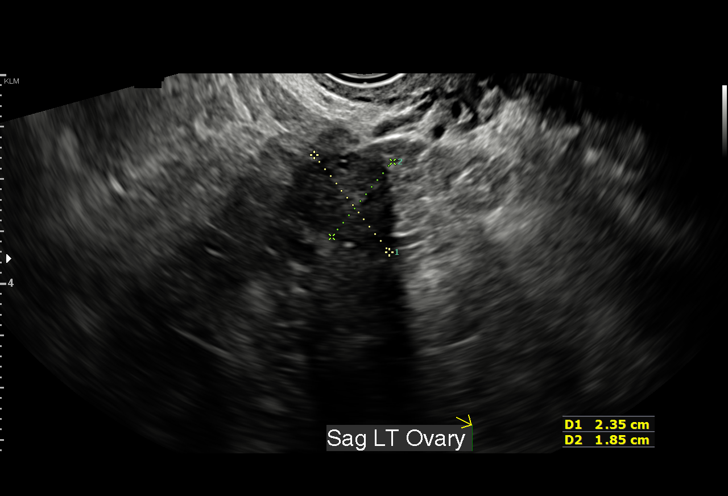
[im 30/33]
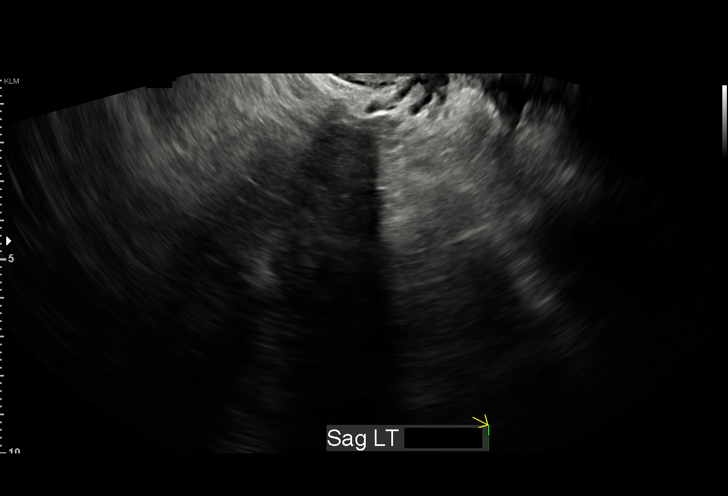
[im 33/33]
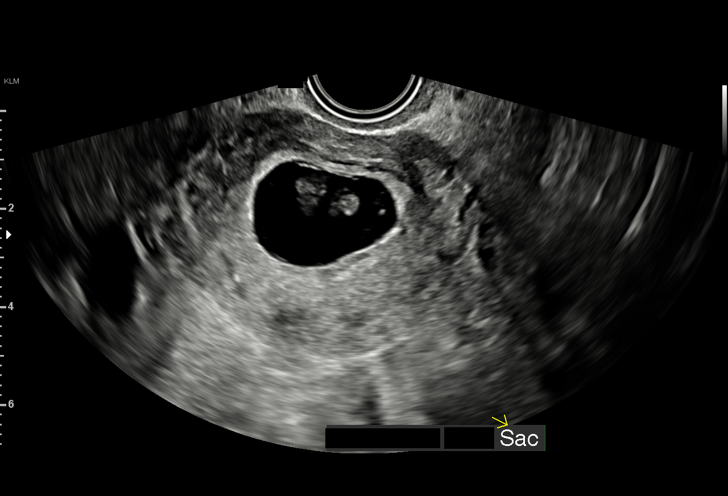

[15 of 28 positions shown; findings below may reference images not displayed]

FINDINGS: Intrauterine gestational sac: Single

Yolk sac:  Visualized.  Enlarged.

Embryo:  Visualized.

Cardiac Activity: Not Visualized.

Heart Rate: 0 bpm

CRL: 18.1 mm 8 w 2 d US EDC: 04/11/2009 increased

Subchorionic hemorrhage:  None visualized.

Maternal uterus/adnexae: Ovaries are within normal limits. No free
fluid identified.
IMPRESSION: 1. Intrauterine fetal demise measuring 8 weeks 2 days by crown-rump
length.

## 2022-09-09 MED ORDER — METRONIDAZOLE 500 MG PO TABS: 500.0000 mg | ORAL_TABLET | Freq: Two times a day (BID) | ORAL | 0 refills | Status: DC

## 2022-10-08 ENCOUNTER — Telehealth: Payer: Medicaid Other | Admitting: Family Medicine

## 2022-10-08 ENCOUNTER — Encounter: Payer: Self-pay | Admitting: Family Medicine

## 2022-10-08 DIAGNOSIS — Z3200 Encounter for pregnancy test, result unknown: Secondary | ICD-10-CM

## 2022-10-08 NOTE — Progress Notes (Signed)
[place]  Needs in person eval for possible pregnancy  Patient acknowledged agreement and understanding of the plan.

## 2022-11-06 ENCOUNTER — Other Ambulatory Visit: Payer: Self-pay | Admitting: Obstetrics

## 2022-11-06 ENCOUNTER — Encounter: Payer: Self-pay | Admitting: Obstetrics

## 2022-11-06 MED ORDER — METRONIDAZOLE 500 MG PO TABS: 500.0000 mg | ORAL_TABLET | Freq: Two times a day (BID) | ORAL | 0 refills | Status: DC

## 2022-11-28 ENCOUNTER — Ambulatory Visit: Payer: Medicaid Other

## 2023-01-01 ENCOUNTER — Encounter: Payer: Self-pay | Admitting: Obstetrics

## 2023-02-18 ENCOUNTER — Other Ambulatory Visit: Payer: Self-pay

## 2023-02-18 DIAGNOSIS — B9689 Other specified bacterial agents as the cause of diseases classified elsewhere: Secondary | ICD-10-CM

## 2023-02-18 MED ORDER — METRONIDAZOLE 500 MG PO TABS
500.0000 mg | ORAL_TABLET | Freq: Two times a day (BID) | ORAL | 0 refills | Status: DC
Start: 2023-02-18 — End: 2023-03-04

## 2023-02-20 ENCOUNTER — Ambulatory Visit: Payer: Medicaid Other

## 2023-03-04 ENCOUNTER — Ambulatory Visit: Payer: Medicaid Other | Admitting: *Deleted

## 2023-03-04 DIAGNOSIS — Z3201 Encounter for pregnancy test, result positive: Secondary | ICD-10-CM | POA: Diagnosis not present

## 2023-03-04 DIAGNOSIS — N912 Amenorrhea, unspecified: Secondary | ICD-10-CM | POA: Diagnosis not present

## 2023-03-04 DIAGNOSIS — Z3200 Encounter for pregnancy test, result unknown: Secondary | ICD-10-CM

## 2023-03-04 LAB — POCT URINE PREGNANCY: Preg Test, Ur: POSITIVE — AB

## 2023-03-04 MED ORDER — VITAFOL ULTRA 29-0.6-0.4-200 MG PO CAPS: 1.0000 | ORAL_CAPSULE | Freq: Every day | ORAL | 11 refills | Status: DC

## 2023-03-04 NOTE — Progress Notes (Signed)
Annette Wood presents today for UPT. She has no unusual complaints.  LMP: 01/31/23 with EDD 11/07/23  OBJECTIVE: Appears well, in no apparent distress.   OB History     Gravida  5   Para  3   Term  3   Preterm  0   AB  2   Living  3      SAB  1   IAB  1   Ectopic  0   Multiple  0   Live Births  3          Home UPT Result:positive In-Office UPT result:positive  I have reviewed the patient's medical, obstetrical, social, and family histories, and medications.   ASSESSMENT: Positive pregnancy test  PLAN Prenatal care to be completed at: [facility] PNV sent to phamacy. Made aware when to seek emergent care.

## 2023-03-14 ENCOUNTER — Inpatient Hospital Stay (HOSPITAL_COMMUNITY): Payer: Medicaid Other

## 2023-03-14 ENCOUNTER — Inpatient Hospital Stay (HOSPITAL_COMMUNITY)
Admission: AD | Admit: 2023-03-14 | Discharge: 2023-03-14 | Disposition: A | Discharge status: Home / Self Care | Payer: Medicaid Other | Attending: Obstetrics & Gynecology | Admitting: Obstetrics & Gynecology

## 2023-03-14 ENCOUNTER — Encounter (HOSPITAL_COMMUNITY): Payer: Self-pay | Admitting: *Deleted

## 2023-03-14 DIAGNOSIS — O09291 Supervision of pregnancy with other poor reproductive or obstetric history, first trimester: Secondary | ICD-10-CM | POA: Diagnosis not present

## 2023-03-14 DIAGNOSIS — Z3A01 Less than 8 weeks gestation of pregnancy: Secondary | ICD-10-CM | POA: Diagnosis not present

## 2023-03-14 DIAGNOSIS — O209 Hemorrhage in early pregnancy, unspecified: Secondary | ICD-10-CM | POA: Diagnosis not present

## 2023-03-14 DIAGNOSIS — O99611 Diseases of the digestive system complicating pregnancy, first trimester: Secondary | ICD-10-CM | POA: Diagnosis present

## 2023-03-14 DIAGNOSIS — Z3491 Encounter for supervision of normal pregnancy, unspecified, first trimester: Secondary | ICD-10-CM

## 2023-03-14 LAB — CBC
HCT: 37.7 % (ref 36.0–46.0)
Hemoglobin: 12.7 g/dL (ref 12.0–15.0)
MCH: 29.4 pg (ref 26.0–34.0)
MCHC: 33.7 g/dL (ref 30.0–36.0)
MCV: 87.3 fL (ref 80.0–100.0)
Platelets: 339 10*3/uL (ref 150–400)
RBC: 4.32 MIL/uL (ref 3.87–5.11)
RDW: 13.5 % (ref 11.5–15.5)
WBC: 7.5 10*3/uL (ref 4.0–10.5)
nRBC: 0 % (ref 0.0–0.2)

## 2023-03-14 LAB — URINALYSIS, ROUTINE W REFLEX MICROSCOPIC
Bacteria, UA: NONE SEEN
Bilirubin Urine: NEGATIVE
Glucose, UA: NEGATIVE mg/dL
Ketones, ur: NEGATIVE mg/dL
Leukocytes,Ua: NEGATIVE
Nitrite: NEGATIVE
Protein, ur: NEGATIVE mg/dL
Specific Gravity, Urine: 1.013 (ref 1.005–1.030)
pH: 7 (ref 5.0–8.0)

## 2023-03-14 LAB — WET PREP, GENITAL
Sperm: NONE SEEN
Trich, Wet Prep: NONE SEEN
WBC, Wet Prep HPF POC: <10 (ref <10)
Yeast Wet Prep HPF POC: NONE SEEN

## 2023-03-14 LAB — HCG, QUANTITATIVE, PREGNANCY: hCG, Beta Chain, Quant, S: 16850 m[IU]/mL — ABNORMAL HIGH (ref <5)

## 2023-03-14 NOTE — MAU Provider Note (Signed)
Chief Complaint: Vaginal Bleeding   None     SUBJECTIVE HPI: Annette Wood is a 32 y.o. [id number] at [redacted]w[redacted]d by LMP who presents to maternity admissions reporting onset of light bleeding but with small clots this morning. She saw bleeding x 2 in the bathroom, but none the last time she went.  There are no other s/sx. She had a miscarriage 08/2021 and is worried about that today.   HPI  Past Medical History:  Diagnosis Date   Chlamydia    Gallstones    Gastroesophageal reflux    Gonorrhea    Panic attack    Past Surgical History:  Procedure Laterality Date   CHOLECYSTECTOMY N/A 11/24/2012   Procedure: LAPAROSCOPIC CHOLECYSTECTOMY WITH INTRAOPERATIVE CHOLANGIOGRAM;  Surgeon: Mariella Saa, MD;  Location: [facility];  Service: General;  Laterality: N/A;   CHOLECYSTECTOMY     DILATION AND EVACUATION N/A 09/06/2021   Procedure: DILATATION AND EVACUATION;  Surgeon: Warden Fillers, MD;  Location: [facility];  Service: Gynecology;  Laterality: N/A;   THERAPEUTIC ABORTION     Social History   Socioeconomic History   Marital status: Single    Spouse name: Not on file   Number of children: Not on file   Years of education: Not on file   Highest education level: Not on file  Occupational History   Not on file  Tobacco Use   Smoking status: Never   Smokeless tobacco: Never  Vaping Use   Vaping status: Never Used  Substance and Sexual Activity   Alcohol use: Not Currently    Comment: rarely   Drug use: No   Sexual activity: Yes    Partners: Male    Birth control/protection: None  Other Topics Concern   Not on file  Social History Narrative   Not on file   Social Determinants of Health   Financial Resource Strain: Not on file  Food Insecurity: Not on file  Transportation Needs: Not on file  Physical Activity: Not on file  Stress: Not on file  Social Connections: Not on file  Intimate Partner Violence: Not on file   No current facility-administered medications on file  prior to encounter.   Current Outpatient Medications on File Prior to Encounter  Medication Sig Dispense Refill   Prenat-Fe Poly-Methfol-FA-DHA (VITAFOL ULTRA) 29-0.6-0.4-200 MG CAPS Take 1 capsule by mouth daily. 30 capsule 11   No Known Allergies  ROS:  Review of Systems  Constitutional:  Negative for chills, fatigue and fever.  Respiratory:  Negative for shortness of breath.   Cardiovascular:  Negative for chest pain.  Gastrointestinal:  Negative for abdominal pain, nausea and vomiting.  Genitourinary:  Positive for vaginal bleeding. Negative for difficulty urinating, dysuria, flank pain, pelvic pain, vaginal discharge and vaginal pain.  Neurological:  Negative for dizziness and headaches.  Psychiatric/Behavioral: Negative.       I have reviewed patient's Past Medical Hx, Surgical Hx, Family Hx, Social Hx, medications and allergies.   Physical Exam  Patient Vitals for the past 24 hrs:  BP Temp Temp src Pulse Resp SpO2 Height Weight  03/14/23 1032 (!) 142/89 98.2 F (36.8 C) Oral 82 18 100 % 5\' 3"  (1.6 m) 122.2 kg   Constitutional: Well-developed, well-nourished female in no acute distress.  Cardiovascular: normal rate Respiratory: normal effort GI: Abd soft, non-tender. Pos BS x 4 MS: Extremities nontender, no edema, normal ROM Neurologic: Alert and oriented x 4.  GU: Neg CVAT.  PELVIC EXAM: self swabs collected  LAB RESULTS Results for orders placed or performed during the hospital encounter of 03/14/23 (from the past 24 hour(s))  Urinalysis, Routine w reflex microscopic -Urine, Clean Catch     Status: Abnormal   Collection Time: 03/14/23 10:53 AM  Result Value Ref Range   Color, Urine YELLOW YELLOW   APPearance CLEAR CLEAR   Specific Gravity, Urine 1.013 1.005 - 1.030   pH 7.0 5.0 - 8.0   Glucose, UA NEGATIVE NEGATIVE mg/dL   Hgb urine dipstick MODERATE (A) NEGATIVE   Bilirubin Urine NEGATIVE NEGATIVE   Ketones, ur NEGATIVE NEGATIVE mg/dL   Protein, ur  NEGATIVE NEGATIVE mg/dL   Nitrite NEGATIVE NEGATIVE   Leukocytes,Ua NEGATIVE NEGATIVE   RBC / HPF 0-5 0 - 5 RBC/hpf   WBC, UA 0-5 0 - 5 WBC/hpf   Bacteria, UA NONE SEEN NONE SEEN   Squamous Epithelial / HPF 0-5 0 - 5 /HPF  CBC     Status: None   Collection Time: 03/14/23 11:07 AM  Result Value Ref Range   WBC 7.5 4.0 - 10.5 K/uL   RBC 4.32 3.87 - 5.11 MIL/uL   Hemoglobin 12.7 12.0 - 15.0 g/dL   HCT 09.8 11.9 - 14.7 %   MCV 87.3 80.0 - 100.0 fL   MCH 29.4 26.0 - 34.0 pg   MCHC 33.7 30.0 - 36.0 g/dL   RDW 82.9 56.2 - 13.0 %   Platelets 339 150 - 400 K/uL   nRBC 0.0 0.0 - 0.2 %  hCG, quantitative, pregnancy     Status: Abnormal   Collection Time: 03/14/23 11:07 AM  Result Value Ref Range   hCG, Beta Chain, Quant, S 16,850 (H) <5 mIU/mL  Wet prep, genital     Status: Abnormal   Collection Time: 03/14/23 12:09 PM  Result Value Ref Range   Yeast Wet Prep HPF POC NONE SEEN NONE SEEN   Trich, Wet Prep NONE SEEN NONE SEEN   Clue Cells Wet Prep HPF POC PRESENT (A) NONE SEEN   WBC, Wet Prep HPF POC <10 <10   Sperm NONE SEEN        IMAGING US OB LESS THAN 14 WEEKS WITH OB TRANSVAGINAL  Result Date: 03/14/2023 CLINICAL DATA:  Bleeding EXAM: OBSTETRIC <14 WK ULTRASOUND TECHNIQUE: Transabdominal ultrasound was performed for evaluation of the gestation as well as the maternal uterus and adnexal regions. COMPARISON:  None Available. FINDINGS: Intrauterine gestational sac: Single Yolk sac:  Visualized. Embryo:  Not Visualized. Cardiac Activity: Not Visualized. MSD:  10.6 mm   5 w   6 d Subchorionic hemorrhage:  None visualized. Maternal uterus/adnexae: Normal appearing right ovary. Left ovary is not visualized. Trace free fluid in the pelvis. IMPRESSION: Early intrauterine pregnancy with gestational sac and yolk sac visualized but embryo not yet visualized. Estimated gestational age of [redacted] weeks 6 days. Electronically Signed   By: Allegra Lai M.D.   On: 03/14/2023 12:26    MAU  Management/MDM: Orders Placed This Encounter  Procedures   Wet prep, genital   US OB LESS THAN 14 WEEKS WITH OB TRANSVAGINAL   CBC   hCG, quantitative, pregnancy   Urinalysis, Routine w reflex microscopic -Urine, Clean Catch   Discharge patient    No orders of the defined types were placed in this encounter.   IUP noted on today's [place] with gestational sac and yolk sac.  Viability [place] in 7-10 days, message sent to [facility] to schedule in the office.  Bleeding/return precautions reviewed.   ASSESSMENT 1.  Vaginal bleeding in pregnancy, first trimester   2. [redacted] weeks gestation of pregnancy   3. Normal IUP (intrauterine pregnancy) on prenatal ultrasound, first trimester     PLAN Discharge home Allergies as of 03/14/2023   No Known Allergies      Medication List     TAKE these medications    Vitafol Ultra 29-0.6-0.4-200 MG Caps Take 1 capsule by mouth daily.        Follow-up Information     [facility] for [facility] Follow up in 2 week(s).   Specialty: Obstetrics and Gynecology Why: They will call you with an appointment Contact information: [address], [address] [place] [address] [phone]        [facility] Follow up.   Specialty: Obstetrics and Gynecology Why: As needed, If symptoms worsen Contact information: [address] [place] [address] [phone]                Sharen Counter Certified Nurse-Midwife 03/14/2023  6:41 PM

## 2023-03-14 NOTE — MAU Note (Addendum)
Annette Wood is a 32 y.o. at [redacted]w[redacted]d here in MAU reporting: just woke up, went to the bathroom, wiped and she seen blood. No pain, no recent intercourse.  Pt tearful "I am so scared, been through this before". Had waited a year to try again.  Has not blood work or [place] yet. Only confirmation.  LMP: 9/13 Onset of complaint: this morning Pain score: none Vitals:   03/14/23 1032  BP: (!) 142/89  Pulse: 82  Resp: 18  Temp: 98.2 F (36.8 C)  SpO2: 100%      Lab placed from triage:   Urine and swabs collected   Bpos

## 2023-03-17 ENCOUNTER — Telehealth: Payer: Self-pay

## 2023-03-17 ENCOUNTER — Other Ambulatory Visit: Payer: Self-pay | Admitting: Advanced Practice Midwife

## 2023-03-17 LAB — GC/CHLAMYDIA PROBE AMP (~~LOC~~) NOT AT ARMC
Chlamydia: NEGATIVE
Comment: NEGATIVE
Comment: NORMAL
Neisseria Gonorrhea: NEGATIVE

## 2023-03-17 NOTE — Telephone Encounter (Signed)
Patient called stating that she had some light pink vaginal discharge that she notice this morning. Has not noticed anymore. Patient advised to continue to monitor sxs and to report to MAU if bleeding increases or she begin to have bad period like cramps.  Patient verbalized understanding

## 2023-03-31 ENCOUNTER — Other Ambulatory Visit (INDEPENDENT_AMBULATORY_CARE_PROVIDER_SITE_OTHER): Payer: Medicaid Other

## 2023-03-31 ENCOUNTER — Ambulatory Visit (INDEPENDENT_AMBULATORY_CARE_PROVIDER_SITE_OTHER): Payer: Medicaid Other | Admitting: *Deleted

## 2023-03-31 VITALS — BP 123/82 | HR 70 | Wt 272.4 lb

## 2023-03-31 DIAGNOSIS — O0990 Supervision of high risk pregnancy, unspecified, unspecified trimester: Secondary | ICD-10-CM | POA: Diagnosis not present

## 2023-03-31 DIAGNOSIS — O09899 Supervision of other high risk pregnancies, unspecified trimester: Secondary | ICD-10-CM

## 2023-03-31 DIAGNOSIS — Z3481 Encounter for supervision of other normal pregnancy, first trimester: Secondary | ICD-10-CM | POA: Diagnosis not present

## 2023-03-31 DIAGNOSIS — O0991 Supervision of high risk pregnancy, unspecified, first trimester: Secondary | ICD-10-CM

## 2023-03-31 DIAGNOSIS — O3680X0 Pregnancy with inconclusive fetal viability, not applicable or unspecified: Secondary | ICD-10-CM

## 2023-03-31 DIAGNOSIS — Z1339 Encounter for screening examination for other mental health and behavioral disorders: Secondary | ICD-10-CM

## 2023-03-31 DIAGNOSIS — Z3A08 8 weeks gestation of pregnancy: Secondary | ICD-10-CM | POA: Diagnosis not present

## 2023-03-31 MED ORDER — BLOOD PRESSURE KIT DEVI: 1.0000 | 0 refills | Status: DC

## 2023-03-31 NOTE — Patient Instructions (Signed)

## 2023-03-31 NOTE — Progress Notes (Cosign Needed Addendum)
New OB Intake  I connected with Annette Wood  on 03/31/23 at  1:10 PM EST by In Person Visit and verified that I am speaking with the correct person using two identifiers. Nurse is located at [facility] and pt is located at [place].  I discussed the limitations, risks, security and privacy concerns of performing an evaluation and management service by telephone and the availability of in person appointments. I also discussed with the patient that there may be a patient responsible charge related to this service. The patient expressed understanding and agreed to proceed.  I explained I am completing New OB Intake today. We discussed EDD of 11/07/2023, by Last Menstrual Period. Pt is [id number]. I reviewed her allergies, medications and Medical/Surgical/OB history.    Patient Active Problem List   Diagnosis Date Noted   Visit for routine gyn exam 08/28/2022   STD exposure 08/28/2022   Encounter for IUD removal 08/28/2022   Hypertension 09/18/2017   Vitamin D deficiency 08/27/2012    Concerns addressed today  Delivery Plans Plans to deliver at [facility]. Discussed the nature of our practice with multiple providers including residents and students. Due to the size of the practice, the delivering provider may not be the same as those providing prenatal care.   Patient is interested in water birth. Offered upcoming OB visit with CNM to discuss further.  MyChart/Babyscripts MyChart access verified. I explained pt will have some visits in office and some virtually. Babyscripts instructions given and order placed. Patient verifies receipt of registration text/e-mail. Account successfully created and app downloaded.  Blood Pressure Cuff/Weight Scale Blood pressure cuff ordered for patient to pick-up from [employer]. Explained after first prenatal appt pt will check weekly and document in Babyscripts. Patient does not have weight scale; patient may purchase if they desire to track weight weekly in  Babyscripts.  Anatomy US Explained first scheduled [place] will be around 19 weeks. Anatomy US scheduled for TBD at TBD.  Interested in [place]? If yes, send referral and doula dot phrase.   Is patient a candidate for Babyscripts Optimization? Yes  First visit review I reviewed new OB appt with patient. Explained pt will be seen by Dr. Lucianne Muss at first visit. Discussed Avelina Laine genetic screening with patient. Requests Panorama and Horizon.. Routine prenatal labs  collected at today's visit.    Last Pap Diagnosis  Date Value Ref Range Status  08/28/2022   Final   - Negative for intraepithelial lesion or malignancy (NILM)    Harrel Lemon, RN 03/31/2023  1:10 PM

## 2023-04-06 LAB — CULTURE, OB URINE

## 2023-04-06 LAB — URINE CULTURE, OB REFLEX

## 2023-04-21 ENCOUNTER — Encounter: Payer: Self-pay | Admitting: Family Medicine

## 2023-04-21 ENCOUNTER — Ambulatory Visit: Payer: Medicaid Other | Admitting: Family Medicine

## 2023-04-21 VITALS — BP 126/81 | HR 91 | Wt 270.8 lb

## 2023-04-21 DIAGNOSIS — O10011 Pre-existing essential hypertension complicating pregnancy, first trimester: Secondary | ICD-10-CM | POA: Diagnosis not present

## 2023-04-21 DIAGNOSIS — O0991 Supervision of high risk pregnancy, unspecified, first trimester: Secondary | ICD-10-CM

## 2023-04-21 DIAGNOSIS — O0990 Supervision of high risk pregnancy, unspecified, unspecified trimester: Secondary | ICD-10-CM

## 2023-04-21 DIAGNOSIS — Z3A11 11 weeks gestation of pregnancy: Secondary | ICD-10-CM

## 2023-04-21 DIAGNOSIS — K219 Gastro-esophageal reflux disease without esophagitis: Secondary | ICD-10-CM

## 2023-04-21 DIAGNOSIS — I10 Essential (primary) hypertension: Secondary | ICD-10-CM

## 2023-04-21 MED ORDER — FAMOTIDINE 20 MG PO TABS: 20.0000 mg | ORAL_TABLET | Freq: Every day | ORAL | 12 refills | Status: DC

## 2023-04-21 MED ORDER — ASPIRIN 81 MG PO TBEC: 162.0000 mg | DELAYED_RELEASE_TABLET | Freq: Every day | ORAL | 12 refills | Status: DC

## 2023-04-21 NOTE — Patient Instructions (Signed)
 Safe Medications in Pregnancy   Acne: Benzoyl Peroxide Salicylic Acid  Backache/Headache: Tylenol: 2 regular strength every 4 hours OR              2 Extra strength every 6 hours  Colds/Coughs/Allergies: Benadryl (alcohol free) 25 mg every 6 hours as needed Breath right strips Claritin Cepacol throat lozenges Chloraseptic throat spray Cold-Eeze- up to three times per day Cough drops, alcohol free Flonase (by prescription only) Guaifenesin Mucinex Robitussin DM (plain only, alcohol free) Saline nasal spray/drops Sudafed (pseudoephedrine) & Actifed ** use only after [redacted] weeks gestation and if you do not have high blood pressure Tylenol Vicks Vaporub Zinc lozenges Zyrtec   Constipation: Colace Ducolax suppositories Fleet enema Glycerin suppositories Metamucil Milk of magnesia Miralax Senokot Smooth move tea  Diarrhea: Kaopectate Imodium A-D  *NO pepto Bismol  Hemorrhoids: Anusol Anusol HC Preparation H Tucks  Indigestion: Tums Maalox Mylanta Cimetidine (Tagamet HB)** preferred in pregnancy Famotidine (Pepcid) Ranitidine (Zantac)  Insomnia: Benadryl (alcohol free) 25mg  every 6 hours as needed Tylenol PM Unisom, no Gelcaps  Leg Cramps: Tums MagGel  Nausea/Vomiting:  Bonine Dramamine Emetrol Ginger extract Sea bands Meclizine   Nausea medication to take during pregnancy:  Unisom (doxylamine succinate 25 mg tablets) Take one tablet daily at bedtime. If symptoms are not adequately controlled, the dose can be increased to a maximum recommended dose of two tablets daily (1/2 tablet in the morning, 1/2 tablet mid-afternoon and one at bedtime). Vitamin B6 100mg  tablets. Take one tablet twice a day (up to 200 mg per day).  Skin Rashes: Aveeno products Benadryl cream or 25mg  every 6 hours as needed Calamine Lotion 1% cortisone cream  Yeast infection: Gyne-lotrimin 7 Monistat 7   **If taking multiple medications, please check labels to avoid  duplicating the same active ingredients **take medication as directed on the label ** Do not exceed 4000 mg of tylenol in 24 hours **Do not take medications that contain aspirin or ibuprofen

## 2023-04-21 NOTE — Progress Notes (Signed)
Pt presents for NOB visit. No concerns.

## 2023-04-21 NOTE — Progress Notes (Signed)
New OB Note  04/21/2023   Clinic: [facility]  Transfer of Care Patient: no  History of Present Illness: Annette Wood is a 32 y.o. [id number] at [redacted]w[redacted]d weeks (dating based on L/5).  Preg complicated by has Vitamin D deficiency; Hypertension; Visit for routine gyn exam; STD exposure; Encounter for IUD removal; and Supervision of high risk pregnancy, antepartum on their problem list.   Support at home: Partner/father of baby, Beckie Busing Any events prior to today's visit: no Her periods were: regular periods every month, had some  She was using no method when she conceived.  She has occasional signs or symptoms of nausea/vomiting of pregnancy. She has Negative signs or symptoms of miscarriage or preterm labor On any medications around the time she conceived/early pregnancy: No   ROS: A 12-point review of systems was performed and negative, except as stated in the above HPI.  OB History  Gravida Para Term Preterm AB Living  6 3 3  0 2 3  SAB IAB Ectopic Multiple Live Births  1 1 0 0 3    # Outcome Date GA Lbr Len/2nd Weight Sex Type Anes PTL Lv  6 Current           5 SAB 08/2021          4 IAB 2020          3 Term 04/01/13 [redacted]w[redacted]d 05:40 / 00:15 7 lb 1.6 oz (3.221 kg) F Vag-Spont EPI  LIV  2 Term 09/20/09 [redacted]w[redacted]d  7 lb 8 oz (3.402 kg) F Vag-Spont EPI N LIV  1 Term 06/09/08 [redacted]w[redacted]d  6 lb 3 oz (2.807 kg) M Vag-Spont None N LIV     Birth Comments: delivered in car    Obstetric Comments  2020 procedure    Any issues with any prior pregnancies: precipitous delivery w first pregnancy Prior children are healthy, doing well, and without any problems or issues: yes History of pap smears: Yes. Last pap smear 08/2022 and results were NILM/negative for hrHPV History of STIs: Yes   Past Medical History: Past Medical History:  Diagnosis Date   Chlamydia    Gallstones    Gastroesophageal reflux    Gonorrhea    Panic attack     Past Surgical History: Past Surgical History:  Procedure Laterality Date    CHOLECYSTECTOMY N/A 11/24/2012   Procedure: LAPAROSCOPIC CHOLECYSTECTOMY WITH INTRAOPERATIVE CHOLANGIOGRAM;  Surgeon: Mariella Saa, MD;  Location: [facility];  Service: General;  Laterality: N/A;   DILATION AND EVACUATION N/A 09/06/2021   Procedure: DILATATION AND EVACUATION;  Surgeon: Warden Fillers, MD;  Location: [facility];  Service: Gynecology;  Laterality: N/A;   THERAPEUTIC ABORTION      Family History:  Family History  Problem Relation Age of Onset   Heart attack Mother    Healthy Father    Diabetes Maternal Grandmother    Lupus Maternal Grandmother    She denies any female cancers, bleeding or blood clotting disorders.  She denies any history of mental retardation, birth defects or genetic disorders in her or the FOB's history  Social History:  Social History   Socioeconomic History   Marital status: Single    Spouse name: Not on file   Number of children: Not on file   Years of education: Not on file   Highest education level: Not on file  Occupational History   Not on file  Tobacco Use   Smoking status: Never   Smokeless tobacco: Never  Vaping Use   Vaping  status: Never Used  Substance and Sexual Activity   Alcohol use: Not Currently    Comment: rarely   Drug use: No   Sexual activity: Yes    Partners: Male    Birth control/protection: None  Other Topics Concern   Not on file  Social History Narrative   Not on file   Social Determinants of Health   Financial Resource Strain: Not on file  Food Insecurity: Not on file  Transportation Needs: Not on file  Physical Activity: Not on file  Stress: Not on file  Social Connections: Not on file  Intimate Partner Violence: Not on file   Any pets in the household: yes -- has one dog and two cats that are indoor/outdoor  Allergy: No Known Allergies  Health Maintenance:  Mammogram Up to Date: not applicable  Current Outpatient Medications: Current Outpatient Medications  Medication Sig Dispense Refill    Blood Pressure Monitoring (BLOOD PRESSURE KIT) DEVI 1 Device by Does not apply route once a week. 1 each 0   Prenat-Fe Poly-Methfol-FA-DHA (VITAFOL ULTRA) 29-0.6-0.4-200 MG CAPS Take 1 capsule by mouth daily. 30 capsule 11   No current facility-administered medications for this visit.     Physical Exam:   BP 126/81   Pulse 91   Wt 270 lb 12.8 oz (122.8 kg)   LMP 01/31/2023   BMI 47.97 kg/m  Body mass index is 47.97 kg/m. Contractions: Not present Vag. Bleeding: None. Fundal height: not applicable FHTs: 170  General appearance: Well nourished, well developed female in no acute distress.  Neck:  Supple, normal appearance, and no thyromegaly  Cardiovascular: Regular rate Respiratory:  Normal respiratory effort Abdomen: No masses, hernias; diffusely non tender to palpation, non distended Neuro/Psych:  Normal mood and affect.  Skin:  Warm and dry.  Lymphatic:  No inguinal lymphadenopathy.    Assessment/Plan:  Supervision of high risk pregnancy, antepartum  [redacted] weeks gestation of pregnancy Previous  pregnancies reviewed History reviewed and updated Interested in BTL as method of contraception Discussed having family members clean litter boxes for her indoor/outdoor cats U/S scheduled 2/5  Primary hypertension Not on meds, normotensive today Start ldASA  Gastroesophageal reflux disease, unspecified whether esophagitis present Start Pepcid    Follow up in 4 weeks.  The nature of [facility] with multiple MDs and other Advanced Practice Providers was explained to patient; also emphasized that residents, students are part of our team.  No follow-ups on file.  Future Appointments  Date Time Provider Department Center  06/25/2023 10:15 AM [facility] NURSE [facility]  06/25/2023 10:30 AM [facility]3 [facility]   Sundra Aland, MD OB Fellow, Faculty Practice [facility], Center for [facility]

## 2023-04-22 DIAGNOSIS — Z2839 Other underimmunization status: Secondary | ICD-10-CM | POA: Insufficient documentation

## 2023-04-22 DIAGNOSIS — O09899 Supervision of other high risk pregnancies, unspecified trimester: Secondary | ICD-10-CM | POA: Insufficient documentation

## 2023-04-22 LAB — CBC/D/PLT+RPR+RH+ABO+RUBIGG...
Antibody Screen: NEGATIVE
Basophils Absolute: 0 10*3/uL (ref 0.0–0.2)
Basos: 0 %
EOS (ABSOLUTE): 0 10*3/uL (ref 0.0–0.4)
Eos: 0 %
HCV Ab: NONREACTIVE
HIV Screen 4th Generation wRfx: NONREACTIVE
Hematocrit: 37.7 % (ref 34.0–46.6)
Hemoglobin: 12.4 g/dL (ref 11.1–15.9)
Hepatitis B Surface Ag: NEGATIVE
Immature Grans (Abs): 0 10*3/uL (ref 0.0–0.1)
Immature Granulocytes: 0 %
Lymphocytes Absolute: 1.8 10*3/uL (ref 0.7–3.1)
Lymphs: 23 %
MCH: 29.2 pg (ref 26.6–33.0)
MCHC: 32.9 g/dL (ref 31.5–35.7)
MCV: 89 fL (ref 79–97)
Monocytes Absolute: 0.6 10*3/uL (ref 0.1–0.9)
Monocytes: 7 %
Neutrophils Absolute: 5.5 10*3/uL (ref 1.4–7.0)
Neutrophils: 70 %
Platelets: 309 10*3/uL (ref 150–450)
RBC: 4.25 x10E6/uL (ref 3.77–5.28)
RDW: 12.7 % (ref 11.7–15.4)
RPR Ser Ql: NONREACTIVE
Rh Factor: POSITIVE
Rubella Antibodies, IGG: <0.9 {index} — ABNORMAL LOW (ref >0.99)
WBC: 8 10*3/uL (ref 3.4–10.8)

## 2023-04-22 LAB — COMPREHENSIVE METABOLIC PANEL
ALT: 21 [IU]/L (ref 0–32)
AST: 15 [IU]/L (ref 0–40)
Albumin: 3.9 g/dL (ref 3.9–4.9)
Alkaline Phosphatase: 73 [IU]/L (ref 44–121)
BUN/Creatinine Ratio: 9 (ref 9–23)
BUN: 8 mg/dL (ref 6–20)
Bilirubin Total: <0.2 mg/dL (ref 0.0–1.2)
CO2: 20 mmol/L (ref 20–29)
Calcium: 9.6 mg/dL (ref 8.7–10.2)
Chloride: 103 mmol/L (ref 96–106)
Creatinine, Ser: 0.86 mg/dL (ref 0.57–1.00)
Globulin, Total: 3.2 g/dL (ref 1.5–4.5)
Glucose: 102 mg/dL — ABNORMAL HIGH (ref 70–99)
Potassium: 4.1 mmol/L (ref 3.5–5.2)
Sodium: 137 mmol/L (ref 134–144)
Total Protein: 7.1 g/dL (ref 6.0–8.5)
eGFR: 93 mL/min/{1.73_m2} (ref >59)

## 2023-04-22 LAB — HEMOGLOBIN A1C
Est. average glucose Bld gHb Est-mCnc: 114 mg/dL
Hgb A1c MFr Bld: 5.6 % (ref 4.8–5.6)

## 2023-04-22 LAB — HCV INTERPRETATION

## 2023-04-26 DIAGNOSIS — O0990 Supervision of high risk pregnancy, unspecified, unspecified trimester: Secondary | ICD-10-CM | POA: Diagnosis not present

## 2023-04-26 LAB — PANORAMA PRENATAL TEST FULL PANEL:PANORAMA TEST PLUS 5 ADDITIONAL MICRODELETIONS: FETAL FRACTION: 4.6

## 2023-04-27 NOTE — Progress Notes (Signed)
NIPT LR female

## 2023-05-01 LAB — HORIZON CUSTOM: REPORT SUMMARY: NEGATIVE

## 2023-05-19 ENCOUNTER — Encounter: Payer: Self-pay | Admitting: Obstetrics and Gynecology

## 2023-05-19 ENCOUNTER — Other Ambulatory Visit (HOSPITAL_COMMUNITY)
Admission: RE | Admit: 2023-05-19 | Discharge: 2023-05-19 | Disposition: A | Discharge status: Home / Self Care | Payer: Medicaid Other | Source: Ambulatory Visit | Attending: Obstetrics and Gynecology | Admitting: Obstetrics and Gynecology

## 2023-05-19 ENCOUNTER — Ambulatory Visit (INDEPENDENT_AMBULATORY_CARE_PROVIDER_SITE_OTHER): Payer: Medicaid Other | Admitting: Obstetrics and Gynecology

## 2023-05-19 VITALS — BP 121/79 | HR 86 | Wt 265.0 lb

## 2023-05-19 DIAGNOSIS — Z3A15 15 weeks gestation of pregnancy: Secondary | ICD-10-CM | POA: Diagnosis not present

## 2023-05-19 DIAGNOSIS — Z3492 Encounter for supervision of normal pregnancy, unspecified, second trimester: Secondary | ICD-10-CM | POA: Diagnosis not present

## 2023-05-19 DIAGNOSIS — N898 Other specified noninflammatory disorders of vagina: Secondary | ICD-10-CM | POA: Diagnosis not present

## 2023-05-19 DIAGNOSIS — O09899 Supervision of other high risk pregnancies, unspecified trimester: Secondary | ICD-10-CM

## 2023-05-19 DIAGNOSIS — O0990 Supervision of high risk pregnancy, unspecified, unspecified trimester: Secondary | ICD-10-CM

## 2023-05-19 DIAGNOSIS — I10 Essential (primary) hypertension: Secondary | ICD-10-CM

## 2023-05-19 DIAGNOSIS — Z2839 Other underimmunization status: Secondary | ICD-10-CM

## 2023-05-19 DIAGNOSIS — O26891 Other specified pregnancy related conditions, first trimester: Secondary | ICD-10-CM | POA: Insufficient documentation

## 2023-05-19 MED ORDER — METRONIDAZOLE 0.75 % VA GEL: 1.0000 | Freq: Every day | VAGINAL | 1 refills | Status: DC

## 2023-05-19 NOTE — Progress Notes (Signed)
Pt presents for ROB visit. Pt c/o breast tenderness and vaginal irritation. Pt has concerns about weight loss in pregnancy

## 2023-05-19 NOTE — Progress Notes (Signed)
   PRENATAL VISIT NOTE  Subjective:  Annette Wood is a 32 y.o. [phone] at [redacted]w[redacted]d being seen today for ongoing prenatal care.  She is currently monitored for the following issues for this high risk pregnancy and has Vitamin D deficiency; Hypertension; Visit for routine gyn exam; STD exposure; Encounter for IUD removal; Supervision of high risk pregnancy, antepartum; and Rubella non-immune status, antepartum on their problem list.  Patient reports  vaginal irritation hx bv with same symptoms.  Not much of an appetite. Contractions: Not present. Vag. Bleeding: None.  Movement: Absent. Denies leaking of fluid.   The following portions of the patient's history were reviewed and updated as appropriate: allergies, current medications, past family history, past medical history, past social history, past surgical history and problem list.   Objective:   Vitals:   05/19/23 0951  BP: 121/79  Pulse: 86  Weight: 265 lb (120.2 kg)    Fetal Status: Fetal Heart Rate (bpm): 153   Movement: Absent     General:  Alert, oriented and cooperative. Patient is in no acute distress.  Skin: Skin is warm and dry. No rash noted.   Cardiovascular: Normal heart rate noted  Respiratory: Normal respiratory effort, no problems with respiration noted  Abdomen: Soft, gravid, appropriate for gestational age.  Pain/Pressure: Present     Pelvic: Cervical exam deferred         Extremities: Normal range of motion.  Edema: None  Mental Status: Normal mood and affect. Normal behavior. Normal judgment and thought content.   Assessment and Plan:  Pregnancy: [id number] at [redacted]w[redacted]d 1. Supervision of high risk pregnancy, antepartum (Primary) BP and FHR normal Doing well overall, discussed smaller frequent meals, increase protein intake   2. Primary hypertension Normotensive today, has not started ASA yet,discussed recommendation and importance. Is going to pick up and start today   3. [redacted] weeks gestation of pregnancy  - AFP,  Serum, Open Spina Bifida  4. Vaginal irritation Symptomatic tx today, rx sent will follow up with results  - Cervicovaginal ancillary only( [place])  5. Rubella non-immune status, antepartum Offer MMR pp   Preterm labor symptoms and general obstetric precautions including but not limited to vaginal bleeding, contractions, leaking of fluid and fetal movement were reviewed in detail with the patient. Please refer to After Visit Summary for other counseling recommendations.   Return in about 4 weeks (around 06/16/2023) for OB VISIT (MD or APP).  Future Appointments  Date Time Provider Department Center  06/16/2023  9:35 AM Brock Bad, MD [facility] None  06/25/2023 10:15 AM [facility] NURSE [facility]  06/25/2023 10:30 AM [facility] US3 [facility]    Albertine Grates, FNP

## 2023-05-21 LAB — AFP, SERUM, OPEN SPINA BIFIDA
AFP MoM: 1.7
AFP Value: 38.8 ng/mL
Gest. Age on Collection Date: 15 wk
Maternal Age At EDD: 31.7 a
OSBR Risk 1 IN: 3260
Test Results:: NEGATIVE
Weight: 265 [lb_av]

## 2023-05-21 NOTE — L&D Delivery Note (Signed)
 Delivery Note Progressed to complete dilation and did not feel pressure due to heavy epidural   Wanted to go ahead and push, and did well with progression over only one contraction.  At 6:23 AM a viable and healthy female was delivered via Vaginal, Spontaneous (Presentation:   Occiput Anterior).  APGAR: 9, 9; weight  .   Placenta status: Spontaneous, Intact.  Cord: 3 vessels with the following complications: None.    Anesthesia: Epidural Episiotomy: None Lacerations: None Suture Repair: none Est. Blood Loss (mL):   Mom to postpartum.  Baby to Couplet care / Skin to Skin.  Annette Wood 10/22/2023, 6:41 AM

## 2023-05-22 LAB — CERVICOVAGINAL ANCILLARY ONLY
Bacterial Vaginitis (gardnerella): POSITIVE — AB
Candida Glabrata: NEGATIVE
Candida Vaginitis: NEGATIVE
Chlamydia: NEGATIVE
Comment: NEGATIVE
Comment: NEGATIVE
Comment: NEGATIVE
Comment: NEGATIVE
Comment: NEGATIVE
Comment: NORMAL
Neisseria Gonorrhea: NEGATIVE
Trichomonas: NEGATIVE

## 2023-05-30 ENCOUNTER — Encounter: Payer: Self-pay | Admitting: *Deleted

## 2023-06-16 ENCOUNTER — Ambulatory Visit (INDEPENDENT_AMBULATORY_CARE_PROVIDER_SITE_OTHER): Payer: Medicaid Other | Admitting: Obstetrics

## 2023-06-16 VITALS — BP 129/85 | HR 94 | Wt 268.8 lb

## 2023-06-16 DIAGNOSIS — E669 Obesity, unspecified: Secondary | ICD-10-CM

## 2023-06-16 DIAGNOSIS — O09899 Supervision of other high risk pregnancies, unspecified trimester: Secondary | ICD-10-CM

## 2023-06-16 DIAGNOSIS — Z1339 Encounter for screening examination for other mental health and behavioral disorders: Secondary | ICD-10-CM | POA: Diagnosis not present

## 2023-06-16 DIAGNOSIS — O0992 Supervision of high risk pregnancy, unspecified, second trimester: Secondary | ICD-10-CM | POA: Diagnosis not present

## 2023-06-16 DIAGNOSIS — O09892 Supervision of other high risk pregnancies, second trimester: Secondary | ICD-10-CM

## 2023-06-16 DIAGNOSIS — I10 Essential (primary) hypertension: Secondary | ICD-10-CM

## 2023-06-16 DIAGNOSIS — O99210 Obesity complicating pregnancy, unspecified trimester: Secondary | ICD-10-CM

## 2023-06-16 DIAGNOSIS — O99212 Obesity complicating pregnancy, second trimester: Secondary | ICD-10-CM

## 2023-06-16 DIAGNOSIS — Z3A19 19 weeks gestation of pregnancy: Secondary | ICD-10-CM

## 2023-06-16 DIAGNOSIS — O10012 Pre-existing essential hypertension complicating pregnancy, second trimester: Secondary | ICD-10-CM

## 2023-06-16 DIAGNOSIS — O0990 Supervision of high risk pregnancy, unspecified, unspecified trimester: Secondary | ICD-10-CM

## 2023-06-16 DIAGNOSIS — Z2839 Other underimmunization status: Secondary | ICD-10-CM

## 2023-06-16 NOTE — Progress Notes (Signed)
Subjective:  Annette Wood is a 33 y.o. [phone] at [redacted]w[redacted]d being seen today for ongoing prenatal care.  She is currently monitored for the following issues for this high-risk pregnancy and has Vitamin D deficiency; Hypertension; Visit for routine gyn exam; STD exposure; Encounter for IUD removal; Supervision of high risk pregnancy, antepartum; and Rubella non-immune status, antepartum on their problem list.  Patient reports backache.  Contractions: Not present. Vag. Bleeding: None.  Movement: Present. Denies leaking of fluid.   The following portions of the patient's history were reviewed and updated as appropriate: allergies, current medications, past family history, past medical history, past social history, past surgical history and problem list. Problem list updated.  Objective:   Vitals:   06/16/23 0937  BP: 129/85  Pulse: 94  Weight: 268 lb 12.8 oz (121.9 kg)    Fetal Status: Fetal Heart Rate (bpm): 154   Movement: Present     General:  Alert, oriented and cooperative. Patient is in no acute distress.  Skin: Skin is warm and dry. No rash noted.   Cardiovascular: Normal heart rate noted  Respiratory: Normal respiratory effort, no problems with respiration noted  Abdomen: Soft, gravid, appropriate for gestational age. Pain/Pressure: Present     Pelvic:  Cervical exam deferred        Extremities: Normal range of motion.  Edema: None  Mental Status: Normal mood and affect. Normal behavior. Normal judgment and thought content.   Urinalysis:      Assessment and Plan:  Pregnancy: [id number] at [redacted]w[redacted]d  1. Supervision of high risk pregnancy, antepartum (Primary)  2. Primary hypertension - clinically stable  3. Rubella non-immune status, antepartum - Rubella vaccination postpartum  4. Obesity affecting pregnancy, antepartum, unspecified obesity type    There are no diagnoses linked to this encounter. Preterm labor symptoms and general obstetric precautions including but not  limited to vaginal bleeding, contractions, leaking of fluid and fetal movement were reviewed in detail with the patient. Please refer to After Visit Summary for other counseling recommendations.   Return in about 4 weeks (around 07/14/2023) for ROB.   Brock Bad, MD 06/16/2023

## 2023-06-17 ENCOUNTER — Other Ambulatory Visit: Payer: Self-pay | Admitting: *Deleted

## 2023-06-17 MED ORDER — VITAFOL GUMMIES 3.33-0.333-34.8 MG PO CHEW: 3.0000 | CHEWABLE_TABLET | Freq: Every day | ORAL | 11 refills | Status: AC

## 2023-06-17 NOTE — Progress Notes (Signed)
Prenatal gummy sent per pt request.

## 2023-06-18 DIAGNOSIS — O99210 Obesity complicating pregnancy, unspecified trimester: Secondary | ICD-10-CM | POA: Insufficient documentation

## 2023-06-25 ENCOUNTER — Ambulatory Visit: Payer: Medicaid Other | Attending: Obstetrics and Gynecology

## 2023-06-25 ENCOUNTER — Ambulatory Visit: Payer: Medicaid Other | Admitting: *Deleted

## 2023-06-25 ENCOUNTER — Other Ambulatory Visit: Payer: Self-pay | Admitting: *Deleted

## 2023-06-25 ENCOUNTER — Other Ambulatory Visit: Payer: Self-pay

## 2023-06-25 ENCOUNTER — Encounter: Payer: Self-pay | Admitting: *Deleted

## 2023-06-25 VITALS — BP 129/73 | HR 84

## 2023-06-25 DIAGNOSIS — E669 Obesity, unspecified: Secondary | ICD-10-CM | POA: Diagnosis not present

## 2023-06-25 DIAGNOSIS — O09899 Supervision of other high risk pregnancies, unspecified trimester: Secondary | ICD-10-CM

## 2023-06-25 DIAGNOSIS — O99212 Obesity complicating pregnancy, second trimester: Secondary | ICD-10-CM

## 2023-06-25 DIAGNOSIS — O0990 Supervision of high risk pregnancy, unspecified, unspecified trimester: Secondary | ICD-10-CM

## 2023-06-25 DIAGNOSIS — Z3A20 20 weeks gestation of pregnancy: Secondary | ICD-10-CM

## 2023-06-25 DIAGNOSIS — Z2839 Other underimmunization status: Secondary | ICD-10-CM | POA: Insufficient documentation

## 2023-06-25 DIAGNOSIS — O99210 Obesity complicating pregnancy, unspecified trimester: Secondary | ICD-10-CM

## 2023-06-25 DIAGNOSIS — O99213 Obesity complicating pregnancy, third trimester: Secondary | ICD-10-CM | POA: Diagnosis not present

## 2023-07-14 ENCOUNTER — Ambulatory Visit (INDEPENDENT_AMBULATORY_CARE_PROVIDER_SITE_OTHER): Payer: Medicaid Other | Admitting: Family Medicine

## 2023-07-14 VITALS — BP 109/75 | HR 84 | Wt 266.6 lb

## 2023-07-14 DIAGNOSIS — Z2839 Other underimmunization status: Secondary | ICD-10-CM

## 2023-07-14 DIAGNOSIS — Z3A23 23 weeks gestation of pregnancy: Secondary | ICD-10-CM | POA: Diagnosis not present

## 2023-07-14 DIAGNOSIS — O0990 Supervision of high risk pregnancy, unspecified, unspecified trimester: Secondary | ICD-10-CM

## 2023-07-14 DIAGNOSIS — I10 Essential (primary) hypertension: Secondary | ICD-10-CM

## 2023-07-14 DIAGNOSIS — R634 Abnormal weight loss: Secondary | ICD-10-CM | POA: Diagnosis not present

## 2023-07-14 DIAGNOSIS — O99210 Obesity complicating pregnancy, unspecified trimester: Secondary | ICD-10-CM | POA: Diagnosis not present

## 2023-07-14 DIAGNOSIS — O09899 Supervision of other high risk pregnancies, unspecified trimester: Secondary | ICD-10-CM | POA: Diagnosis not present

## 2023-07-14 NOTE — Progress Notes (Signed)
   PRENATAL VISIT NOTE  Subjective:  Annette Wood is a 33 y.o. [phone] at [redacted]w[redacted]d being seen today for ongoing prenatal care.  She is currently monitored for the following issues for this high-risk pregnancy and has Vitamin D deficiency; Hypertension; STD exposure; Encounter for IUD removal; Supervision of high risk pregnancy, antepartum; Rubella non-immune status, antepartum; and Obesity affecting pregnancy, antepartum on their problem list.  Patient reports no complaints.  Contractions: Not present. Vag. Bleeding: None.  Movement: Present. Denies leaking of fluid.   The following portions of the patient's history were reviewed and updated as appropriate: allergies, current medications, past family history, past medical history, past social history, past surgical history and problem list.   Objective:   Vitals:   07/14/23 0830  BP: 109/75  Pulse: 84  Weight: 266 lb 9.6 oz (120.9 kg)    Fetal Status: Fetal Heart Rate (bpm): 147 Fundal Height: 26 cm Movement: Present     General:  Alert, oriented and cooperative. Patient is in no acute distress.  Skin: Skin is warm and dry. No rash noted.   Cardiovascular: Normal heart rate noted  Respiratory: Normal respiratory effort, no problems with respiration noted  Abdomen: Soft, gravid, appropriate for gestational age.  Pain/Pressure: Absent     Pelvic: Cervical exam deferred        Extremities: Normal range of motion.  Edema: None  Mental Status: Normal mood and affect. Normal behavior. Normal judgment and thought content.   Assessment and Plan:  Pregnancy: [id number] at [redacted]w[redacted]d  Supervision of high risk pregnancy, antepartum  [redacted] weeks gestation of pregnancy Prenatal course reviewed BP, HR, FHR normal Feeling regular FM  Interested in PP BTL -- discussed typically salpingectomy performed, pt understanding and agreeable, will sign papers next appt  Primary hypertension BP wnl today Cont ldASA  Rubella non-immune status,  antepartum Offer vaccine PP  Obesity affecting pregnancy, antepartum, unspecified obesity type  Weight loss Discussed that weight loss not optimal during pregnancy Rec small frequent meals every 2-3 hours, rec protein heavy, healthy fats FH appropriate, has upcoming U/S 3/10   Preterm labor symptoms and general obstetric precautions including but not limited to vaginal bleeding, contractions, leaking of fluid and fetal movement were reviewed in detail with the patient. Please refer to After Visit Summary for other counseling recommendations.   Return in about 4 weeks (around 08/11/2023) for Routine prenatal care.  Future Appointments  Date Time Provider Department Center  07/28/2023 10:15 AM [facility] NURSE [facility]  07/28/2023 10:30 AM [facility] US4 [facility]  08/18/2023  9:00 AM [facility] LAB [facility] None  08/18/2023  9:35 AM Constant, Gigi Gin, MD [facility] None    Sundra Aland, MD

## 2023-07-14 NOTE — Progress Notes (Signed)
 Concerned about wt fluctuation.

## 2023-07-19 ENCOUNTER — Inpatient Hospital Stay (HOSPITAL_COMMUNITY)
Admission: AD | Admit: 2023-07-19 | Discharge: 2023-07-19 | Disposition: A | Discharge status: Home / Self Care | Attending: Obstetrics & Gynecology | Admitting: Obstetrics & Gynecology

## 2023-07-19 ENCOUNTER — Encounter (HOSPITAL_COMMUNITY): Payer: Self-pay | Admitting: Obstetrics & Gynecology

## 2023-07-19 DIAGNOSIS — Z2839 Other underimmunization status: Secondary | ICD-10-CM

## 2023-07-19 DIAGNOSIS — M869 Osteomyelitis, unspecified: Secondary | ICD-10-CM

## 2023-07-19 DIAGNOSIS — Z3A24 24 weeks gestation of pregnancy: Secondary | ICD-10-CM | POA: Diagnosis not present

## 2023-07-19 DIAGNOSIS — N760 Acute vaginitis: Secondary | ICD-10-CM

## 2023-07-19 DIAGNOSIS — O0990 Supervision of high risk pregnancy, unspecified, unspecified trimester: Secondary | ICD-10-CM

## 2023-07-19 DIAGNOSIS — O26899 Other specified pregnancy related conditions, unspecified trimester: Secondary | ICD-10-CM

## 2023-07-19 DIAGNOSIS — O23592 Infection of other part of genital tract in pregnancy, second trimester: Secondary | ICD-10-CM | POA: Diagnosis not present

## 2023-07-19 DIAGNOSIS — O99891 Other specified diseases and conditions complicating pregnancy: Secondary | ICD-10-CM | POA: Insufficient documentation

## 2023-07-19 DIAGNOSIS — B9689 Other specified bacterial agents as the cause of diseases classified elsewhere: Secondary | ICD-10-CM | POA: Diagnosis not present

## 2023-07-19 DIAGNOSIS — N949 Unspecified condition associated with female genital organs and menstrual cycle: Secondary | ICD-10-CM | POA: Insufficient documentation

## 2023-07-19 DIAGNOSIS — R102 Pelvic and perineal pain: Secondary | ICD-10-CM | POA: Diagnosis present

## 2023-07-19 LAB — URINALYSIS, ROUTINE W REFLEX MICROSCOPIC
Bilirubin Urine: NEGATIVE
Glucose, UA: NEGATIVE mg/dL
Hgb urine dipstick: NEGATIVE
Ketones, ur: NEGATIVE mg/dL
Leukocytes,Ua: NEGATIVE
Nitrite: NEGATIVE
Protein, ur: NEGATIVE mg/dL
Specific Gravity, Urine: 1.024 (ref 1.005–1.030)
pH: 6 (ref 5.0–8.0)

## 2023-07-19 LAB — WET PREP, GENITAL
Sperm: NONE SEEN
Trich, Wet Prep: NONE SEEN
WBC, Wet Prep HPF POC: >=10 — AB (ref <10)
Yeast Wet Prep HPF POC: NONE SEEN

## 2023-07-19 MED ORDER — ACETAMINOPHEN 500 MG PO TABS
1000.0000 mg | ORAL_TABLET | Freq: Once | ORAL | Status: AC
  Administered 2023-07-19: 1000 mg via ORAL
  Filled 2023-07-19: qty 2

## 2023-07-19 MED ORDER — METRONIDAZOLE 500 MG PO TABS: 500.0000 mg | ORAL_TABLET | Freq: Two times a day (BID) | ORAL | 0 refills | Status: DC

## 2023-07-19 NOTE — MAU Note (Addendum)
..  Annette Wood is a 33 y.o. at [redacted]w[redacted]d here in MAU reporting: starting yesterday she started having an increase in pelvic pain, she reports that its worse when she walks and when she tries to get in the car. Denies VB or LOF. +FM. Has not taken any meds at home for the pain. Denies abnormal vaginal discharge.   Denies urinary s/sx.   Pain score: 8 Vitals:   07/19/23 1718  BP: 125/63  Pulse: (!) 104  Resp: 17  Temp: 98 F (36.7 C)  SpO2: 99%     FHT:151 Lab orders placed from triage:   UA

## 2023-07-19 NOTE — MAU Provider Note (Signed)
 Pelvic pain  S [name] is a 33 y.o. [phone] pregnant female at [redacted]w[redacted]d who presents to MAU today with complaint of increased pelvic pain. Describes it as worse when she walks or trying to get in/out of car. Has not taken anything for the pain.  Denies Vaginal d/c. Denies VB, LOF.  +FM.    Receives care at [facility]. Prenatal records reviewed.  Pertinent items noted in HPI and remainder of comprehensive ROS otherwise negative.   O BP 125/63 (BP Location: Right Arm)   Pulse (!) 104   Temp 98 F (36.7 C) (Oral)   Resp 17   Wt 122.6 kg   LMP 01/31/2023   SpO2 99%   BMI 47.88 kg/m  Physical Exam Vitals and nursing note reviewed.  Constitutional:      General: She is not in acute distress.    Appearance: Normal appearance. She is obese. She is not ill-appearing.  HENT:     Head: Normocephalic and atraumatic.     Right Ear: External ear normal.     Left Ear: External ear normal.     Nose: Nose normal. No congestion.     Mouth/Throat:     Mouth: Mucous membranes are moist.     Pharynx: Oropharynx is clear.  Eyes:     Extraocular Movements: Extraocular movements intact.     Conjunctiva/sclera: Conjunctivae normal.  Cardiovascular:     Rate and Rhythm: Normal rate.  Pulmonary:     Effort: Pulmonary effort is normal. No respiratory distress.  Abdominal:     Comments: Gravid, b/l inguinal TTP over round ligaments, TTP over pubic symphysis   Musculoskeletal:        General: No swelling. Normal range of motion.     Cervical back: Normal range of motion.  Skin:    General: Skin is warm and dry.  Neurological:     Mental Status: She is alert and oriented to person, place, and time. Mental status is at baseline.     Motor: No weakness.     Gait: Gait normal.  Psychiatric:        Mood and Affect: Mood normal.        Behavior: Behavior normal.    NST: 150bpm, moderate variability, +accels, no decels, no ctx, appropriate for GA   MDM: MAU Course:  UA negative  Wet  Prep bacterial vaginitis GC collected   AP #[redacted] weeks gestation #Osteitis Pubis  #B/l round ligament pain - home regimen given  #Bacterial Vaginitis - written for flagyl  Discharge from MAU in stable condition with strict/usual precautions Follow up at [facility] as scheduled for ongoing prenatal care  Allergies as of 07/19/2023   No Known Allergies      Medication List     TAKE these medications    aspirin EC 81 MG tablet Take 2 tablets (162 mg total) by mouth daily. Swallow whole.   Blood Pressure Kit Devi 1 Device by Does not apply route once a week.   metroNIDAZOLE 500 MG tablet Commonly known as: FLAGYL Take 1 tablet (500 mg total) by mouth 2 (two) times daily.   Vitafol Gummies 3.33-0.333-34.8 MG Chew Chew 3 tablets by mouth daily.        Hessie Dibble, MD 07/19/2023 6:11 PM

## 2023-07-19 NOTE — Discharge Instructions (Signed)

## 2023-07-21 LAB — GC/CHLAMYDIA PROBE AMP (~~LOC~~) NOT AT ARMC
Chlamydia: NEGATIVE
Comment: NEGATIVE
Comment: NORMAL
Neisseria Gonorrhea: NEGATIVE

## 2023-07-28 ENCOUNTER — Ambulatory Visit: Payer: Medicaid Other | Admitting: *Deleted

## 2023-07-28 ENCOUNTER — Ambulatory Visit (HOSPITAL_BASED_OUTPATIENT_CLINIC_OR_DEPARTMENT_OTHER): Admitting: Obstetrics

## 2023-07-28 ENCOUNTER — Other Ambulatory Visit: Payer: Self-pay | Admitting: *Deleted

## 2023-07-28 ENCOUNTER — Ambulatory Visit: Payer: Medicaid Other | Attending: Maternal & Fetal Medicine

## 2023-07-28 VITALS — BP 126/73 | HR 78

## 2023-07-28 DIAGNOSIS — Z2839 Other underimmunization status: Secondary | ICD-10-CM | POA: Diagnosis not present

## 2023-07-28 DIAGNOSIS — O09899 Supervision of other high risk pregnancies, unspecified trimester: Secondary | ICD-10-CM | POA: Insufficient documentation

## 2023-07-28 DIAGNOSIS — O10912 Unspecified pre-existing hypertension complicating pregnancy, second trimester: Secondary | ICD-10-CM

## 2023-07-28 DIAGNOSIS — O0990 Supervision of high risk pregnancy, unspecified, unspecified trimester: Secondary | ICD-10-CM | POA: Diagnosis not present

## 2023-07-28 DIAGNOSIS — E669 Obesity, unspecified: Secondary | ICD-10-CM | POA: Diagnosis not present

## 2023-07-28 DIAGNOSIS — Z3A25 25 weeks gestation of pregnancy: Secondary | ICD-10-CM | POA: Insufficient documentation

## 2023-07-28 DIAGNOSIS — O99212 Obesity complicating pregnancy, second trimester: Secondary | ICD-10-CM

## 2023-07-28 NOTE — Progress Notes (Signed)
 MFM Consult Note  Annette Wood is currently at 25 weeks and 3 days.  She has been followed due to maternal obesity with a BMI of 48.  She reports a history of chronic hypertension that is not treated with any medications.  She denies any problems since her last exam.  She was informed that the fetal growth and amniotic fluid level appears appropriate for her gestational age.  The views of the fetal anatomy were visualized today.  There were no obvious anomalies noted.    The limitations of ultrasound in the detection of all anomalies was discussed.    Due to maternal obesity, we will start weekly fetal testing at 34 weeks.    She will return in 4 weeks for another growth scan.    The patient stated that all of her questions were answered today.  A total of 20 minutes was spent counseling and coordinating the care for this patient.  Greater than 50% of the time was spent in direct face-to-face contact.

## 2023-08-18 ENCOUNTER — Encounter: Payer: Self-pay | Admitting: Obstetrics and Gynecology

## 2023-08-18 ENCOUNTER — Other Ambulatory Visit: Payer: Medicaid Other

## 2023-08-18 ENCOUNTER — Ambulatory Visit (INDEPENDENT_AMBULATORY_CARE_PROVIDER_SITE_OTHER): Payer: Medicaid Other | Admitting: Obstetrics and Gynecology

## 2023-08-18 VITALS — BP 105/72 | HR 103 | Wt 269.0 lb

## 2023-08-18 DIAGNOSIS — O09899 Supervision of other high risk pregnancies, unspecified trimester: Secondary | ICD-10-CM | POA: Diagnosis not present

## 2023-08-18 DIAGNOSIS — Z1339 Encounter for screening examination for other mental health and behavioral disorders: Secondary | ICD-10-CM

## 2023-08-18 DIAGNOSIS — Z3A28 28 weeks gestation of pregnancy: Secondary | ICD-10-CM

## 2023-08-18 DIAGNOSIS — Z2839 Other underimmunization status: Secondary | ICD-10-CM | POA: Diagnosis not present

## 2023-08-18 DIAGNOSIS — Z23 Encounter for immunization: Secondary | ICD-10-CM

## 2023-08-18 DIAGNOSIS — O99210 Obesity complicating pregnancy, unspecified trimester: Secondary | ICD-10-CM

## 2023-08-18 DIAGNOSIS — O0990 Supervision of high risk pregnancy, unspecified, unspecified trimester: Secondary | ICD-10-CM

## 2023-08-18 DIAGNOSIS — O99213 Obesity complicating pregnancy, third trimester: Secondary | ICD-10-CM | POA: Diagnosis not present

## 2023-08-18 DIAGNOSIS — I10 Essential (primary) hypertension: Secondary | ICD-10-CM | POA: Diagnosis not present

## 2023-08-18 DIAGNOSIS — O0993 Supervision of high risk pregnancy, unspecified, third trimester: Secondary | ICD-10-CM | POA: Diagnosis not present

## 2023-08-18 NOTE — Progress Notes (Addendum)
   PRENATAL VISIT NOTE  Subjective:  Annette Wood is a 33 y.o. [phone] at [redacted]w[redacted]d being seen today for ongoing prenatal care.  She is currently monitored for the following issues for this high-risk pregnancy and has Vitamin D deficiency; Hypertension; STD exposure; Encounter for IUD removal; Supervision of high risk pregnancy, antepartum; Rubella non-immune status, antepartum; and Obesity affecting pregnancy, antepartum on their problem list.  Patient reports no complaints.  Contractions: Not present. Vag. Bleeding: None.  Movement: Present. Denies leaking of fluid.   The following portions of the patient's history were reviewed and updated as appropriate: allergies, current medications, past family history, past medical history, past social history, past surgical history and problem list.   Objective:   Vitals:   08/18/23 0935  BP: 105/72  Pulse: (!) 103  Weight: 269 lb (122 kg)    Fetal Status: Fetal Heart Rate (bpm): 149 Fundal Height: 29 cm Movement: Present     General:  Alert, oriented and cooperative. Patient is in no acute distress.  Skin: Skin is warm and dry. No rash noted.   Cardiovascular: Normal heart rate noted  Respiratory: Normal respiratory effort, no problems with respiration noted  Abdomen: Soft, gravid, appropriate for gestational age.  Pain/Pressure: Absent     Pelvic: Cervical exam deferred        Extremities: Normal range of motion.  Edema: Trace  Mental Status: Normal mood and affect. Normal behavior. Normal judgment and thought content.   Assessment and Plan:  Pregnancy: [id number] at [redacted]w[redacted]d 1. Supervision of high risk pregnancy, antepartum (Primary) Patient is doing well without complaints Third trimester labs today BTL consent signed today - Glucose Tolerance, 2 Hours w/1 Hour - RPR - CBC - HIV antibody (with reflex) - Tdap vaccine greater than or equal to 7yo IM  2. [redacted] weeks gestation of pregnancy   3. Obesity affecting pregnancy, antepartum,  unspecified obesity type Antenatal testing and fetal growth surveillance per MFM as scheduled  4. Primary hypertension BP stable without medication Continue ASA  5. Rubella non-immune status, antepartum Will offer postpartum  Preterm labor symptoms and general obstetric precautions including but not limited to vaginal bleeding, contractions, leaking of fluid and fetal movement were reviewed in detail with the patient. Please refer to After Visit Summary for other counseling recommendations.   Return in about 2 weeks (around 09/01/2023) for in person, ROB, High risk.  Future Appointments  Date Time Provider Department Center  08/27/2023 10:15 AM [facility] NURSE [facility]  08/27/2023 10:30 AM [facility] US2 [facility]  09/01/2023  9:55 AM Warden Fillers, MD [facility] None  09/29/2023  9:15 AM [facility] NURSE [facility]  09/29/2023  9:30 AM [facility] US3 [facility]  10/06/2023  9:15 AM [facility] NURSE [facility]  10/06/2023  9:30 AM [facility]3 [facility]    Catalina Antigua, MD

## 2023-08-18 NOTE — Progress Notes (Addendum)
 ROB/GTT.  TDAP given in RD, tolerated well. Pt is concerned that she is not gaining any weight.

## 2023-08-19 LAB — GLUCOSE TOLERANCE, 2 HOURS W/ 1HR
Glucose, 1 hour: 153 mg/dL (ref 70–179)
Glucose, 2 hour: 125 mg/dL (ref 70–152)
Glucose, Fasting: 74 mg/dL (ref 70–91)

## 2023-08-19 LAB — CBC
Hematocrit: 32.4 % — ABNORMAL LOW (ref 34.0–46.6)
Hemoglobin: 10.9 g/dL — ABNORMAL LOW (ref 11.1–15.9)
MCH: 29.4 pg (ref 26.6–33.0)
MCHC: 33.6 g/dL (ref 31.5–35.7)
MCV: 87 fL (ref 79–97)
Platelets: 261 10*3/uL (ref 150–450)
RBC: 3.71 x10E6/uL — ABNORMAL LOW (ref 3.77–5.28)
RDW: 12.5 % (ref 11.7–15.4)
WBC: 9.4 10*3/uL (ref 3.4–10.8)

## 2023-08-19 LAB — HIV ANTIBODY (ROUTINE TESTING W REFLEX): HIV Screen 4th Generation wRfx: NONREACTIVE

## 2023-08-19 LAB — RPR: RPR Ser Ql: NONREACTIVE

## 2023-08-21 ENCOUNTER — Other Ambulatory Visit: Payer: Self-pay | Admitting: Obstetrics and Gynecology

## 2023-08-21 MED ORDER — ACCRUFER 30 MG PO CAPS: 1.0000 | ORAL_CAPSULE | Freq: Every day | ORAL | 3 refills | Status: DC

## 2023-08-25 ENCOUNTER — Other Ambulatory Visit: Payer: Self-pay

## 2023-08-25 MED ORDER — METRONIDAZOLE 500 MG PO TABS
500.0000 mg | ORAL_TABLET | Freq: Two times a day (BID) | ORAL | 0 refills | Status: DC
Start: 2023-08-25 — End: 2023-09-10

## 2023-08-25 NOTE — Progress Notes (Signed)
 Pt never picked up flagyl on 3/1 for + BV. Pt still reports symptoms, flagyl rx sent per protocol

## 2023-08-27 ENCOUNTER — Encounter: Payer: Self-pay | Admitting: Maternal & Fetal Medicine

## 2023-08-27 ENCOUNTER — Ambulatory Visit: Attending: Obstetrics

## 2023-08-27 ENCOUNTER — Ambulatory Visit: Admitting: Maternal & Fetal Medicine

## 2023-08-27 ENCOUNTER — Ambulatory Visit

## 2023-08-27 VITALS — BP 117/70 | HR 91

## 2023-08-27 DIAGNOSIS — O99210 Obesity complicating pregnancy, unspecified trimester: Secondary | ICD-10-CM | POA: Diagnosis not present

## 2023-08-27 DIAGNOSIS — O09899 Supervision of other high risk pregnancies, unspecified trimester: Secondary | ICD-10-CM | POA: Insufficient documentation

## 2023-08-27 DIAGNOSIS — O10013 Pre-existing essential hypertension complicating pregnancy, third trimester: Secondary | ICD-10-CM

## 2023-08-27 DIAGNOSIS — Z2839 Other underimmunization status: Secondary | ICD-10-CM | POA: Insufficient documentation

## 2023-08-27 DIAGNOSIS — Z3A29 29 weeks gestation of pregnancy: Secondary | ICD-10-CM

## 2023-08-27 DIAGNOSIS — O99212 Obesity complicating pregnancy, second trimester: Secondary | ICD-10-CM | POA: Diagnosis not present

## 2023-08-27 DIAGNOSIS — O0990 Supervision of high risk pregnancy, unspecified, unspecified trimester: Secondary | ICD-10-CM

## 2023-08-27 DIAGNOSIS — O99213 Obesity complicating pregnancy, third trimester: Secondary | ICD-10-CM

## 2023-08-27 DIAGNOSIS — E669 Obesity, unspecified: Secondary | ICD-10-CM

## 2023-08-27 DIAGNOSIS — O10912 Unspecified pre-existing hypertension complicating pregnancy, second trimester: Secondary | ICD-10-CM | POA: Diagnosis not present

## 2023-08-27 NOTE — Progress Notes (Signed)
 Patient information  Patient Name: Annette Wood  Patient MRN:   [id number]  Referring practice: MFM Referring Provider: [facility])  MFM CONSULT  Annette Wood is a 33 y.o. [id number] at [redacted]w[redacted]d here for ultrasound and consultation. Patient Active Problem List   Diagnosis Date Noted   Obesity affecting pregnancy, antepartum 06/18/2023   Rubella non-immune status, antepartum 04/22/2023   Supervision of high risk pregnancy, antepartum 03/31/2023   STD exposure 08/28/2022   Vitamin D deficiency 08/27/2012    Annette Wood is doing well today with no acute concerns.  RE elevated BMI in pregnancy: She has been previously counseled about this and how it relates to pregnancy.  I discussed the importance of continued growth ultrasounds and antenatal testing starting at her next ultrasound appointment at 34 weeks.  Weight gain should be limited to less than 20 pounds by the end of the pregnancy  RE hx of HTN: The patient has a history of hypertension in her problem list.  She denies taking antihypertensive therapy or having high blood pressure immediately prior to pregnancy.  She is compliant with prenatal aspirin.  I discussed the signs and symptoms of preeclampsia.  She is also enrolled in the at home blood pressure monitoring program and will continue to monitor her blood pressure at home.  Today her blood pressure is normal and she is without concerning symptoms.  Sonographic findings Single intrauterine pregnancy at 29w 5d.  Fetal cardiac activity:  Observed and appears normal. Presentation: Cephalic. Interval fetal anatomy appears normal. Fetal biometry shows the estimated fetal weight at the 53 percentile. Amniotic fluid volume: Within normal limits. MVP: 4.33 cm. Placenta: Posterior. There are limitations of prenatal ultrasound such as the inability to detect certain abnormalities due to poor visualization. Various factors such as fetal position,  gestational age and maternal body habitus may increase the difficulty in visualizing the fetal anatomy.    Recommendations -Serial growth ultrasounds every 4-6 weeks until delivery -Antenatal testing to start around 34 weeks  -Delivery around 39-[redacted] weeks gestation  Review of Systems: A review of systems was performed and was negative except per HPI   Vitals and Physical Exam    08/27/2023    9:48 AM 08/18/2023    9:35 AM 07/28/2023   10:10 AM  Vitals with BMI  Weight  269 lbs   Systolic 117 105 147  Diastolic 70 72 73  Pulse 91 103 78    Sitting comfortably on the sonogram table Nonlabored breathing Normal rate and rhythm Abdomen is nontender  Past pregnancies OB History  Gravida Para Term Preterm AB Living  7 3 3  0 3 3  SAB IAB Ectopic Multiple Live Births  1 2 0 0 3    # Outcome Date GA Lbr Len/2nd Weight Sex Type Anes PTL Lv  7 Current           6 SAB 08/2021          5 IAB 2020          4 Term 04/01/13 [redacted]w[redacted]d 05:40 / 00:15 7 lb 1.6 oz (3.221 kg) F Vag-Spont EPI  LIV  3 Term 09/20/09 [redacted]w[redacted]d  7 lb 8 oz (3.402 kg) F Vag-Spont EPI N LIV  2 Term 06/09/08 [redacted]w[redacted]d  6 lb 3 oz (2.807 kg) M Vag-Spont None N LIV     Birth Comments: delivered in car  1 IAB  Birth Comments: medication Ab    Obstetric Comments  2020 procedure     I spent 20 minutes reviewing the patients chart, including labs and images as well as counseling the patient about her medical conditions. Greater than 50% of the time was spent in direct face-to-face patient counseling.  Braxton Feathers  MFM, [facility]   08/27/2023  11:18 AM

## 2023-08-27 NOTE — Progress Notes (Signed)
 duplicate

## 2023-09-01 ENCOUNTER — Ambulatory Visit (INDEPENDENT_AMBULATORY_CARE_PROVIDER_SITE_OTHER): Admitting: Obstetrics and Gynecology

## 2023-09-01 VITALS — BP 108/75 | HR 92 | Wt 268.7 lb

## 2023-09-01 DIAGNOSIS — O09899 Supervision of other high risk pregnancies, unspecified trimester: Secondary | ICD-10-CM | POA: Diagnosis not present

## 2023-09-01 DIAGNOSIS — O0990 Supervision of high risk pregnancy, unspecified, unspecified trimester: Secondary | ICD-10-CM

## 2023-09-01 DIAGNOSIS — Z2839 Other underimmunization status: Secondary | ICD-10-CM

## 2023-09-01 DIAGNOSIS — O99210 Obesity complicating pregnancy, unspecified trimester: Secondary | ICD-10-CM | POA: Diagnosis not present

## 2023-09-01 DIAGNOSIS — Z3A30 30 weeks gestation of pregnancy: Secondary | ICD-10-CM

## 2023-09-01 DIAGNOSIS — Z6841 Body Mass Index (BMI) 40.0 and over, adult: Secondary | ICD-10-CM | POA: Insufficient documentation

## 2023-09-01 NOTE — Progress Notes (Signed)
 Pt presents for rob. Pt has questions about her weight.

## 2023-09-01 NOTE — Progress Notes (Signed)
   PRENATAL VISIT NOTE  Subjective:  Annette Wood is a 33 y.o. [phone] at [redacted]w[redacted]d being seen today for ongoing prenatal care.  She is currently monitored for the following issues for this high-risk pregnancy and has Vitamin D deficiency; STD exposure; Supervision of high risk pregnancy, antepartum; Rubella non-immune status, antepartum; Obesity affecting pregnancy, antepartum; and BMI 45.0-49.9, adult (HCC) on their problem list.  Patient doing well with no acute concerns today. She reports no complaints.  Contractions: Not present. Vag. Bleeding: None.  Movement: Present. Denies leaking of fluid.  Pt had questions regarding weight gain in pregnancy.  Relayed to patient that patient's with heavier initial starting weight are advised to gain less, usually 10-15 pounds instead of 25-35 pounds.  The following portions of the patient's history were reviewed and updated as appropriate: allergies, current medications, past family history, past medical history, past social history, past surgical history and problem list. Problem list updated.  Objective:   Vitals:   09/01/23 0931  BP: 108/75  Pulse: 92  Weight: 268 lb 11.2 oz (121.9 kg)    Fetal Status: Fetal Heart Rate (bpm): 139 Fundal Height: 31 cm Movement: Present     General:  Alert, oriented and cooperative. Patient is in no acute distress.  Skin: Skin is warm and dry. No rash noted.   Cardiovascular: Normal heart rate noted  Respiratory: Normal respiratory effort, no problems with respiration noted  Abdomen: Soft, gravid, appropriate for gestational age.  Pain/Pressure: Absent     Pelvic: Cervical exam deferred        Extremities: Normal range of motion.  Edema: Trace  Mental Status:  Normal mood and affect. Normal behavior. Normal judgment and thought content.   Assessment and Plan:  Pregnancy: [id number] at [redacted]w[redacted]d  1. [redacted] weeks gestation of pregnancy (Primary)   2. Supervision of high risk pregnancy, antepartum Continue routine  prenatal care  3. Rubella non-immune status, antepartum Treat after delivery  4. Obesity affecting pregnancy, antepartum, unspecified obesity type   5. BMI 45.0-49.9, adult (HCC)   Preterm labor symptoms and general obstetric precautions including but not limited to vaginal bleeding, contractions, leaking of fluid and fetal movement were reviewed in detail with the patient.  Please refer to After Visit Summary for other counseling recommendations.   Return in about 2 weeks (around 09/15/2023) for in person.   Avie Boeck, MD Faculty Attending Center for [facility]

## 2023-09-09 ENCOUNTER — Encounter: Payer: Self-pay | Admitting: Obstetrics and Gynecology

## 2023-09-09 ENCOUNTER — Telehealth: Payer: Self-pay

## 2023-09-09 NOTE — Telephone Encounter (Signed)
 S/w pt and she advised that she has been having intermittent contractions for the past 4 hours and now is feeling vaginal pain/pressure. Pt states that she has to breathe through the contractions, advised pt to be evaluated at the hospital.

## 2023-09-10 ENCOUNTER — Inpatient Hospital Stay (HOSPITAL_COMMUNITY)
Admission: AD | Admit: 2023-09-10 | Discharge: 2023-09-10 | Disposition: A | Discharge status: Home / Self Care | Attending: Obstetrics and Gynecology | Admitting: Obstetrics and Gynecology

## 2023-09-10 ENCOUNTER — Encounter (HOSPITAL_COMMUNITY): Payer: Self-pay | Admitting: Obstetrics and Gynecology

## 2023-09-10 DIAGNOSIS — O479 False labor, unspecified: Secondary | ICD-10-CM

## 2023-09-10 DIAGNOSIS — Z3A31 31 weeks gestation of pregnancy: Secondary | ICD-10-CM | POA: Diagnosis not present

## 2023-09-10 DIAGNOSIS — O4703 False labor before 37 completed weeks of gestation, third trimester: Secondary | ICD-10-CM | POA: Insufficient documentation

## 2023-09-10 LAB — URINALYSIS, ROUTINE W REFLEX MICROSCOPIC
Bilirubin Urine: NEGATIVE
Glucose, UA: NEGATIVE mg/dL
Hgb urine dipstick: NEGATIVE
Ketones, ur: NEGATIVE mg/dL
Leukocytes,Ua: NEGATIVE
Nitrite: NEGATIVE
Protein, ur: NEGATIVE mg/dL
Specific Gravity, Urine: 1.018 (ref 1.005–1.030)
pH: 5 (ref 5.0–8.0)

## 2023-09-10 NOTE — MAU Provider Note (Signed)
 None     S [name] is a 33 y.o. [phone] pregnant/non-pregnant female at [redacted]w[redacted]d who presents to MAU today with complaint of contractions. Endorses contractions every 15-20 minutes, good fetal movement. Denies vaginal bleeding, leaking of fluid.  Receives care at [facility] [place]. Prenatal records reviewed. Next appointment on 09/15/2023.   Pertinent items noted in HPI and remainder of comprehensive ROS otherwise negative.   O BP 111/69   Pulse 94   Temp 98.8 F (37.1 C)   Resp 18   Ht 5\' 3"  (1.6 m)   Wt 121.1 kg   LMP 01/31/2023   BMI 47.30 kg/m  Physical Exam Vitals reviewed.  Constitutional:      General: She is not in acute distress.    Appearance: She is well-developed. She is not diaphoretic.  Eyes:     General: No scleral icterus. Pulmonary:     Effort: Pulmonary effort is normal. No respiratory distress.  Skin:    General: Skin is warm and dry.  Neurological:     Mental Status: She is alert.     Coordination: Coordination normal.    Results for orders placed or performed during the hospital encounter of 09/10/23 (from the past 24 hours)  Urinalysis, Routine w reflex microscopic -Urine, Clean Catch     Status: Abnormal   Collection Time: 09/10/23  1:56 PM  Result Value Ref Range   Color, Urine YELLOW YELLOW   APPearance HAZY (A) CLEAR   Specific Gravity, Urine 1.018 1.005 - 1.030   pH 5.0 5.0 - 8.0   Glucose, UA NEGATIVE NEGATIVE mg/dL   Hgb urine dipstick NEGATIVE NEGATIVE   Bilirubin Urine NEGATIVE NEGATIVE   Ketones, ur NEGATIVE NEGATIVE mg/dL   Protein, ur NEGATIVE NEGATIVE mg/dL   Nitrite NEGATIVE NEGATIVE   Leukocytes,Ua NEGATIVE NEGATIVE    MDM: Low MAU Course: -NST reassuring and reactive  A 1. Braxton Hicks contractions (Primary) - Discharge patient  2. [redacted] weeks gestation of pregnancy - Discharge patient  Medical screening exam complete  Patient was assessed for active labor and managed by nursing staff during this encounter. I  have reviewed the chart and agree with the documentation and plan. I have also reviewed the NST for appropriate reactivity.  Fetal Tracing: Baseline: 150 bpm Variability: moderate Accelerations: yes   Decelerations: none Toco: no uterine contractions   P Discharge from MAU in stable condition with preterm labor precautions. Recommend position changes, stretching, adequate hydration, magnesium glycinate for Braxton-Hicks. Follow up at [facility] as scheduled for ongoing prenatal care  Future Appointments  Date Time Provider Department Center  09/15/2023  8:55 AM Tresia Fruit, MD [facility] None  09/29/2023  9:00 AM [facility] PROVIDER 1 [facility]  09/29/2023  9:30 AM [facility] US3 [facility]  10/06/2023  9:00 AM [facility] PROVIDER 1 [facility]  10/06/2023  9:30 AM [facility] US3 [facility]   Allergies as of 09/10/2023   No Known Allergies      Medication List     STOP taking these medications    metroNIDAZOLE  500 MG tablet Commonly known as: FLAGYL        TAKE these medications    ACCRUFeR  30 MG Caps Generic drug: Ferric Maltol  Take 1 capsule (30 mg total) by mouth daily with breakfast.   aspirin  EC 81 MG tablet Take 2 tablets (162 mg total) by mouth daily. Swallow whole.   Blood Pressure Kit Devi 1 Device by Does not apply route once a week.   Vitafol   Gummies 3.33-0.333-34.8 MG Chew Chew 3 tablets by mouth daily.        Noreene Bearded, [place] 09/10/2023 2:46 PM   Noreene Bearded, PA

## 2023-09-10 NOTE — MAU Note (Signed)
.  Annette Wood is a 33 y.o. at [redacted]w[redacted]d here in MAU reporting: having braxton hicks ctd since yesterday. Continues today about q 15-20 min. Denies any vag bleeding or leaking. Good fetal movement felt.   LMP:  Onset of complaint: yesterday Pain score: 6-7 Vitals:   09/10/23 1410  BP: 111/69  Pulse: 94  Resp: 18  Temp: 98.8 F (37.1 C)     FHT: 155  Lab orders placed from triage: u/a

## 2023-09-10 NOTE — Discharge Instructions (Signed)
 Now that the weather is warmer, you may need to increase your fluid intake. Aim for 64-96 ounces of fluid each day.  You can start a daily magnesium glycinate supplement. This can be helpful for cramps or contractions.  You can wear a belly band to support your uterus and take pressure off of areas that are irritating and cause sharp pain in the genital area. You can reposition or do stretches. Cat-Cow positions or stretches for the hips can be helpful.

## 2023-09-15 ENCOUNTER — Ambulatory Visit: Admitting: Obstetrics & Gynecology

## 2023-09-15 VITALS — BP 115/79 | HR 93 | Wt 267.0 lb

## 2023-09-15 DIAGNOSIS — O0990 Supervision of high risk pregnancy, unspecified, unspecified trimester: Secondary | ICD-10-CM | POA: Diagnosis not present

## 2023-09-15 DIAGNOSIS — Z3A32 32 weeks gestation of pregnancy: Secondary | ICD-10-CM | POA: Diagnosis not present

## 2023-09-15 DIAGNOSIS — Z6841 Body Mass Index (BMI) 40.0 and over, adult: Secondary | ICD-10-CM

## 2023-09-15 NOTE — Progress Notes (Signed)
   PRENATAL VISIT NOTE  Subjective:  Annette Wood is a 33 y.o. [phone] at [redacted]w[redacted]d being seen today for ongoing prenatal care.  She is currently monitored for the following issues for this high-risk pregnancy and has Vitamin D  deficiency; STD exposure; Supervision of high risk pregnancy, antepartum; Rubella non-immune status, antepartum; Obesity affecting pregnancy, antepartum; and BMI 45.0-49.9, adult (HCC) on their problem list.  Patient reports no bleeding, no leaking, and occasional contractions.  Contractions: Not present. Vag. Bleeding: None.  Movement: Present. Denies leaking of fluid.   The following portions of the patient's history were reviewed and updated as appropriate: allergies, current medications, past family history, past medical history, past social history, past surgical history and problem list.   Objective:   Vitals:   09/15/23 0902  BP: 115/79  Pulse: 93  Weight: 267 lb (121.1 kg)    Fetal Status: Fetal Heart Rate (bpm): 149   Movement: Present     General:  Alert, oriented and cooperative. Patient is in no acute distress.  Skin: Skin is warm and dry. No rash noted.   Cardiovascular: Normal heart rate noted  Respiratory: Normal respiratory effort, no problems with respiration noted  Abdomen: Soft, gravid, appropriate for gestational age.  Pain/Pressure: Absent     Pelvic: No cervical exam was performed at this visit.         Extremities: Normal range of motion.  Edema: None  Mental Status: Normal mood and affect. Normal behavior. Normal judgment and thought content.   Assessment and Plan:  Pregnancy: [id number] at [redacted]w[redacted]d 1. Supervision of high risk pregnancy, antepartum (Primary) - Continue routine prenatal care. - Continue ongoing monitoring with routine scans as indicated for high risk pregnancy. Supervision of high risk pregnancy, antepartum  BMI 45.0-49.9, adult (HCC)  [redacted] weeks gestation of pregnancy  Preterm labor symptoms and general obstetric  precautions including but not limited to vaginal bleeding, contractions, leaking of fluid and fetal movement were reviewed in detail with the patient. Please refer to After Visit Summary for other counseling recommendations.   Return in about 2 weeks (around 09/29/2023).  Future Appointments  Date Time Provider Department Center  09/29/2023  9:00 AM [facility] PROVIDER 1 [facility]  09/29/2023  9:30 AM [facility] US3 [facility]  10/06/2023  9:00 AM [facility] PROVIDER 1 [facility]  10/06/2023  9:30 AM [facility] US3 [facility]    Kathryn Parish, Medical Student  Attestation of Attending Supervision of Medical Student: Evaluation and management procedures were performed by the medical student under my supervision and collaboration.  I have reviewed the student's note and chart, and I agree with the management and plan.  Onnie Bilis, MD, FACOG Attending Obstetrician & Gynecologist Faculty Practice, [facility]

## 2023-09-29 ENCOUNTER — Ambulatory Visit: Admitting: Obstetrics and Gynecology

## 2023-09-29 ENCOUNTER — Ambulatory Visit (HOSPITAL_BASED_OUTPATIENT_CLINIC_OR_DEPARTMENT_OTHER): Admitting: Maternal & Fetal Medicine

## 2023-09-29 ENCOUNTER — Encounter: Payer: Self-pay | Admitting: Obstetrics and Gynecology

## 2023-09-29 ENCOUNTER — Other Ambulatory Visit: Payer: Self-pay | Admitting: *Deleted

## 2023-09-29 ENCOUNTER — Ambulatory Visit

## 2023-09-29 ENCOUNTER — Ambulatory Visit: Attending: Obstetrics

## 2023-09-29 VITALS — BP 120/77 | HR 86 | Wt 268.0 lb

## 2023-09-29 VITALS — BP 112/74 | HR 94

## 2023-09-29 DIAGNOSIS — Z3A34 34 weeks gestation of pregnancy: Secondary | ICD-10-CM | POA: Diagnosis not present

## 2023-09-29 DIAGNOSIS — O09899 Supervision of other high risk pregnancies, unspecified trimester: Secondary | ICD-10-CM

## 2023-09-29 DIAGNOSIS — Z2839 Other underimmunization status: Secondary | ICD-10-CM

## 2023-09-29 DIAGNOSIS — O99210 Obesity complicating pregnancy, unspecified trimester: Secondary | ICD-10-CM | POA: Insufficient documentation

## 2023-09-29 DIAGNOSIS — O99213 Obesity complicating pregnancy, third trimester: Secondary | ICD-10-CM

## 2023-09-29 DIAGNOSIS — O10912 Unspecified pre-existing hypertension complicating pregnancy, second trimester: Secondary | ICD-10-CM | POA: Diagnosis not present

## 2023-09-29 DIAGNOSIS — O35EXX0 Maternal care for other (suspected) fetal abnormality and damage, fetal genitourinary anomalies, not applicable or unspecified: Secondary | ICD-10-CM | POA: Insufficient documentation

## 2023-09-29 DIAGNOSIS — E669 Obesity, unspecified: Secondary | ICD-10-CM

## 2023-09-29 DIAGNOSIS — Z362 Encounter for other antenatal screening follow-up: Secondary | ICD-10-CM

## 2023-09-29 DIAGNOSIS — O99212 Obesity complicating pregnancy, second trimester: Secondary | ICD-10-CM | POA: Insufficient documentation

## 2023-09-29 DIAGNOSIS — O358XX0 Maternal care for other (suspected) fetal abnormality and damage, not applicable or unspecified: Secondary | ICD-10-CM | POA: Diagnosis not present

## 2023-09-29 DIAGNOSIS — O0990 Supervision of high risk pregnancy, unspecified, unspecified trimester: Secondary | ICD-10-CM | POA: Diagnosis not present

## 2023-09-29 NOTE — Patient Instructions (Signed)
   Considering Waterbirth? Guide for patients at Center for [facility]) Why consider waterbirth? Gentle birth for babies  Less pain medicine used in labor  May allow for passive descent/less pushing  May reduce perineal tears  More mobility and instinctive maternal position changes  Increased maternal relaxation   Is waterbirth safe? What are the risks of infection, drowning or other complications? Infection:  Very low risk (3.7 % for tub vs 4.8% for bed)  7 in 8000 waterbirths with documented infection  Poorly cleaned equipment most common cause  Slightly lower group B strep transmission rate  Drowning  Maternal:  Very low risk  Related to seizures or fainting  Newborn:  Very low risk. No evidence of increased risk of respiratory problems in multiple large studies  Physiological protection from breathing under water  Avoid underwater birth if there are any fetal complications  Once baby's head is out of the water, keep it out.  Birth complication  Some reports of cord trauma, but risk decreased by bringing baby to surface gradually  No evidence of increased risk of shoulder dystocia. Mothers can usually change positions faster in water than in a bed, possibly aiding the maneuvers to free the shoulder.   There are 2 things you MUST do to have a waterbirth with [facility]: Attend a waterbirth class at [employer] [facility]   3rd Wednesday of every month from 7-9 pm (virtual during COVID) [employer] at [email or web address] or [email or web address] or by calling [phone] Bring [place] the certificate from the class to your prenatal appointment or send via MyChart Meet with a midwife at 36 weeks* to see if you can still plan a waterbirth and to sign the consent.   *We also recommend that you schedule as many of your prenatal visits with a midwife as possible.    Helpful information: You may want to bring a bathing suit top to the hospital  to wear during labor but this is optional.  All other supplies are provided by the hospital. Please arrive at the hospital with signs of active labor, and do not wait at home until late in labor. It takes 45 min- 1 hour for fetal monitoring, and check in to your room to take place, plus transport and filling of the waterbirth tub.    Things that would prevent you from having a waterbirth: Premature, <37wks  Previous cesarean birth  Presence of thick meconium-stained fluid  Multiple gestation (Twins, triplets, etc.)  Uncontrolled diabetes or gestational diabetes requiring medication  Hypertension diagnosed in pregnancy or preexisting hypertension (gestational hypertension, preeclampsia, or chronic hypertension) Fetal growth restriction (your baby measures less than 10th percentile on ultrasound) Heavy vaginal bleeding  Non-reassuring fetal heart rate  Active infection (MRSA, etc.). Group B Strep is NOT a contraindication for waterbirth.  If your labor has to be induced and induction method requires continuous monitoring of the baby's heart rate  Other risks/issues identified by your obstetrical provider   Please remember that birth is unpredictable. Under certain unforeseeable circumstances your provider may advise against giving birth in the tub. These decisions will be made on a case-by-case basis and with the safety of you and your baby as our highest priority.    Updated 08/22/21

## 2023-09-29 NOTE — Progress Notes (Signed)
 Patient information  Patient Name: Annette Wood  Patient MRN:   [id number]  Referring practice: MFM Referring Provider: [facility])  MFM CONSULT  Annette Wood is a 33 y.o. [id number] at [redacted]w[redacted]d here for ultrasound and consultation. Patient Active Problem List   Diagnosis Date Noted   BMI 45.0-49.9, adult (HCC) 09/01/2023   Obesity affecting pregnancy, antepartum 06/18/2023   Rubella non-immune status, antepartum 04/22/2023   Supervision of high risk pregnancy, antepartum 03/31/2023   Vitamin D  deficiency 08/27/2012   Annette Wood is doing well today with no acute concerns.  There is a cystic abdominal lesion seen within the fetal abdomen of a female fetus likely to represent an ovarian cyst.  The mass is located on the left side of the abdomen above the bladder and distinct from the kidney, spleen, bowel or adrenal gland. It anechoic in appearance and measures  4.98 x 4.64 x 4.06 cm. The cyst is simple without evidence of intraechoic debris or layering suggestive of a hemorrhagic ovarian cyst or neoplasia. There is a thin single septation. There is no obvious blood supply within or to the mass.  There is no evidence of the mass connecting to the spleen, kidney or adrenal gland.  It appears to be separate from the bowel. This is most consistent with a fetal ovarian cyst. The amniotic fluid is normal.  She had normal genetic screening with cell free DNA. I reassured the patient that this is unlikely to affect her pregnancy but it should be monitored.   The most common cystic abdominal masses are functional ovarian cysts in female fetuses--approximately 30% are born with small follicular cysts. Sonographically apparent functional ovarian cysts (>2 cm in diameter) have an incidence of approximately 4:10,000 pregnancies and are seen in the second half of pregnancy in response to maternal and placental hormones. Other abnormal intraabdominal cystic masses are less  common. Fetal ovarian cysts result from folliculogenesis, stimulated by elevated placental human chorionic gonadotropin  (hCG) levels, particularly in pregnancies with large placental mass. Cystic abdominal lesions occur secondary to faulty canalization of gut lumen or deformation of otherwise normal fluid-containing structures. The size, number, and location of the cysts, along with the fetal sex, helps determine the presumptive etiology. However, final diagnosis may not be known in 25%-50% of cases pending postnatal investigation. Cysts may have mixed echogenic patterns, which represent internal septations, hemorrhage, or calcifications. Color flow Doppler imaging may be necessary to distinguish cysts from vascular structures (e.g., umbilical varix, portal vein) and to assess for torsion. Fetal MRI may be a useful adjunct to identify the source of the cyst when the diagnosis is unclear on ultrasound. Sensitivity and specificity of prenatal ultrasound to identify the system of origin of abdominal cysts are reported to be 88% and 96%, respectively. Accurate prenatal identification may be unnecessary, unless fetal intervention (e.g., termination, fetal surgery) is planned. Abnormal cystic abdominal masses include a cyst developing on either the gastrointestinal system, hepatobiliary system, renal system or other organs such as the spleen, pancreas, urachus, mesentery, omentum and umbilical vein. Intrabdominal masses can be associated with other congenital malformations and genetic syndromes, although cystic abdominal lesions are often isolated; only one-third have additional structural defects. Polyhydramnios is common, caused by small bowel obstruction secondary to compression by a large cyst, and can increase the risk of preterm delivery. Ascites may be present secondary to ruptured cyst or transudation. Hydronephrosis and/or ureteromegaly can be seen with massively enlarged uterus, with hydrometrocolpos compressing  the ureters. Meconium pseudocyst can be associated with cystic fibrosis.  Approximately 25% of prenatal ovarian cysts will resolve prior to delivery and nearly 50% resolve postdelivery after the maternal hormones dissipate.  If discovered in the first trimester nearly 80% of isolated cystic abdominal masses dissipate during the pregnancy.  Resolution of the cyst is less likely if it is complex or measuring greater than 40 mm in diameter.  Ovarian torsion is also more likely with complex masses and diameter greater than 40 mm.  Approximately one third of liveborn fetuses with a persistent abdominal cyst will require surgical intervention.  The long-term prognosis depends upon the exact type of cystic abdominal lesion and organs involved but is generally good for isolated ovarian cysts.  Perinatal mortality for all intra-abdominal cystic lesions is approximately 5%.  Antenatal management consists of aneuploidy screening if not previously completed, consideration of amniocentesis, detailed ultrasound to rule out other anatomic structures and serial growth ultrasounds and antenatal testing starting around 32 to 34 weeks to assess fetal wellbeing and monitor the growth of the mass.  Large ovarian cysts are at greater risk for torsion, rupture and hemorrhage.  Risk of torsion (35%-45%) and need for postnatal surgery (60%-80%) highest when fetal cyst measures 5-8 cm; cysts larger than 8 cm are less likely to undergo torsion (18%). New fluid levels or mixed echogenicity within ovarian cyst may represent torsion. Echogenic areas within a cyst may represent hemorrhage or clot formation. Cyst aspiration prior to delivery is controversial, as this may cause intracystic bleeding, infection, theoretic seeding of malignancy, and preterm labor. Consider fetal ovarian cyst aspiration if >=5 cm or if cyst diameter increases by >=1 cm/week to reduce risk of postpartum oophorectomy (14% versus 85%; P = .0002). Prenatal ovarian cyst  aspiration can result in intracystic hemorrhage (12.8%; 95% CI, 3.8%-26.0%) and torsion still possible (10.8%; 95% CI, 4.4%-19.7%); 37% will recur during the pregnancy (95% CI, 14.8%-64.3%); and almost 22% require postnatal surgery (95% CI, 0.9%-40.0%). Fetal nonstress and/or biophysical profile testing twice weekly beginning at 32-34 weeks. Prenatal neonatology and pediatric surgery consultation to discuss postnatal management and prognosis. Delivery in tertiary care facility is recommended. Cesarean delivery should be considered for large (>5 cm) cystic abdominal lesions to prevent rupture and soft tissue dystocia.   Sonographic findings Single intrauterine pregnancy. Fetal cardiac activity: Observed. Presentation: Cephalic. Interval fetal anatomy appears normal except for a suspected fetal ovarian cyst measuring 4.98 x 4.64 x 4.06 cm.  Fetal biometry shows the estimated fetal weight at the 41 percentile. Amniotic fluid: Within normal limits.  MVP: 5.48 cm. Placenta: Posterior. BPP: 8/8.   There are limitations of prenatal ultrasound such as the inability to detect certain abnormalities due to poor visualization. Various factors such as fetal position, gestational age and maternal body habitus may increase the difficulty in visualizing the fetal anatomy.    Recommendations -Weekly antenatal testing until delivery -Will add to [facility] list -Vaginal delivery preferred unless there is rapid cyst enlargement -Ok to delivery at [facility] unless there is rapid cyst enlargement -Post natal imaging of the fetal ovarian cyst  Review of Systems: A review of systems was performed and was negative except per HPI   Vitals and Physical Exam    09/29/2023    8:50 AM 09/15/2023    9:02 AM 09/10/2023    2:59 PM  Vitals with BMI  Weight  267 lbs   BMI  47.31   Systolic 112 115 409  Diastolic 74 79 83  Pulse 94 93  100    Sitting comfortably on the sonogram table Nonlabored breathing Normal rate and  rhythm Abdomen is nontender  Past pregnancies OB History  Gravida Para Term Preterm AB Living  7 3 3  0 3 3  SAB IAB Ectopic Multiple Live Births  1 2 0 0 3    # Outcome Date GA Lbr Len/2nd Weight Sex Type Anes PTL Lv  7 Current           6 SAB 08/2021          5 IAB 2020          4 Term 04/01/13 [redacted]w[redacted]d 05:40 / 00:15 7 lb 1.6 oz (3.221 kg) F Vag-Spont EPI  LIV  3 Term 09/20/09 [redacted]w[redacted]d  7 lb 8 oz (3.402 kg) F Vag-Spont EPI N LIV  2 Term 06/09/08 [redacted]w[redacted]d  6 lb 3 oz (2.807 kg) M Vag-Spont None N LIV     Birth Comments: delivered in car  1 IAB              Birth Comments: medication Ab    Obstetric Comments  2020 procedure     I spent 30 minutes reviewing the patients chart, including labs and images as well as counseling the patient about her medical conditions. Greater than 50% of the time was spent in direct face-to-face patient counseling.  Penney Bowling  MFM, [place]   09/29/2023  1:45 PM

## 2023-09-29 NOTE — Progress Notes (Signed)
   PRENATAL VISIT NOTE  Subjective:  Annette Wood is a 33 y.o. [phone] at [redacted]w[redacted]d being seen today for ongoing prenatal care.  She is currently monitored for the following issues for this high-risk pregnancy and has Vitamin D  deficiency; Supervision of high risk pregnancy, antepartum; Rubella non-immune status, antepartum; Obesity affecting pregnancy, antepartum; BMI 45.0-49.9, adult (HCC); and Ovarian cyst, fetal, affecting care of mother on their problem list.  Patient reports no complaints.  Contractions: Not present. Vag. Bleeding: None.  Movement: Present. Denies leaking of fluid.   The following portions of the patient's history were reviewed and updated as appropriate: allergies, current medications, past family history, past medical history, past social history, past surgical history and problem list.   Objective:   Vitals:   09/29/23 1451  BP: 120/77  Pulse: 86  Weight: 268 lb (121.6 kg)     Fetal Status: Fetal Heart Rate (bpm): 152 Fundal Height: 36 cm Movement: Present      General: Alert, oriented and cooperative. Patient is in no acute distress.  Skin: Skin is warm and dry. No rash noted.   Cardiovascular: Normal heart rate noted  Respiratory: Normal respiratory effort, no problems with respiration noted  Abdomen: Soft, gravid, appropriate for gestational age.  Pain/Pressure: Absent     Pelvic: Cervical exam deferred        Extremities: Normal range of motion.  Edema: None  Mental Status: Normal mood and affect. Normal behavior. Normal judgment and thought content.   Assessment and Plan:  Pregnancy: [id number] at [redacted]w[redacted]d 1. Supervision of high risk pregnancy, antepartum (Primary) Patient is doing well Cultures next visit Patient expressed interest in water birth- information provided and patient strongly encouraged to register for the class ASAP. Will schedule next appointment with CNM for counseling Patient planning for BTL- consent previously signed  2. Obesity  affecting pregnancy, antepartum, unspecified obesity type Continue ASA Follow up antenatal testing per MFM Schedule  3. Rubella non-immune status, antepartum Will offer postpartum  4. Ovarian cyst, fetal, affecting care of mother Plan for post-natal follow up  Currently candidate for vaginal delivery given size of cyst Follow up MFM scans   Preterm labor symptoms and general obstetric precautions including but not limited to vaginal bleeding, contractions, leaking of fluid and fetal movement were reviewed in detail with the patient. Please refer to After Visit Summary for other counseling recommendations.   Return in about 2 weeks (around 10/13/2023) for in person, ROB, High risk.  Future Appointments  Date Time Provider Department Center  10/06/2023  9:00 AM [facility] PROVIDER 1 [facility]  10/06/2023  9:30 AM [facility] US3 [facility]  10/14/2023  8:55 AM Tari Fare, CNM [facility] None    Verlyn Goad, MD

## 2023-10-06 ENCOUNTER — Ambulatory Visit

## 2023-10-06 ENCOUNTER — Other Ambulatory Visit: Payer: Self-pay | Admitting: *Deleted

## 2023-10-06 ENCOUNTER — Ambulatory Visit: Admitting: Obstetrics

## 2023-10-06 ENCOUNTER — Ambulatory Visit: Attending: Obstetrics and Gynecology

## 2023-10-06 VITALS — BP 119/76 | HR 82

## 2023-10-06 DIAGNOSIS — O99213 Obesity complicating pregnancy, third trimester: Secondary | ICD-10-CM

## 2023-10-06 DIAGNOSIS — O0990 Supervision of high risk pregnancy, unspecified, unspecified trimester: Secondary | ICD-10-CM | POA: Insufficient documentation

## 2023-10-06 DIAGNOSIS — O09899 Supervision of other high risk pregnancies, unspecified trimester: Secondary | ICD-10-CM | POA: Insufficient documentation

## 2023-10-06 DIAGNOSIS — Z2839 Other underimmunization status: Secondary | ICD-10-CM | POA: Insufficient documentation

## 2023-10-06 DIAGNOSIS — E669 Obesity, unspecified: Secondary | ICD-10-CM | POA: Diagnosis not present

## 2023-10-06 DIAGNOSIS — O358XX0 Maternal care for other (suspected) fetal abnormality and damage, not applicable or unspecified: Secondary | ICD-10-CM | POA: Insufficient documentation

## 2023-10-06 DIAGNOSIS — O35DXX0 Maternal care for other (suspected) fetal abnormality and damage, fetal gastrointestinal anomalies, not applicable or unspecified: Secondary | ICD-10-CM

## 2023-10-06 DIAGNOSIS — O10912 Unspecified pre-existing hypertension complicating pregnancy, second trimester: Secondary | ICD-10-CM | POA: Diagnosis not present

## 2023-10-06 DIAGNOSIS — O35EXX0 Maternal care for other (suspected) fetal abnormality and damage, fetal genitourinary anomalies, not applicable or unspecified: Secondary | ICD-10-CM

## 2023-10-06 DIAGNOSIS — Z3A35 35 weeks gestation of pregnancy: Secondary | ICD-10-CM | POA: Insufficient documentation

## 2023-10-06 DIAGNOSIS — O99212 Obesity complicating pregnancy, second trimester: Secondary | ICD-10-CM | POA: Insufficient documentation

## 2023-10-06 NOTE — Progress Notes (Signed)
 MFM Consult Note  Annette Wood is currently at 35 weeks and 3 days.  She has been followed due to maternal obesity with a BMI of 48 and a history of chronic hypertension that is not treated with any medications.    An fetal abdominal cyst, most likely an ovarian cyst in a female fetus was noted on her last ultrasound exam.    She denies any problems since her last exam.  There was normal amniotic fluid noted on today's exam with a total AFI of 13.25 cm.  A BPP performed today was 8 out of 8.  A 4 to 5 cm cystic structure with a single septation located above the fetal bladder continues to be noted on today's exam.    The patient was advised that in a female fetus, the cystic structure most likely represents an ovarian cyst, although the differential diagnosis may include an enteric duplication cyst or a dilated loop of bowel.  The patient was advised that her baby will need imaging studies of the abdomen after delivery and referral to pediatric surgery.    Should the cyst be an ovarian cyst, due to its large size, the pediatric surgeon will most likely perform surgery to drain the cyst and excise the cyst wall.    I will present her case at tomorrow's [facility] meeting so that the pediatricians will be aware of the cyst.    She will return in 1 week for another BPP.    We will assess the fetal growth in 3 weeks to ensure that the fetal abdomen is not too big so that she can attempt a vaginal birth.  The patient stated that all of her questions were answered today.  A total of 20 minutes was spent counseling and coordinating the care for this patient.  Greater than 50% of the time was spent in direct face-to-face contact.

## 2023-10-07 ENCOUNTER — Other Ambulatory Visit: Payer: Self-pay

## 2023-10-07 DIAGNOSIS — B370 Candidal stomatitis: Secondary | ICD-10-CM

## 2023-10-07 MED ORDER — NYSTATIN 100000 UNIT/ML MT SUSP: 5.0000 mL | Freq: Four times a day (QID) | OROMUCOSAL | 0 refills | Status: DC

## 2023-10-14 ENCOUNTER — Encounter: Payer: Self-pay | Admitting: Certified Nurse Midwife

## 2023-10-14 ENCOUNTER — Other Ambulatory Visit: Payer: Self-pay | Admitting: Certified Nurse Midwife

## 2023-10-14 ENCOUNTER — Ambulatory Visit: Admitting: Certified Nurse Midwife

## 2023-10-14 ENCOUNTER — Other Ambulatory Visit (HOSPITAL_COMMUNITY)
Admission: RE | Admit: 2023-10-14 | Discharge: 2023-10-14 | Disposition: A | Discharge status: Home / Self Care | Source: Ambulatory Visit | Attending: Certified Nurse Midwife | Admitting: Certified Nurse Midwife

## 2023-10-14 VITALS — BP 117/79 | HR 94 | Wt 252.0 lb

## 2023-10-14 DIAGNOSIS — O0993 Supervision of high risk pregnancy, unspecified, third trimester: Secondary | ICD-10-CM

## 2023-10-14 DIAGNOSIS — O99210 Obesity complicating pregnancy, unspecified trimester: Secondary | ICD-10-CM | POA: Diagnosis not present

## 2023-10-14 DIAGNOSIS — Z3A36 36 weeks gestation of pregnancy: Secondary | ICD-10-CM | POA: Insufficient documentation

## 2023-10-14 DIAGNOSIS — O0990 Supervision of high risk pregnancy, unspecified, unspecified trimester: Secondary | ICD-10-CM

## 2023-10-14 DIAGNOSIS — O35EXX0 Maternal care for other (suspected) fetal abnormality and damage, fetal genitourinary anomalies, not applicable or unspecified: Secondary | ICD-10-CM | POA: Diagnosis not present

## 2023-10-14 NOTE — Progress Notes (Signed)
 Pt presents for ROB visit. Pt has concerns about cyst on baby and recent US . Need letter for FOB to be out of work for delivery

## 2023-10-14 NOTE — Progress Notes (Deleted)
 IOL placed.   [name] Annette Singh, MSN, CNM  Center for [facility]  10/14/2023 12:38 PM

## 2023-10-14 NOTE — Progress Notes (Signed)
   PRENATAL VISIT NOTE  Subjective:  Annette Wood is a 33 y.o. [phone] at [redacted]w[redacted]d being seen today for ongoing prenatal care.  She is currently monitored for the following issues for this high-risk pregnancy and has Vitamin D  deficiency; Supervision of high risk pregnancy, antepartum; Rubella non-immune status, antepartum; Obesity affecting pregnancy, antepartum; BMI 45.0-49.9, adult (HCC); and Ovarian cyst, fetal, affecting care of mother on their problem list.  Patient reports no complaints.  Contractions: Not present. Vag. Bleeding: None.  Movement: Present. Denies leaking of fluid.   The following portions of the patient's history were reviewed and updated as appropriate: allergies, current medications, past family history, past medical history, past social history, past surgical history and problem list.   Objective:    Vitals:   10/14/23 0828  BP: 117/79  Pulse: 94  Weight: 252 lb (114.3 kg)    Fetal Status:  Fetal Heart Rate (bpm): 143 Fundal Height: 38 cm Movement: Present    General: Alert, oriented and cooperative. Patient is in no acute distress.  Skin: Skin is warm and dry. No rash noted.   Cardiovascular: Normal heart rate noted  Respiratory: Normal respiratory effort, no problems with respiration noted  Abdomen: Soft, gravid, appropriate for gestational age.  Pain/Pressure: Present     Pelvic: Cervical exam deferred        Extremities: Normal range of motion.  Edema: None  Mental Status: Normal mood and affect. Normal behavior. Normal judgment and thought content.   Assessment and Plan:  Pregnancy: [id number] at [redacted]w[redacted]d 1. Supervision of high risk pregnancy in third trimester  - Patient doing well. - Reports vigorous and frequent fetal movement.    3. [redacted] weeks gestation of pregnancy - Culture, beta strep (group b only) - Cervicovaginal ancillary only( )  4. Ovarian cyst, fetal, affecting care of mother - Plan for follow up fetal Growth in a few weeks to  confirm possibility of vaginal delivery.  - Message sent to MFM   5. Obesity affecting pregnancy, antepartum, unspecified obesity type - Fundal height appropriate for gestational age   Preterm labor symptoms and general obstetric precautions including but not limited to vaginal bleeding, contractions, leaking of fluid and fetal movement were reviewed in detail with the patient. Please refer to After Visit Summary for other counseling recommendations.   Return in about 1 week (around 10/21/2023) for HROB.  Future Appointments  Date Time Provider Department Center  10/22/2023 10:55 AM Gabrielle Joiner, MD [facility] None    Marcell Chavarin Maurie Southern) Marlys Singh, MSN, CNM  Center for [facility]  10/14/2023 9:06 AM     10/15/23- IOL orders in signed and held.   Michelena Culmer Maurie Southern) Marlys Singh, MSN, CNM  Center for [facility]  10/15/2023 11:52 AM

## 2023-10-15 LAB — CERVICOVAGINAL ANCILLARY ONLY
Bacterial Vaginitis (gardnerella): NEGATIVE
Candida Glabrata: NEGATIVE
Candida Vaginitis: NEGATIVE
Chlamydia: NEGATIVE
Comment: NEGATIVE
Comment: NEGATIVE
Comment: NEGATIVE
Comment: NEGATIVE
Comment: NEGATIVE
Comment: NORMAL
Neisseria Gonorrhea: NEGATIVE
Trichomonas: NEGATIVE

## 2023-10-18 LAB — CULTURE, BETA STREP (GROUP B ONLY): Strep Gp B Culture: NEGATIVE

## 2023-10-21 ENCOUNTER — Other Ambulatory Visit: Payer: Self-pay

## 2023-10-21 ENCOUNTER — Encounter (HOSPITAL_COMMUNITY): Payer: Self-pay | Admitting: Obstetrics & Gynecology

## 2023-10-21 ENCOUNTER — Ambulatory Visit: Admitting: *Deleted

## 2023-10-21 ENCOUNTER — Other Ambulatory Visit (INDEPENDENT_AMBULATORY_CARE_PROVIDER_SITE_OTHER): Payer: Self-pay

## 2023-10-21 ENCOUNTER — Inpatient Hospital Stay (HOSPITAL_COMMUNITY)
Admission: AD | Admit: 2023-10-21 | Discharge: 2023-10-24 | DRG: 807 | Disposition: A | Discharge status: Home / Self Care | Attending: Obstetrics & Gynecology | Admitting: Obstetrics & Gynecology

## 2023-10-21 DIAGNOSIS — O99210 Obesity complicating pregnancy, unspecified trimester: Secondary | ICD-10-CM

## 2023-10-21 DIAGNOSIS — O3483 Maternal care for other abnormalities of pelvic organs, third trimester: Secondary | ICD-10-CM | POA: Diagnosis present

## 2023-10-21 DIAGNOSIS — Z3A37 37 weeks gestation of pregnancy: Secondary | ICD-10-CM

## 2023-10-21 DIAGNOSIS — O0993 Supervision of high risk pregnancy, unspecified, third trimester: Secondary | ICD-10-CM

## 2023-10-21 DIAGNOSIS — N83209 Unspecified ovarian cyst, unspecified side: Secondary | ICD-10-CM | POA: Diagnosis present

## 2023-10-21 DIAGNOSIS — O2690 Pregnancy related conditions, unspecified, unspecified trimester: Secondary | ICD-10-CM | POA: Diagnosis not present

## 2023-10-21 DIAGNOSIS — Z3A39 39 weeks gestation of pregnancy: Secondary | ICD-10-CM | POA: Diagnosis not present

## 2023-10-21 DIAGNOSIS — O35EXX0 Maternal care for other (suspected) fetal abnormality and damage, fetal genitourinary anomalies, not applicable or unspecified: Secondary | ICD-10-CM | POA: Diagnosis present

## 2023-10-21 DIAGNOSIS — O9962 Diseases of the digestive system complicating childbirth: Secondary | ICD-10-CM | POA: Diagnosis present

## 2023-10-21 DIAGNOSIS — E66813 Obesity, class 3: Secondary | ICD-10-CM | POA: Diagnosis present

## 2023-10-21 DIAGNOSIS — O99213 Obesity complicating pregnancy, third trimester: Secondary | ICD-10-CM

## 2023-10-21 DIAGNOSIS — O479 False labor, unspecified: Secondary | ICD-10-CM | POA: Diagnosis not present

## 2023-10-21 DIAGNOSIS — Z7982 Long term (current) use of aspirin: Secondary | ICD-10-CM

## 2023-10-21 DIAGNOSIS — Z6841 Body Mass Index (BMI) 40.0 and over, adult: Secondary | ICD-10-CM

## 2023-10-21 DIAGNOSIS — Z3490 Encounter for supervision of normal pregnancy, unspecified, unspecified trimester: Principal | ICD-10-CM

## 2023-10-21 DIAGNOSIS — O4292 Full-term premature rupture of membranes, unspecified as to length of time between rupture and onset of labor: Principal | ICD-10-CM | POA: Diagnosis present

## 2023-10-21 DIAGNOSIS — Z2839 Other underimmunization status: Secondary | ICD-10-CM

## 2023-10-21 DIAGNOSIS — Z8249 Family history of ischemic heart disease and other diseases of the circulatory system: Secondary | ICD-10-CM

## 2023-10-21 DIAGNOSIS — Z833 Family history of diabetes mellitus: Secondary | ICD-10-CM

## 2023-10-21 DIAGNOSIS — O99214 Obesity complicating childbirth: Secondary | ICD-10-CM | POA: Diagnosis present

## 2023-10-21 DIAGNOSIS — K219 Gastro-esophageal reflux disease without esophagitis: Secondary | ICD-10-CM | POA: Diagnosis present

## 2023-10-21 NOTE — Progress Notes (Signed)

## 2023-10-21 NOTE — MAU Note (Signed)
 Annette Wood is a 33 y.o. at [redacted]w[redacted]d here in MAU reporting: ROM around 2315. States that fluid was clear. Onset of ctx about 10 minutes after water broke. Endorses good +FM. Denies VB.   Onset of complaint: 2315 today Pain score: 10/10 There were no vitals filed for this visit.   FHT: 125  Lab orders placed from triage: fern slide

## 2023-10-22 ENCOUNTER — Inpatient Hospital Stay (HOSPITAL_COMMUNITY): Admitting: Anesthesiology

## 2023-10-22 ENCOUNTER — Other Ambulatory Visit: Payer: Self-pay

## 2023-10-22 ENCOUNTER — Encounter: Admitting: Obstetrics and Gynecology

## 2023-10-22 ENCOUNTER — Encounter (HOSPITAL_COMMUNITY): Payer: Self-pay | Admitting: Obstetrics and Gynecology

## 2023-10-22 DIAGNOSIS — Z3A37 37 weeks gestation of pregnancy: Secondary | ICD-10-CM | POA: Diagnosis not present

## 2023-10-22 DIAGNOSIS — O4202 Full-term premature rupture of membranes, onset of labor within 24 hours of rupture: Secondary | ICD-10-CM | POA: Diagnosis not present

## 2023-10-22 DIAGNOSIS — O9962 Diseases of the digestive system complicating childbirth: Secondary | ICD-10-CM | POA: Diagnosis not present

## 2023-10-22 DIAGNOSIS — Z7982 Long term (current) use of aspirin: Secondary | ICD-10-CM | POA: Diagnosis not present

## 2023-10-22 DIAGNOSIS — Z833 Family history of diabetes mellitus: Secondary | ICD-10-CM | POA: Diagnosis not present

## 2023-10-22 DIAGNOSIS — Z3490 Encounter for supervision of normal pregnancy, unspecified, unspecified trimester: Principal | ICD-10-CM

## 2023-10-22 DIAGNOSIS — Z8249 Family history of ischemic heart disease and other diseases of the circulatory system: Secondary | ICD-10-CM | POA: Diagnosis not present

## 2023-10-22 DIAGNOSIS — K219 Gastro-esophageal reflux disease without esophagitis: Secondary | ICD-10-CM | POA: Diagnosis not present

## 2023-10-22 DIAGNOSIS — N83209 Unspecified ovarian cyst, unspecified side: Secondary | ICD-10-CM | POA: Diagnosis not present

## 2023-10-22 DIAGNOSIS — E66813 Obesity, class 3: Secondary | ICD-10-CM | POA: Diagnosis not present

## 2023-10-22 DIAGNOSIS — O99214 Obesity complicating childbirth: Secondary | ICD-10-CM | POA: Diagnosis not present

## 2023-10-22 DIAGNOSIS — O4292 Full-term premature rupture of membranes, unspecified as to length of time between rupture and onset of labor: Secondary | ICD-10-CM | POA: Diagnosis not present

## 2023-10-22 DIAGNOSIS — O3483 Maternal care for other abnormalities of pelvic organs, third trimester: Secondary | ICD-10-CM | POA: Diagnosis not present

## 2023-10-22 DIAGNOSIS — O26893 Other specified pregnancy related conditions, third trimester: Secondary | ICD-10-CM | POA: Diagnosis not present

## 2023-10-22 LAB — CBC
HCT: 34.8 % — ABNORMAL LOW (ref 36.0–46.0)
Hemoglobin: 11.8 g/dL — ABNORMAL LOW (ref 12.0–15.0)
MCH: 29.3 pg (ref 26.0–34.0)
MCHC: 33.9 g/dL (ref 30.0–36.0)
MCV: 86.4 fL (ref 80.0–100.0)
Platelets: 231 10*3/uL (ref 150–400)
RBC: 4.03 MIL/uL (ref 3.87–5.11)
RDW: 13.3 % (ref 11.5–15.5)
WBC: 10 10*3/uL (ref 4.0–10.5)
nRBC: 0 % (ref 0.0–0.2)

## 2023-10-22 LAB — POCT FERN TEST: POCT Fern Test: POSITIVE

## 2023-10-22 LAB — RPR: RPR Ser Ql: NONREACTIVE

## 2023-10-22 LAB — TYPE AND SCREEN
ABO/RH(D): B POS
Antibody Screen: NEGATIVE

## 2023-10-22 MED ORDER — EPHEDRINE 5 MG/ML INJ: 10.0000 mg | INTRAVENOUS | Status: DC | PRN

## 2023-10-22 MED ORDER — LIDOCAINE-EPINEPHRINE (PF) 2 %-1:200000 IJ SOLN
INTRAMUSCULAR | Status: DC | PRN
  Administered 2023-10-22: 5 mL via EPIDURAL

## 2023-10-22 MED ORDER — PHENYLEPHRINE 80 MCG/ML (10ML) SYRINGE FOR IV PUSH (FOR BLOOD PRESSURE SUPPORT)
80.0000 ug | PREFILLED_SYRINGE | INTRAVENOUS | Status: DC | PRN
  Filled 2023-10-22: qty 10

## 2023-10-22 MED ORDER — IBUPROFEN 600 MG PO TABS
600.0000 mg | ORAL_TABLET | Freq: Four times a day (QID) | ORAL | Status: DC
  Administered 2023-10-22 – 2023-10-24 (×8): 600 mg via ORAL
  Filled 2023-10-22 (×8): qty 1

## 2023-10-22 MED ORDER — LACTATED RINGERS IV SOLN: INTRAVENOUS | Status: DC

## 2023-10-22 MED ORDER — OXYTOCIN BOLUS FROM INFUSION
333.0000 mL | Freq: Once | INTRAVENOUS | Status: AC
  Administered 2023-10-22: 333 mL via INTRAVENOUS

## 2023-10-22 MED ORDER — SIMETHICONE 80 MG PO CHEW: 80.0000 mg | CHEWABLE_TABLET | ORAL | Status: DC | PRN

## 2023-10-22 MED ORDER — ONDANSETRON HCL 4 MG PO TABS: 4.0000 mg | ORAL_TABLET | ORAL | Status: DC | PRN

## 2023-10-22 MED ORDER — COCONUT OIL OIL
1.0000 | TOPICAL_OIL | Status: DC | PRN
  Administered 2023-10-22: 1 via TOPICAL

## 2023-10-22 MED ORDER — FENTANYL CITRATE (PF) 100 MCG/2ML IJ SOLN: 100.0000 ug | INTRAMUSCULAR | Status: DC | PRN

## 2023-10-22 MED ORDER — PRENATAL MULTIVITAMIN CH
1.0000 | ORAL_TABLET | Freq: Every day | ORAL | Status: DC
  Administered 2023-10-22 – 2023-10-23 (×2): 1 via ORAL
  Filled 2023-10-22 (×2): qty 1

## 2023-10-22 MED ORDER — MISOPROSTOL 25 MCG QUARTER TABLET: 25.0000 ug | ORAL_TABLET | Freq: Once | ORAL | Status: DC

## 2023-10-22 MED ORDER — FENTANYL-BUPIVACAINE-NACL 0.5-0.125-0.9 MG/250ML-% EP SOLN
12.0000 mL/h | EPIDURAL | Status: DC | PRN
  Administered 2023-10-22: 12 mL/h via EPIDURAL
  Filled 2023-10-22: qty 250

## 2023-10-22 MED ORDER — ONDANSETRON HCL 4 MG/2ML IJ SOLN: 4.0000 mg | INTRAMUSCULAR | Status: DC | PRN

## 2023-10-22 MED ORDER — ZOLPIDEM TARTRATE 5 MG PO TABS: 5.0000 mg | ORAL_TABLET | Freq: Every evening | ORAL | Status: DC | PRN

## 2023-10-22 MED ORDER — WITCH HAZEL-GLYCERIN EX PADS: 1.0000 | MEDICATED_PAD | CUTANEOUS | Status: DC | PRN

## 2023-10-22 MED ORDER — SENNOSIDES-DOCUSATE SODIUM 8.6-50 MG PO TABS
2.0000 | ORAL_TABLET | ORAL | Status: DC
  Administered 2023-10-22: 2 via ORAL
  Filled 2023-10-22: qty 2

## 2023-10-22 MED ORDER — DIPHENHYDRAMINE HCL 25 MG PO CAPS: 25.0000 mg | ORAL_CAPSULE | Freq: Four times a day (QID) | ORAL | Status: DC | PRN

## 2023-10-22 MED ORDER — DIPHENHYDRAMINE HCL 50 MG/ML IJ SOLN: 12.5000 mg | INTRAMUSCULAR | Status: DC | PRN

## 2023-10-22 MED ORDER — SOD CITRATE-CITRIC ACID 500-334 MG/5ML PO SOLN: 30.0000 mL | ORAL | Status: DC | PRN

## 2023-10-22 MED ORDER — DIBUCAINE (PERIANAL) 1 % EX OINT: 1.0000 | TOPICAL_OINTMENT | CUTANEOUS | Status: DC | PRN

## 2023-10-22 MED ORDER — LIDOCAINE HCL (PF) 1 % IJ SOLN: 30.0000 mL | INTRAMUSCULAR | Status: DC | PRN

## 2023-10-22 MED ORDER — PHENYLEPHRINE 80 MCG/ML (10ML) SYRINGE FOR IV PUSH (FOR BLOOD PRESSURE SUPPORT): 80.0000 ug | PREFILLED_SYRINGE | INTRAVENOUS | Status: DC | PRN

## 2023-10-22 MED ORDER — TETANUS-DIPHTH-ACELL PERTUSSIS 5-2.5-18.5 LF-MCG/0.5 IM SUSY: 0.5000 mL | PREFILLED_SYRINGE | Freq: Once | INTRAMUSCULAR | Status: DC

## 2023-10-22 MED ORDER — OXYTOCIN-SODIUM CHLORIDE 30-0.9 UT/500ML-% IV SOLN
2.5000 [IU]/h | INTRAVENOUS | Status: DC
  Administered 2023-10-22: 2.5 [IU]/h via INTRAVENOUS
  Filled 2023-10-22: qty 500

## 2023-10-22 MED ORDER — OXYCODONE-ACETAMINOPHEN 5-325 MG PO TABS: 2.0000 | ORAL_TABLET | ORAL | Status: DC | PRN

## 2023-10-22 MED ORDER — FENTANYL CITRATE (PF) 100 MCG/2ML IJ SOLN
INTRAMUSCULAR | Status: AC
  Filled 2023-10-22: qty 2

## 2023-10-22 MED ORDER — MISOPROSTOL 50MCG HALF TABLET: 50.0000 ug | ORAL_TABLET | Freq: Once | ORAL | Status: DC

## 2023-10-22 MED ORDER — LACTATED RINGERS IV SOLN
500.0000 mL | INTRAVENOUS | Status: DC | PRN
  Administered 2023-10-22: 500 mL via INTRAVENOUS

## 2023-10-22 MED ORDER — TERBUTALINE SULFATE 1 MG/ML IJ SOLN: 0.2500 mg | Freq: Once | INTRAMUSCULAR | Status: DC | PRN

## 2023-10-22 MED ORDER — LACTATED RINGERS IV SOLN: 500.0000 mL | Freq: Once | INTRAVENOUS | Status: DC

## 2023-10-22 MED ORDER — ONDANSETRON HCL 4 MG/2ML IJ SOLN: 4.0000 mg | Freq: Four times a day (QID) | INTRAMUSCULAR | Status: DC | PRN

## 2023-10-22 MED ORDER — OXYTOCIN-SODIUM CHLORIDE 30-0.9 UT/500ML-% IV SOLN: 1.0000 m[IU]/min | INTRAVENOUS | Status: DC

## 2023-10-22 MED ORDER — BUPIVACAINE HCL (PF) 0.25 % IJ SOLN
INTRAMUSCULAR | Status: DC | PRN
  Administered 2023-10-22 (×2): 5 mL via EPIDURAL

## 2023-10-22 MED ORDER — ACETAMINOPHEN 325 MG PO TABS: 650.0000 mg | ORAL_TABLET | ORAL | Status: DC | PRN

## 2023-10-22 MED ORDER — OXYCODONE-ACETAMINOPHEN 5-325 MG PO TABS: 1.0000 | ORAL_TABLET | ORAL | Status: DC | PRN

## 2023-10-22 MED ORDER — BENZOCAINE-MENTHOL 20-0.5 % EX AERO: 1.0000 | INHALATION_SPRAY | CUTANEOUS | Status: DC | PRN

## 2023-10-22 NOTE — Anesthesia Preprocedure Evaluation (Signed)
 Anesthesia Evaluation  Patient identified by MRN, date of birth, ID band  Reviewed: Allergy & Precautions, NPO status , Patient's Chart, lab work & pertinent test results  Airway Mallampati: III  TM Distance: >3 FB     Dental no notable dental hx.    Pulmonary neg pulmonary ROS   breath sounds clear to auscultation       Cardiovascular negative cardio ROS  Rhythm:Regular Rate:Normal     Neuro/Psych   Anxiety        GI/Hepatic ,GERD  ,,  Endo/Other    Class 3 obesity  Renal/GU      Musculoskeletal   Abdominal   Peds  Hematology   Anesthesia Other Findings   Reproductive/Obstetrics (+) Pregnancy                             Anesthesia Physical Anesthesia Plan  ASA: 2  Anesthesia Plan: Epidural   Post-op Pain Management:    Induction:   PONV Risk Score and Plan:   Airway Management Planned:   Additional Equipment:   Intra-op Plan:   Post-operative Plan:   Informed Consent: I have reviewed the patients History and Physical, chart, labs and discussed the procedure including the risks, benefits and alternatives for the proposed anesthesia with the patient or authorized representative who has indicated his/her understanding and acceptance.       Plan Discussed with:   Anesthesia Plan Comments:        Anesthesia Quick Evaluation

## 2023-10-22 NOTE — Lactation Note (Signed)
 Lactation Consultation Note  Patient Name: Annette Wood [id number] Date: 10/22/2023 Age:33 y.o.  Reason for consult: Initial assessment;Early term 37-38.6wks  P4, [redacted]w[redacted]d  Initial LC visit to mom and baby girl "Sequ[place]ia". Mother has been formula feeding because she does not feel she any milk in her breast. She briefly breast fed her other children and recalls producing milk (other children are teenagers). She states she wants to breast feed this baby or give her breast milk.   Mother wants to pump and DEBP was set up. Mother was fitted with 18 mm flanges. She was taught how to use the hand pump. Coconut oil provided to use with pumping. Instructed mother on the use, frequency, cleaning of breast pump and storage of breast milk.   Baby alert and rooting. Mother receptive to latch assist. Basic breastfeeding education and baby positioned in football hold with pillow support. Baby latched well and mother taught hand compression to increase colostrum intake.    Discussed the process of milk production, "supply and demand" and the importance of breast stimulation and milk removal in order to make an optimal milk supply. Discussed mother to breastfeed 8-12 times in 24 hours, skin to skin and breast feed before formula feeding.   If missed feedings at breast or substituting feeding with formula, advised to hand express and/or pump to remove milk from the breast.    Maternal Data Has patient been taught Hand Expression?: Yes Does the patient have breastfeeding experience prior to this delivery?: Yes How long did the patient breastfeed?: 2 months with each of her other children- stopped due to "painful when they would latch"  Feeding Mother's Current Feeding Choice: Breast Milk and Formula  LATCH Score Latch: Grasps breast easily, tongue down, lips flanged, rhythmical sucking.  Audible Swallowing: A few with stimulation  Type of Nipple: Everted at rest and after stimulation  Comfort  (Breast/Nipple): Soft / non-tender  Hold (Positioning): Assistance needed to correctly position infant at breast and maintain latch.  LATCH Score: 8   Lactation Tools Discussed/Used  DEBP  Interventions Interventions: Breast feeding basics reviewed;Assisted with latch;Skin to skin;Hand express;Breast compression;Adjust position;Support pillows;Position options;LC Services brochure;Education;CDC milk storage guidelines;CDC Guidelines for Breast Pump Cleaning  Discharge Discharge Education: Engorgement and breast care Pump: DEBP;Personal (Spectra  II)  Consult Status Consult Status: Complete    Annette Wood 10/22/2023, 5:38 PM

## 2023-10-22 NOTE — Progress Notes (Signed)
 Patient ID: Annette Wood, female   DOB: 1991/02/12, 33 y.o.   MRN: [id number] Progressing well  Comfortable with epidural  Vitals:   10/22/23 0157 10/22/23 0202 10/22/23 0431 10/22/23 0502  BP: 126/65 115/85 118/80 117/81  Pulse: (!) 115 98 96 94  Resp:  16 17 16   Temp:  98.1 F (36.7 C)    TempSrc:  Oral    SpO2:      Weight:      Height:       FHR stable  Dilation: 9 Effacement (%): 90 Station: Plus 1 Presentation: Vertex Exam by:: Tiana Flurry RN  Patient states would like to nap for now

## 2023-10-22 NOTE — Progress Notes (Signed)
 Patient ID: Annette Wood, female   DOB: 05-07-1991, 33 y.o.   MRN: [id number] Vitals:   10/22/23 0147 10/22/23 0152 10/22/23 0157 10/22/23 0202  BP: 134/77 133/74 126/65 115/85  Pulse: 99 (!) 101 (!) 115 98  Resp:    16  Temp:    98.1 F (36.7 C)  TempSrc:    Oral  SpO2:      Weight:      Height:       FHR stable and reassuring UCs irregular  Will continue expectant management for now

## 2023-10-22 NOTE — Discharge Summary (Signed)
 Postpartum Discharge Summary  Date of Service updated***     Patient Name: Annette Wood DOB: 1990-11-09 MRN: [id number]  Date of admission: 10/21/2023 Delivery date:10/22/2023 Delivering provider: Harlee Lichtenstein Date of discharge: 10/22/2023  Admitting diagnosis: Pregnancy [Z34.90] Intrauterine pregnancy: [redacted]w[redacted]d     Secondary diagnosis:  Principal Problem:   Pregnancy Active Problems:   Rubella non-immune status, antepartum   Obesity affecting pregnancy, antepartum   BMI 45.0-49.9, adult (HCC)   Ovarian cyst, fetal, affecting care of mother   Indication for care in labor or delivery   Vaginal delivery  Additional problems: Spontaneous Rupture of Membranes,  Desires BTLigation    Discharge diagnosis: Fetal Ovarian Cyst                                              Post partum procedures:{Postpartum procedures:23558} Augmentation: N/A Complications: None  Hospital course: Onset of Labor With Vaginal Delivery      33 y.o. yo [id number] at [redacted]w[redacted]d was admitted in Latent Labor on 10/21/2023. Labor course was complicated by nothing.  Steady progress without need for augmentation  Membrane Rupture Time/Date: 11:11 PM,10/21/2023  Delivery Method:Vaginal, Spontaneous Operative Delivery:N/A Episiotomy: None Lacerations:  None Patient had a postpartum course complicated by ***.  She is ambulating, tolerating a regular diet, passing flatus, and urinating well. Patient is discharged home in stable condition on 10/22/23.  Newborn Data: Birth date:10/22/2023 Birth time:6:23 AM Gender:Female Living status:Living Apgars:9 ,9  Weight:   Magnesium Sulfate received: No BMZ received: No Rhophylac:N/A MMR:No T-DaP:08/18/23 Flu: No RSV Vaccine received: No Transfusion:{Transfusion received:30440034} Immunizations administered: Immunization History  Administered Date(s) Administered   HPV 9-valent 08/05/2017, 09/26/2017, 02/16/2018   Influenza Split 01/25/2013   Tdap 09/07/2019,  08/18/2023    Physical exam  Vitals:   10/22/23 0502 10/22/23 0532 10/22/23 0602 10/22/23 0632  BP: 117/81 121/72 124/78 132/79  Pulse: 94 99 91 98  Resp: 16 16  15   Temp:      TempSrc:      SpO2:      Weight:      Height:       General: {Exam; general:21111117} Lochia: {Desc; appropriate/inappropriate:30686::"appropriate"} Uterine Fundus: {Desc; firm/soft:30687} Incision: {Exam; incision:21111123} DVT Evaluation: {Exam; dvt:2111122} Labs: Lab Results  Component Value Date   WBC 10.0 10/22/2023   HGB 11.8 (L) 10/22/2023   HCT 34.8 (L) 10/22/2023   MCV 86.4 10/22/2023   PLT 231 10/22/2023      Latest Ref Rng & Units 04/21/2023   10:48 AM  CMP  Glucose 70 - 99 mg/dL 308   BUN 6 - 20 mg/dL 8   Creatinine 6.57 - 8.46 mg/dL 9.62   Sodium 952 - 841 mmol/L 137   Potassium 3.5 - 5.2 mmol/L 4.1   Chloride 96 - 106 mmol/L 103   CO2 20 - 29 mmol/L 20   Calcium 8.7 - 10.2 mg/dL 9.6   Total Protein 6.0 - 8.5 g/dL 7.1   Total Bilirubin 0.0 - 1.2 mg/dL <3.2   Alkaline Phos 44 - 121 IU/L 73   AST 0 - 40 IU/L 15   ALT 0 - 32 IU/L 21    Edinburgh Score:    09/20/2021    8:19 AM  Edinburgh Postnatal Depression Scale Screening Tool  I have been able to laugh and see the funny side of things. 0  I  have looked forward with enjoyment to things. 0  I have blamed myself unnecessarily when things went wrong. 0  I have been anxious or worried for no good reason. 0  I have felt scared or panicky for no good reason. 0  Things have been getting on top of me. 0  I have been so unhappy that I have had difficulty sleeping. 0  I have felt sad or miserable. 0  I have been so unhappy that I have been crying. 0  The thought of harming myself has occurred to me. 0  Edinburgh Postnatal Depression Scale Total 0      After visit meds:  Allergies as of 10/22/2023   No Known Allergies   Med Rec must be completed prior to using this [facility]***        Discharge home in stable  condition Infant Feeding: Breast Infant Disposition:{CHL IP OB HOME WITH ZOXWRU:04540} Discharge instruction: per After Visit Summary and Postpartum booklet. Activity: Advance as tolerated. Pelvic rest for 6 weeks.  Diet: routine diet Anticipated Birth Control: BTL Postpartum Appointment:{Outpatient follow up:23559} Additional Postpartum F/U: {PP Procedure:23957} Future Appointments:No future appointments. Follow up Visit:      10/22/2023 Holmes Lusher, CNM

## 2023-10-22 NOTE — H&P (Signed)
 Obstetric History and Physical  Annamary ELIJAH MICHAELIS is a 33 y.o. [phone] with IUP at [redacted]w[redacted]d presenting with SROM. Occurred about 2315 on 10/21/23, clear fluid noted.  Patient states she has been having  irregular contractions starting ten minutes after rupture, no vaginal bleeding, with active fetal movement.    Prenatal Course Source of Care: [facility]  with onset of care at 6 weeks Pregnancy complications or risks: Patient Active Problem List   Diagnosis Date Noted   Pregnancy 10/22/2023   Ovarian cyst, fetal, affecting care of mother 09/29/2023   BMI 45.0-49.9, adult (HCC) 09/01/2023   Obesity affecting pregnancy, antepartum 06/18/2023   Rubella non-immune status, antepartum 04/22/2023   Supervision of high risk pregnancy, antepartum 03/31/2023   Vitamin D  deficiency 08/27/2012   NURSING  PROVIDER  Office Location [facility] Dating by LMP c/w U/S at 8 wks  [facility] Model Traditional Anatomy U/S normal  Initiated care at  [employer]  English              LAB RESULTS   Support Person  FOB Genetics NIPS: LR female AFP: neg    Carrier Screen Horizon: neg  Rhogam  B/Positive/-- (12/02 1048) A1C/GTT Early: A1C 5.6 Third trimester: nl 2 hour  Flu Vaccine No    TDaP Vaccine 08/18/2023 Blood Type B/Positive/-- (12/02 1048)  Covid Vaccine No Antibody Negative (12/02 1048)  RSV Vaccine No Rubella <0.90 (12/02 1048)  Feeding Plan breast RPR Non Reactive (03/31 0908)  Contraception bilateral tubal ligation HBsAg Negative (12/02 1048)  Circumcision Girl HIV Non Reactive (03/31 0908)  Pediatrician   [facility] HCVAb Non Reactive (12/02 1048)  Prenatal Classes       Pap Diagnosis  Date Value Ref Range Status  08/28/2022   Final   - Negative for intraepithelial lesion or malignancy (NILM)    BTL Consent  08/18/23 GC/CT Initial:  neg 36wks:  neg  VBAC Consent  GBS Negative/-- (05/27 1044)   Past Medical History:  Diagnosis Date   Chlamydia    Gallstones    Gastroesophageal  reflux    Gonorrhea    Panic attack     Past Surgical History:  Procedure Laterality Date   CHOLECYSTECTOMY N/A 11/24/2012   Procedure: LAPAROSCOPIC CHOLECYSTECTOMY WITH INTRAOPERATIVE CHOLANGIOGRAM;  Surgeon: Quitman Bucy, MD;  Location: [facility];  Service: General;  Laterality: N/A;   DILATION AND EVACUATION N/A 09/06/2021   Procedure: DILATATION AND EVACUATION;  Surgeon: Abigail Abler, MD;  Location: [facility] OR;  Service: Gynecology;  Laterality: N/A;   THERAPEUTIC ABORTION      OB History  Gravida Para Term Preterm AB Living  7 3 3  0 3 3  SAB IAB Ectopic Multiple Live Births  1 2 0 0 3    # Outcome Date GA Lbr Len/2nd Weight Sex Type Anes PTL Lv  7 Current           6 SAB 08/2021          5 IAB 2020          4 Term 04/01/13 [redacted]w[redacted]d 05:40 / 00:15 3221 g F Vag-Spont EPI  LIV  3 Term 09/20/09 [redacted]w[redacted]d  3402 g F Vag-Spont EPI N LIV  2 Term 06/09/08 [redacted]w[redacted]d  2807 g M Vag-Spont None N LIV     Birth Comments: delivered in car  1 IAB  Birth Comments: medication Ab    Obstetric Comments  2020 procedure    Social History   Socioeconomic History   Marital status: Single    Spouse name: Not on file   Number of children: Not on file   Years of education: Not on file   Highest education level: Not on file  Occupational History   Not on file  Tobacco Use   Smoking status: Never   Smokeless tobacco: Never  Vaping Use   Vaping status: Never Used  Substance and Sexual Activity   Alcohol use: Not Currently    Comment: rarely   Drug use: No   Sexual activity: Not Currently    Partners: Male    Birth control/protection: None  Other Topics Concern   Not on file  Social History Narrative   Not on file   Social Drivers of Health   Financial Resource Strain: Not on file  Food Insecurity: Not on file  Transportation Needs: Not on file  Physical Activity: Not on file  Stress: Not on file  Social Connections: Not on file    Family History  Problem Relation Age  of Onset   Heart attack Mother    Healthy Father    Diabetes Maternal Grandmother    Lupus Maternal Grandmother     Medications Prior to Admission  Medication Sig Dispense Refill Last Dose/Taking   Prenatal Vit-Fe Phos-FA-Omega (VITAFOL  GUMMIES) 3.33-0.333-34.8 MG CHEW Chew 3 tablets by mouth daily. 90 tablet 11 10/20/2023   aspirin  EC 81 MG tablet Take 2 tablets (162 mg total) by mouth daily. Swallow whole. 60 tablet 12 More than a month   Blood Pressure Monitoring (BLOOD PRESSURE KIT) DEVI 1 Device by Does not apply route once a week. (Patient not taking: Reported on 06/16/2023) 1 each 0    Ferric Maltol  (ACCRUFER ) 30 MG CAPS Take 1 capsule (30 mg total) by mouth daily with breakfast. (Patient not taking: Reported on 10/14/2023) 30 capsule 3    nystatin  (MYCOSTATIN ) 100000 UNIT/ML suspension Take 5 mLs (500,000 Units total) by mouth 4 (four) times daily. (Patient not taking: Reported on 10/14/2023) 60 mL 0     No Known Allergies  Review of Systems: Negative except for what is mentioned in HPI.  Physical Exam: BP 108/83   Pulse 95   Temp 98 F (36.7 C) (Oral)   Resp 16   Ht 5\' 3"  (1.6 m)   LMP 01/31/2023   SpO2 100%   BMI 44.64 kg/m  CONSTITUTIONAL: Well-developed, well-nourished female in no acute distress.  HENT:  Normocephalic, atraumatic, External right and left ear normal. Oropharynx is clear and moist EYES: Conjunctivae and EOM are normal. Pupils are equal, round, and reactive to light. No scleral icterus.  NECK: Normal range of motion, supple, no masses SKIN: Skin is warm and dry. No rash noted. Not diaphoretic. No erythema. No pallor. NEUROLOGIC: Alert and oriented to person, place, and time. Normal reflexes, muscle tone coordination. No cranial nerve deficit noted. PSYCHIATRIC: Normal mood and affect. Normal behavior. Normal judgment and thought content. CARDIOVASCULAR: Normal heart rate noted, regular rhythm RESPIRATORY: Effort and breath sounds normal, no problems with  respiration noted ABDOMEN: Soft, nontender, nondistended, gravid. MUSCULOSKELETAL: Normal range of motion. No edema and no tenderness. 2+ distal pulses.  Cervical Exam: Dilation: 1.5 Effacement (%): 80 Station: -3 Presentation: Vertex Exam by:: Hulda Mage, RN  Presentation: cephalic, verified on bedside ultrasound FHT:  Baseline rate 135 bpm   Variability moderate  Accelerations present  Decelerations none Contractions: Every 2-3 mins   Pertinent Labs/Studies:   Results for orders placed or performed during the hospital encounter of 10/21/23 (from the past 24 hours)  POCT fern test     Status: None   Collection Time: 10/22/23 12:05 AM  Result Value Ref Range   POCT Fern Test Positive = ruptured amniotic membanes   CBC     Status: Abnormal   Collection Time: 10/22/23 12:31 AM  Result Value Ref Range   WBC 10.0 4.0 - 10.5 K/uL   RBC 4.03 3.87 - 5.11 MIL/uL   Hemoglobin 11.8 (L) 12.0 - 15.0 g/dL   HCT 96.0 (L) 45.4 - 09.8 %   MCV 86.4 80.0 - 100.0 fL   MCH 29.3 26.0 - 34.0 pg   MCHC 33.9 30.0 - 36.0 g/dL   RDW 11.9 14.7 - 82.9 %   Platelets 231 150 - 400 K/uL   nRBC 0.0 0.0 - 0.2 %  Type and screen [facility]     Status: None (Preliminary result)   Collection Time: 10/22/23 12:31 AM  Result Value Ref Range   ABO/RH(D) PENDING    Antibody Screen PENDING    Sample Expiration      10/25/2023,2359 Performed at [facility], [address], [place], [place] [address]    US  Fetal BPP W/O Non Stress Result Date: 10/21/2023 ----------------------------------------------------------------------  OBSTETRICS REPORT                       (Signed Final 10/21/2023 02:34 pm) ---------------------------------------------------------------------- Patient Info  ID #:       [id number]                          D.O.B.:  [birth year] (32 yrs)(F)  Name:       Wray Heady Filo                 Visit Date: 10/21/2023 01:48 pm  ---------------------------------------------------------------------- Performed By  Attending:        Verlyn Goad MD      Ref. Address:     [address]                                                             [place], [place]                                                             [address]  Performed By:     Bettey Browning RN     [facility]                                                             [facility]  Referred By:      [facility] ---------------------------------------------------------------------- Orders  #  Description                           Code        Ordered By  1  US  FETAL BPP WO NON STRESS            76819.0     PEGGY CONSTANT ----------------------------------------------------------------------  #  Order #                     Accession #                Episode #  1  [id number]                   [id number]                 [id number] ---------------------------------------------------------------------- Indications  [redacted] weeks gestation of pregnancy                Z3A.37  Obesity complicating pregnancy                 O99.210 E66.9 ---------------------------------------------------------------------- Fetal Evaluation  Num Of Fetuses:         1  Fetal Heart Rate(bpm):  132  Cardiac Activity:       Observed  Presentation:           Cephalic  Amniotic Fluid  AFI FV:      Within normal limits  AFI Sum(cm)     %Tile       Largest Pocket(cm)  13.44           50          5.12  RUQ(cm)       RLQ(cm)       LUQ(cm)        LLQ(cm)  3.2           3.46          5.12           1.66 ---------------------------------------------------------------------- Biophysical Evaluation  Amniotic F.V:   Within normal limits       F. Tone:        Observed  F. Movement:    Observed                    Score:          8/8  F. Breathing:   Observed ---------------------------------------------------------------------- OB History  Gravidity:    7         Term:   3         SAB:   1  TOP:  2        Living:  3 ---------------------------------------------------------------------- Gestational Age  LMP:           37w 4d        Date:  01/31/23                  EDD:   11/07/23  Best:          37w 4d     Det. By:  LMP  (01/31/23)          EDD:   11/07/23 ---------------------------------------------------------------------- Impression  Viable fetus in cephalic presentation with BPP 8/8 ---------------------------------------------------------------------- Recommendations  Continue routine prenatal care and antenatal testing as  scheduled ----------------------------------------------------------------------                Verlyn Goad, MD Electronically Signed Final Report   10/21/2023 02:34 pm ----------------------------------------------------------------------   US  MFM FETAL BPP WO NON STRESS Result Date: 10/06/2023 ----------------------------------------------------------------------  OBSTETRICS REPORT                       (Signed Final 10/06/2023 11:19 am) ---------------------------------------------------------------------- Patient Info  ID #:       [id number]                          D.O.B.:  01-02-1991 (32 yrs)(F)  Name:       Wray Heady Micucci                 Visit Date: 10/06/2023 09:11 am ---------------------------------------------------------------------- Performed By  Attending:        Sal Crass MD         Ref. Address:     [address]                                                             [place], [place]                                                             [address]  Performed By:     Marybeth Smock         [facility]                                   [facility]  Referred By:      [facility] ---------------------------------------------------------------------- Orders  #  Description                           Code        Ordered By  1  US  MFM FETAL BPP WO NON               95621.30    YU FANG     STRESS ----------------------------------------------------------------------  #  Order #                     Accession #                Episode #  1  [id number]                   [id number]                 [id number] ---------------------------------------------------------------------- Indications  Obesity complicating pregnancy, third          O99.213  trimester (BMI 47)  Fetal abnormality - other known or             O35.8XX0  suspected (Suspected fetal ovarian cyst)  Medical complication of pregnancy (History     O26.90  of Hypertension)  [redacted] weeks gestation of pregnancy                Z3A.35  Encounter for other antenatal screening        Z36.2  follow-up  LR female, neg horizon, neg AFP, 2hr GTT  WNL ---------------------------------------------------------------------- Vital Signs  BP:          119/76 ---------------------------------------------------------------------- Fetal Evaluation  Num Of Fetuses:         1  Fetal Heart Rate(bpm):  138  Cardiac Activity:       Observed  Presentation:           Cephalic  Placenta:               Posterior  P. Cord Insertion:      Previously seen  Amniotic Fluid  AFI FV:      Within normal limits  AFI Sum(cm)     %Tile       Largest Pocket(cm)  13.25           45          4  RUQ(cm)       RLQ(cm)       LUQ(cm)        LLQ(cm)  2.17          3.37          4              3.71 ---------------------------------------------------------------------- Biophysical Evaluation  Amniotic F.V:   Within normal limits       F. Tone:        Observed  F. Movement:    Observed                   Score:  8/8  F. Breathing:   Observed ---------------------------------------------------------------------- Biometry  LV:        3.8  mm ---------------------------------------------------------------------- OB History  Gravidity:    7         Term:   3         SAB:   1  TOP:          2        Living:  3 ---------------------------------------------------------------------- Gestational Age  LMP:           35w 3d        Date:  01/31/23                  EDD:   11/07/23  Best:          Shlomo Downer 3d     Det. By:  LMP  (01/31/23)          EDD:   11/07/23 ---------------------------------------------------------------------- Anatomy  Ventricles:            Appears normal         Stomach:                Appears normal, left                                                                        sided  Heart:                 Appears normal         Kidneys:                Appear normal                         (4CH, axis, and                         situs)  Diaphragm:             Appears normal         Bladder:                Appears normal ---------------------------------------------------------------------- Comments  Henery Locket is currently at 35 weeks and 3 days.  She has  been followed due to maternal obesity with a BMI of 48 and a  history of chronic hypertension that is not treated with any  medications.  An fetal abdominal cyst, most likely an ovarian cyst in a  female fetus was noted on her last ultrasound exam.  She denies any problems since her last exam.  There was normal amniotic fluid noted on today's exam with a  total AFI of 13.25 cm.  A BPP performed today was 8 out of 8.  A 4 to 5 cm cystic structure with a single septation located  above the fetal bladder continues to be noted on today's  exam.  The patient was advised that in a female fetus, the cystic  structure most likely represents an ovarian cyst, although the  differential diagnosis may include an enteric duplication cyst  or a dilated loop of bowel.  The  patient was advised that her baby will need imaging  studies of the abdomen after  delivery and referral to pediatric  surgery.  Should the cyst be an ovarian cyst, due to its large size, the  pediatric surgeon will most likely perform surgery to drain the  cyst and excise the cyst wall.  I will present her case at tomorrow's [facility] meeting so that  the pediatricians will be aware of the cyst.  She will return in 1 week for another BPP.  We will assess the fetal growth in 3 weeks to ensure that the  fetal abdomen is not too big so that she can attempt a vaginal  birth.  The patient stated that all of her questions were answered  today.  A total of 20 minutes was spent counseling and coordinating  the care for this patient.  Greater than 50% of the time was  spent in direct face-to-face contact. ----------------------------------------------------------------------                   Sal Crass, MD Electronically Signed Final Report   10/06/2023 11:19 am ----------------------------------------------------------------------   US  MFM FETAL BPP WO NON STRESS Result Date: 09/29/2023 ----------------------------------------------------------------------  OBSTETRICS REPORT                       (Signed Final 09/29/2023 01:56 pm) ---------------------------------------------------------------------- Patient Info  ID #:       [id number]                          D.O.B.:  1991/04/05 (32 yrs)(F)  Name:       Wray Heady Laverdure                 Visit Date: 09/29/2023 09:37 am ---------------------------------------------------------------------- Performed By  Attending:        Penney Bowling DO       Ref. Address:     [address]                                                             [place], [place]                                                             [address]  Performed By:     Elspeth Hals       [facility]                                     [facility]  Referred By:      [facility] ---------------------------------------------------------------------- Orders  #  Description                           Code        Ordered By  1  US  MFM FETAL BPP WO NON               76819.01    YU FANG     STRESS  2  US  MFM OB FOLLOW UP                   [id number]    YU FANG ----------------------------------------------------------------------  #  Order #                     Accession #                Episode #  1  [id number]                   [id number]                 [id number]  2  [id number]                   [id number]                 [id number] ---------------------------------------------------------------------- Indications  Obesity complicating pregnancy, third          O99.213  trimester (BMI 47)  Fetal abnormality - other known or             O35.8XX0  suspected (Suspected fetal ovarian cyst)  Medical complication of pregnancy (History     O26.90  of Hypertension)  Encounter for other antenatal screening        Z36.2  follow-up  [redacted] weeks gestation of pregnancy                Z3A.34  LR female, neg horizon, neg AFP, 2hr GTT  WNL ---------------------------------------------------------------------- Vital Signs  BP:          112/74 ---------------------------------------------------------------------- Fetal Evaluation  Num Of Fetuses:         1  Fetal Heart Rate(bpm):  158  Cardiac Activity:       Observed  Presentation:           Cephalic  Placenta:               Posterior  P. Cord Insertion:      Previously seen  Amniotic Fluid  AFI FV:      Within normal limits  AFI Sum(cm)     %Tile       Largest Pocket(cm)  14.87           53          5.48  RUQ(cm)       RLQ(cm)       LUQ(cm)        LLQ(cm)  3.33          1.48  5.48           4.58  ---------------------------------------------------------------------- Biophysical Evaluation  Amniotic F.V:   Pocket => 2 cm             F. Tone:        Observed  F. Movement:    Observed                   Score:          8/8  F. Breathing:   Observed ---------------------------------------------------------------------- Biometry  BPD:      82.6  mm     G. Age:  33w 2d         17  %    CI:        74.95   %    70 - 86                                                          FL/HC:      21.4   %    19.4 - 21.8  HC:      302.7  mm     G. Age:  33w 4d          6  %    HC/AC:      0.97        0.96 - 1.11  AC:      311.3  mm     G. Age:  35w 0d         73  %    FL/BPD:     78.6   %    71 - 87  FL:       64.9  mm     G. Age:  33w 3d         18  %    FL/AC:      20.8   %    20 - 24  Est. FW:    2396  gm      5 lb 5 oz     41  % ---------------------------------------------------------------------- OB History  Gravidity:    7         Term:   3         SAB:   1  TOP:          2        Living:  3 ---------------------------------------------------------------------- Gestational Age  LMP:           34w 3d        Date:  01/31/23                  EDD:   11/07/23  U/S Today:     33w 6d                                        EDD:   11/11/23  Best:          34w 3d     Det. By:  LMP  (01/31/23)          EDD:   11/07/23 ---------------------------------------------------------------------- Anatomy  Cranium:               Appears normal  Stomach:                Appears normal, left                                                                        sided  Thoracic:              Appears normal         Abdomen:                Ovarian cyst  Heart:                 Appears normal         Kidneys:                Appear normal                         (4CH, axis, and                         situs)  Diaphragm:             Appears normal         Bladder:                Appears normal  Other:  Cystic structure in abdomen measures 5 x 4.6 x  4.1cm ---------------------------------------------------------------------- Comments  Zahara M Berthelot is doing well today with no acute concerns.  There is a cystic abdominal lesion seen within the fetal  abdomen of a female fetus likely to represent an ovarian cyst.  The mass is located on the left side of the abdomen above  the bladder and distinct from the kidney, spleen, bowel or  adrenal gland. It anechoic in appearance and measures  4.98  x 4.64 x 4.06 cm. The cyst is simple without evidence of  intraechoic debris or layering suggestive of a hemorrhagic  ovarian cyst or neoplasia. There is a thin single septation.  There is no obvious blood supply within or to the mass.  There is no evidence of the mass connecting to the spleen,  kidney or adrenal gland.  It appears to be separate from the  bowel. This is most consistent with a fetal ovarian cyst. The  amniotic fluid is normal.  She had normal genetic screening  with cell free DNA. I reassured the patient that this is unlikely  to affect her pregnancy but it should be monitored.  The most common cystic abdominal masses are functional  ovarian cysts in female fetuses-approximately 30% are born  with small follicular cysts. Sonographically apparent  functional ovarian cysts (>2 cm in diameter) have an  incidence of approximately 4:10,000 pregnancies and are  seen in the second half of pregnancy in response to maternal  and placental hormones. Other abnormal intraabdominal  cystic masses are less common. Fetal ovarian cysts result  from folliculogenesis, stimulated by elevated placental human  chorionic gonadotropin  (hCG) levels, particularly in  pregnancies with large placental mass. Cystic abdominal  lesions occur secondary to faulty canalization of gut lumen or  deformation of otherwise normal fluid-containing structures.  The size, number, and location of the cysts, along with  the  fetal sex, helps determine the presumptive etiology. However,  final  diagnosis may not be known in 25%-50% of cases  pending postnatal investigation. Cysts may have mixed  echogenic patterns, which represent internal septations,  hemorrhage, or calcifications. Color flow Doppler imaging  may be necessary to distinguish cysts from vascular  structures (e.g., umbilical varix, portal vein) and to assess for  torsion. Fetal MRI may be a useful adjunct to identify the  source of the cyst when the diagnosis is unclear on  ultrasound. Sensitivity and specificity of prenatal ultrasound  to identify the system of origin of abdominal cysts are  reported to be 88% and 96%, respectively. Accurate prenatal  identification may be unnecessary, unless fetal intervention  (e.g., termination, fetal surgery) is planned. Abnormal cystic  abdominal masses include a cyst developing on either the  gastrointestinal system, hepatobiliary system, renal system or  other organs such as the spleen, pancreas, urachus,  mesentery, omentum and umbilical vein. Intrabdominal  masses can be associated with other congenital  malformations and genetic syndromes, although cystic  abdominal lesions are often isolated; only one-third have  additional structural defects. Polyhydramnios is common,  caused by small bowel obstruction secondary to compression  by a large cyst, and can increase the risk of preterm delivery.  Ascites may be present secondary to ruptured cyst or  transudation. Hydronephrosis and/or ureteromegaly can be  seen with massively enlarged uterus, with hydrometrocolpos  compressing the ureters. Meconium pseudocyst can be  associated with cystic fibrosis.  Approximately 25% of prenatal ovarian cysts will resolve prior  to delivery and nearly 50% resolve postdelivery after the  maternal hormones dissipate.  If discovered in the first  trimester nearly 80% of isolated cystic abdominal masses  dissipate during the pregnancy.  Resolution of the cyst is less  likely if it is complex or measuring greater than 40  mm in  diameter.  Ovarian torsion is also more likely with complex  masses and diameter greater than 40 mm.  Approximately  one third of liveborn fetuses with a persistent abdominal cyst  will require surgical intervention.  The long-term prognosis  depends upon the exact type of cystic abdominal lesion and  organs involved but is generally good for isolated ovarian  cysts.  Perinatal mortality for all intra-abdominal cystic lesions  is approximately 5%.  Antenatal management consists of aneuploidy screening if  not previously completed, consideration of amniocentesis,  detailed ultrasound to rule out other anatomic structures and  serial growth ultrasounds and antenatal testing starting  around 32 to 34 weeks to assess fetal wellbeing and monitor  the growth of the mass.  Large ovarian cysts are at greater  risk for torsion, rupture and hemorrhage.  Risk of torsion  (35%-45%) and need for postnatal surgery (60%-80%)  highest when fetal cyst measures 5-8 cm; cysts larger than 8  cm are less likely to undergo torsion (18%). New fluid levels  or mixed echogenicity within ovarian cyst may represent  torsion. Echogenic areas within a cyst may represent  hemorrhage or clot formation. Cyst aspiration prior to delivery  is controversial, as this may cause intracystic bleeding,  infection, theoretic seeding of malignancy, and preterm labor.  Consider fetal ovarian cyst aspiration if ?5 cm or if cyst  diameter increases by ?1 cm/week to reduce risk of  postpartum oophorectomy (14% versus 85%; P = .0002).  Prenatal ovarian cyst aspiration can result in intracystic  hemorrhage (12.8%; 95% CI, 3.8%-26.0%) and torsion still  possible (10.8%; 95%  CI, 4.4%-19.7%); 37% will recur during  the pregnancy (95% CI, 14.8%-64.3%); and almost 22%  require postnatal surgery (95% CI, 0.9%-40.0%). Fetal  nonstress and/or biophysical profile testing twice weekly  beginning at 32-34 weeks. Prenatal neonatology and  pediatric surgery  consultation to discuss postnatal  management and prognosis. Delivery in tertiary care facility is  recommended. Cesarean delivery should be considered for  large (>5 cm) cystic abdominal lesions to prevent rupture and  soft tissue dystocia.  Sonographic findings  Single intrauterine pregnancy.  Fetal cardiac activity: Observed.  Presentation: Cephalic.  Interval fetal anatomy appears normal except for a suspected  fetal ovarian cyst measuring 4.98 x 4.64 x 4.06 cm.  Fetal biometry shows the estimated fetal weight at the 41  percentile.  Amniotic fluid: Within normal limits.  MVP: 5.48 cm.  Placenta: Posterior.  BPP: 8/8.  There are limitations of prenatal ultrasound such as the  inability to detect certain abnormalities due to poor  visualization. Various factors such as fetal position,  gestational age and maternal body habitus may increase the  difficulty in visualizing the fetal anatomy.  Recommendations  -Weekly antenatal testing until delivery  -Will add to [facility] list  -Vaginal delivery preferred unless there is rapid cyst  enlargement  -Ok to delivery at [facility] unless there is rapid cyst enlargement  -Post natal imaging of the fetal ovarian cyst ----------------------------------------------------------------------                  Penney Bowling, DO Electronically Signed Final Report   09/29/2023 01:56 pm ----------------------------------------------------------------------   US  MFM OB FOLLOW UP Result Date: 09/29/2023 ----------------------------------------------------------------------  OBSTETRICS REPORT                       (Signed Final 09/29/2023 01:56 pm) ---------------------------------------------------------------------- Patient Info  ID #:       [id number]                          D.O.B.:  Aug 12, 1990 (32 yrs)(F)  Name:       Wray Heady Klute                 Visit Date: 09/29/2023 09:37 am ---------------------------------------------------------------------- Performed By  Attending:        Penney Bowling DO       Ref. Address:     [address]                                                             [place], [place]                                                             [address]  Performed By:     Elspeth Hals       [facility]  Referred By:      [facility] ---------------------------------------------------------------------- Orders  #  Description                           Code        Ordered By  1  US  MFM FETAL BPP WO NON               76819.01    YU FANG     STRESS  2  US  MFM OB FOLLOW UP                   24401.02    YU FANG ----------------------------------------------------------------------  #  Order #                     Accession #                Episode #  1  [id number]                   [id number]                 [id number]  2  [id number]                   [id number]                 [id number] ---------------------------------------------------------------------- Indications  Obesity complicating pregnancy, third          O99.213  trimester (BMI 47)  Fetal abnormality - other known or             O35.8XX0  suspected (Suspected fetal ovarian cyst)  Medical complication of pregnancy (History     O26.90  of Hypertension)  Encounter for other antenatal screening        Z36.2  follow-up  [redacted] weeks gestation of pregnancy                Z3A.34  LR female, neg horizon, neg AFP, 2hr GTT  WNL ---------------------------------------------------------------------- Vital Signs  BP:          112/74 ---------------------------------------------------------------------- Fetal Evaluation  Num Of Fetuses:         1  Fetal Heart Rate(bpm):  158  Cardiac  Activity:       Observed  Presentation:            Cephalic  Placenta:               Posterior  P. Cord Insertion:      Previously seen  Amniotic Fluid  AFI FV:      Within normal limits  AFI Sum(cm)     %Tile       Largest Pocket(cm)  14.87           53          5.48  RUQ(cm)       RLQ(cm)       LUQ(cm)        LLQ(cm)  3.33          1.48          5.48           4.58 ---------------------------------------------------------------------- Biophysical Evaluation  Amniotic F.V:   Pocket => 2 cm             F. Tone:        Observed  F. Movement:    Observed                   Score:          8/8  F. Breathing:   Observed ---------------------------------------------------------------------- Biometry  BPD:      82.6  mm     G. Age:  33w 2d         17  %    CI:        74.95   %    70 - 86                                                          FL/HC:      21.4   %    19.4 - 21.8  HC:      302.7  mm     G. Age:  33w 4d          6  %    HC/AC:      0.97        0.96 - 1.11  AC:      311.3  mm     G. Age:  35w 0d         73  %    FL/BPD:     78.6   %    71 - 87  FL:       64.9  mm     G. Age:  33w 3d         18  %    FL/AC:      20.8   %    20 - 24  Est. FW:    2396  gm      5 lb 5 oz     41  % ---------------------------------------------------------------------- OB History  Gravidity:    7         Term:   3         SAB:   1  TOP:          2        Living:  3 ---------------------------------------------------------------------- Gestational Age  LMP:  34w 3d        Date:  01/31/23                  EDD:   11/07/23  U/S Today:     33w 6d                                        EDD:   11/11/23  Best:          34w 3d     Det. By:  LMP  (01/31/23)          EDD:   11/07/23 ---------------------------------------------------------------------- Anatomy  Cranium:               Appears normal         Stomach:                Appears normal, left                                                                        sided  Thoracic:              Appears normal         Abdomen:                 Ovarian cyst  Heart:                 Appears normal         Kidneys:                Appear normal                         (4CH, axis, and                         situs)  Diaphragm:             Appears normal         Bladder:                Appears normal  Other:  Cystic structure in abdomen measures 5 x 4.6 x 4.1cm ---------------------------------------------------------------------- Comments  Tiphani M Ballowe is doing well today with no acute concerns.  There is a cystic abdominal lesion seen within the fetal  abdomen of a female fetus likely to represent an ovarian cyst.  The mass is located on the left side of the abdomen above  the bladder and distinct from the kidney, spleen, bowel or  adrenal gland. It anechoic in appearance and measures  4.98  x 4.64 x 4.06 cm. The cyst is simple without evidence of  intraechoic debris or layering suggestive of a hemorrhagic  ovarian cyst or neoplasia. There is a thin single septation.  There is no obvious blood supply within or to the mass.  There is no evidence of the mass connecting to the spleen,  kidney or adrenal gland.  It appears to be separate from the  bowel. This is most consistent with a fetal ovarian cyst. The  amniotic fluid is normal.  She had normal  genetic screening  with cell free DNA. I reassured the patient that this is unlikely  to affect her pregnancy but it should be monitored.  The most common cystic abdominal masses are functional  ovarian cysts in female fetuses-approximately 30% are born  with small follicular cysts. Sonographically apparent  functional ovarian cysts (>2 cm in diameter) have an  incidence of approximately 4:10,000 pregnancies and are  seen in the second half of pregnancy in response to maternal  and placental hormones. Other abnormal intraabdominal  cystic masses are less common. Fetal ovarian cysts result  from folliculogenesis, stimulated by elevated placental human  chorionic gonadotropin  (hCG) levels, particularly in   pregnancies with large placental mass. Cystic abdominal  lesions occur secondary to faulty canalization of gut lumen or  deformation of otherwise normal fluid-containing structures.  The size, number, and location of the cysts, along with the  fetal sex, helps determine the presumptive etiology. However,  final diagnosis may not be known in 25%-50% of cases  pending postnatal investigation. Cysts may have mixed  echogenic patterns, which represent internal septations,  hemorrhage, or calcifications. Color flow Doppler imaging  may be necessary to distinguish cysts from vascular  structures (e.g., umbilical varix, portal vein) and to assess for  torsion. Fetal MRI may be a useful adjunct to identify the  source of the cyst when the diagnosis is unclear on  ultrasound. Sensitivity and specificity of prenatal ultrasound  to identify the system of origin of abdominal cysts are  reported to be 88% and 96%, respectively. Accurate prenatal  identification may be unnecessary, unless fetal intervention  (e.g., termination, fetal surgery) is planned. Abnormal cystic  abdominal masses include a cyst developing on either the  gastrointestinal system, hepatobiliary system, renal system or  other organs such as the spleen, pancreas, urachus,  mesentery, omentum and umbilical vein. Intrabdominal  masses can be associated with other congenital  malformations and genetic syndromes, although cystic  abdominal lesions are often isolated; only one-third have  additional structural defects. Polyhydramnios is common,  caused by small bowel obstruction secondary to compression  by a large cyst, and can increase the risk of preterm delivery.  Ascites may be present secondary to ruptured cyst or  transudation. Hydronephrosis and/or ureteromegaly can be  seen with massively enlarged uterus, with hydrometrocolpos  compressing the ureters. Meconium pseudocyst can be  associated with cystic fibrosis.  Approximately 25% of prenatal ovarian cysts  will resolve prior  to delivery and nearly 50% resolve postdelivery after the  maternal hormones dissipate.  If discovered in the first  trimester nearly 80% of isolated cystic abdominal masses  dissipate during the pregnancy.  Resolution of the cyst is less  likely if it is complex or measuring greater than 40 mm in  diameter.  Ovarian torsion is also more likely with complex  masses and diameter greater than 40 mm.  Approximately  one third of liveborn fetuses with a persistent abdominal cyst  will require surgical intervention.  The long-term prognosis  depends upon the exact type of cystic abdominal lesion and  organs involved but is generally good for isolated ovarian  cysts.  Perinatal mortality for all intra-abdominal cystic lesions  is approximately 5%.  Antenatal management consists of aneuploidy screening if  not previously completed, consideration of amniocentesis,  detailed ultrasound to rule out other anatomic structures and  serial growth ultrasounds and antenatal testing starting  around 32 to 34 weeks to assess fetal wellbeing and monitor  the growth of the mass.  Large ovarian cysts are at greater  risk for torsion, rupture and hemorrhage.  Risk of torsion  (35%-45%) and need for postnatal surgery (60%-80%)  highest when fetal cyst measures 5-8 cm; cysts larger than 8  cm are less likely to undergo torsion (18%). New fluid levels  or mixed echogenicity within ovarian cyst may represent  torsion. Echogenic areas within a cyst may represent  hemorrhage or clot formation. Cyst aspiration prior to delivery  is controversial, as this may cause intracystic bleeding,  infection, theoretic seeding of malignancy, and preterm labor.  Consider fetal ovarian cyst aspiration if ?5 cm or if cyst  diameter increases by ?1 cm/week to reduce risk of  postpartum oophorectomy (14% versus 85%; P = .0002).  Prenatal ovarian cyst aspiration can result in intracystic  hemorrhage (12.8%; 95% CI, 3.8%-26.0%) and torsion  still  possible (10.8%; 95% CI, 4.4%-19.7%); 37% will recur during  the pregnancy (95% CI, 14.8%-64.3%); and almost 22%  require postnatal surgery (95% CI, 0.9%-40.0%). Fetal  nonstress and/or biophysical profile testing twice weekly  beginning at 32-34 weeks. Prenatal neonatology and  pediatric surgery consultation to discuss postnatal  management and prognosis. Delivery in tertiary care facility is  recommended. Cesarean delivery should be considered for  large (>5 cm) cystic abdominal lesions to prevent rupture and  soft tissue dystocia.  Sonographic findings  Single intrauterine pregnancy.  Fetal cardiac activity: Observed.  Presentation: Cephalic.  Interval fetal anatomy appears normal except for a suspected  fetal ovarian cyst measuring 4.98 x 4.64 x 4.06 cm.  Fetal biometry shows the estimated fetal weight at the 41  percentile.  Amniotic fluid: Within normal limits.  MVP: 5.48 cm.  Placenta: Posterior.  BPP: 8/8.  There are limitations of prenatal ultrasound such as the  inability to detect certain abnormalities due to poor  visualization. Various factors such as fetal position,  gestational age and maternal body habitus may increase the  difficulty in visualizing the fetal anatomy.  Recommendations  -Weekly antenatal testing until delivery  -Will add to [facility] list  -Vaginal delivery preferred unless there is rapid cyst  enlargement  -Ok to delivery at [facility] unless there is rapid cyst enlargement  -Post natal imaging of the fetal ovarian cyst ----------------------------------------------------------------------                  Penney Bowling, DO Electronically Signed Final Report   09/29/2023 01:56 pm ----------------------------------------------------------------------     Assessment : SEINI LANNOM is a 33 y.o. Z6X0960 at [redacted]w[redacted]d being admitted for [facility] labor.  Plan: Labor: Expectant management for now. Can do augmentation as per protocol as needed. Analgesia as needed. FWB: Category 1 fetal  heart tracing.  GBS negative. Fetal ovarian cyst measuring 4.98 x 4.64 x 4.06 cm.  Cleared for vaginal delivery here at [facility] by MFM/MAAC.  Needs postnatal ultrasound.  Analgesia: Desires epidural  MOF: Plans to breastfeed MOC: Patient desires BTL, discussed that the decision to do this will depend on  the attending on call, especially given her elevated BMI (if done as a postpartum procedure).  Delivery plan: Hopeful for vaginal delivery.   Lenoard Rad, MD, FACOG Obstetrician & Gynecologist, [facility] for [employer], [facility]

## 2023-10-22 NOTE — Anesthesia Procedure Notes (Signed)
 Epidural Patient location during procedure: OB Start time: 10/22/2023 1:21 AM End time: 10/22/2023 1:30 AM  Staffing Anesthesiologist: Leslye Rast, MD Performed: anesthesiologist   Preanesthetic Checklist Completed: patient identified, IV checked, site marked, risks and benefits discussed, surgical consent, monitors and equipment checked, pre-op evaluation and timeout performed  Epidural Patient position: sitting Prep: DuraPrep Patient monitoring: heart rate, continuous pulse ox and blood pressure Approach: midline Location: L4-L5 Injection technique: LOR saline  Needle:  Needle type: Tuohy  Needle gauge: 17 G Needle length: 9 cm Needle insertion depth: 9 cm Catheter type: closed end flexible Catheter size: 19 Gauge Catheter at skin depth: 14 cm Test dose: negative and 1.5% lidocaine  with Epi 1:200 K  Assessment Events: blood not aspirated, no cerebrospinal fluid, injection not painful, no injection resistance, no paresthesia and negative IV test  Additional Notes Reason for block:procedure for pain

## 2023-10-23 LAB — BIRTH TISSUE RECOVERY COLLECTION (PLACENTA DONATION)

## 2023-10-23 NOTE — Anesthesia Postprocedure Evaluation (Signed)
 Anesthesia Post Note  Patient: Annette Wood  Procedure(s) Performed: AN AD HOC LABOR EPIDURAL     Patient location during evaluation: Mother Baby Anesthesia Type: Epidural Level of consciousness: awake and alert Pain management: pain level controlled Vital Signs Assessment: post-procedure vital signs reviewed and stable Respiratory status: spontaneous breathing, nonlabored ventilation and respiratory function stable Cardiovascular status: stable Postop Assessment: no headache, no backache and epidural receding Anesthetic complications: no   No notable events documented.  Last Vitals:  Vitals:   10/22/23 2201 10/23/23 0520  BP: 124/88 104/64  Pulse: 86 78  Resp: 18 17  Temp:  36.7 C  SpO2: 100% 98%    Last Pain:  Vitals:   10/23/23 0520  TempSrc: Oral  PainSc: 0-No pain   Pain Goal:                   [name]

## 2023-10-23 NOTE — Lactation Note (Signed)
 This note was copied from a baby's chart. Lactation Consultation Note  Patient Name: Annette Wood ZOXWR'U Date: 10/23/2023 Age:32 hours  Reason for consult: Follow-up assessment;Early term 37-38.6wks, <6 lbs  P4, [redacted]w[redacted]d, 3% weight loss  Follow up LC visit. Mother reports she is latching baby but does not feel she is "getting anything at the breast". Mother is formula feeding after breastfeeding. Reports breastfeeding occasionally. Mother has not pump since the DEBP was set up yesterday.   Discussed the process of milk production, "supply and demand" and the importance of breast stimulation and milk removal in order to make an optimal milk supply.  Discussed mother to breastfeed 8-12 times in 24 hours, skin to skin and breast feed before formula feeding.   If missed feedings at breast or substituting feeding with formula, advised to hand express and/or pump to remove milk from the breast.     Maternal Data Has patient been taught Hand Expression?: Yes Does the patient have breastfeeding experience prior to this delivery?: No  Feeding Mother's Current Feeding Choice: Breast Milk and Formula   Interventions Interventions: Education  Discharge Pump: Personal  Consult Status Consult Status: Follow-up Date: 10/24/23 Follow-up type: In-patient    Annette Wood 10/23/2023, 7:23 PM

## 2023-10-23 NOTE — Progress Notes (Signed)
 POSTPARTUM PROGRESS NOTE  Subjective: Annette Wood is a 33 y.o. Z6X0960 s/p SVB at [redacted]w[redacted]d.  She reports she doing well. No acute events overnight. She denies any problems with ambulating, voiding or po intake. Denies nausea or vomiting. She has passed flatus. Pain is well controlled.  Lochia is appropriate.  Objective: Blood pressure 104/64, pulse 78, temperature 98 F (36.7 C), temperature source Oral, resp. rate 17, height 5\' 3"  (1.6 m), weight 118.2 kg, last menstrual period 01/31/2023, SpO2 98%, unknown if currently breastfeeding.  Physical Exam:  General: alert, cooperative and no distress Chest: no respiratory distress Abdomen: soft, non-tender  Uterine Fundus: firm, appropriately tender Extremities: No calf swelling or tenderness  +1 edema  Recent Labs    10/22/23 0031  HGB 11.8*  HCT 34.8*    Assessment/Plan: ARIANI SEIER is a 33 y.o. [id number] s/p SVB at [redacted]w[redacted]d.  Routine Postpartum Care: Doing well, pain well-controlled.  -- Continue routine care, lactation support  -- Contraception: interval BTL -- Feeding: breast  Dispo: Plan for discharge 10/24/23.  Raford Bunk, MSN, CNM, RNC-OB Certified Nurse Midwife, [facility] 10/23/2023 10:54 AM

## 2023-10-24 MED ORDER — OXYCODONE-ACETAMINOPHEN 5-325 MG PO TABS: 1.0000 | ORAL_TABLET | Freq: Four times a day (QID) | ORAL | 0 refills | Status: DC | PRN

## 2023-10-24 MED ORDER — IBUPROFEN 600 MG PO TABS: 600.0000 mg | ORAL_TABLET | Freq: Four times a day (QID) | ORAL | 0 refills | Status: DC

## 2023-10-24 NOTE — Lactation Note (Signed)
 This note was copied from a baby's chart. Lactation Consultation Note  Patient Name: Annette Wood ZOXWR'U Date: 10/24/2023 Age:32 hours Reason for consult: Follow-up assessment;Infant weight loss;Early term 37-38.6wks;1st time breastfeeding (now only  2 % weight loss today. gained weight) Baby has received bottles all night up to 60 ml last feeding,  LC recommended when she goes home to continue attempting to latch, try 1st feeding the baby and appetizer of EBM or formula 10 ml and then latch. Is baby is still hungry after the 1st breast offer the 2nd breast, if satisfied hold off on the formula, feed 30 ml of EBM or formula,  Post pump both breast for 15 mins and save the milk.  LC reviewed the Breast feeding D/C teaching, storage guidelines and LC resources.   Maternal Data Has patient been taught Hand Expression?: Yes  Feeding Mother's Current Feeding Choice: Breast Milk and Formula Nipple Type: Slow - flow  LATCH Score - LC unable to assess latch due to baby having 60 ml at 9 am      Interventions Interventions: Breast feeding basics reviewed;LC Services brochure;CDC milk storage guidelines;CDC Guidelines for Breast Pump Cleaning  Discharge Discharge Education: Engorgement and breast care;Other (comment) (per mom has a LC O/P 6/22) Pump: Personal (per mom has a pump at home)  Consult Status Consult Status: Complete Date: 10/24/23    Richarda Chance 10/24/2023, 10:36 AM

## 2023-10-31 ENCOUNTER — Inpatient Hospital Stay (HOSPITAL_COMMUNITY): Admission: RE | Admit: 2023-10-31 | Source: Home / Self Care | Admitting: Obstetrics and Gynecology

## 2023-10-31 ENCOUNTER — Inpatient Hospital Stay (HOSPITAL_COMMUNITY)

## 2023-11-05 ENCOUNTER — Telehealth (HOSPITAL_COMMUNITY): Payer: Self-pay | Admitting: *Deleted

## 2023-11-05 NOTE — Telephone Encounter (Signed)
 11/05/2023  Name: KYRIN GARN MRN: [id number] DOB: 02/10/1991  Reason for Call:  Transition of Care Hospital Discharge Call  Contact Status: Patient Contact Status: Complete  Language assistant needed: Interpreter Mode: Interpreter Not Needed        Follow-Up Questions: Do You Have Any Concerns About Your Health As You Heal From Delivery?: No Do You Have Any Concerns About Your Infants Health?: No  Edinburgh Postnatal Depression Scale:  In the Past 7 Days:    PHQ2-9 Depression Scale:     Discharge Follow-up: Edinburgh score requires follow up?:  (declines screening today, says answers are the same as in the hospital when score was 0, endorses she is doing well emotionally) Patient was advised of the following resources:: Support Group, Breastfeeding Support Group  Post-discharge interventions: Reviewed Newborn Safe Sleep Practices  Pearlie Bougie, RN 11/05/2023 10:22

## 2023-11-17 ENCOUNTER — Ambulatory Visit: Admitting: Obstetrics and Gynecology

## 2023-11-17 DIAGNOSIS — Z3009 Encounter for other general counseling and advice on contraception: Secondary | ICD-10-CM | POA: Diagnosis not present

## 2023-11-17 NOTE — Progress Notes (Signed)
 Post Partum Visit Note  Annette Wood is a 33 y.o. [id number] female who presents for a postpartum visit. She is 4 weeks postpartum following a normal spontaneous vaginal delivery.  I have fully reviewed the prenatal and intrapartum course. The delivery was at 37.5 gestational weeks.  Anesthesia: epidural. Postpartum course has been uncomplicated. Baby is doing well. Baby is feeding by both breast and bottle - Similac Advance. Bleeding no bleeding. Bowel function is normal. Bladder function is normal. Patient is not sexually active. Contraception method is abstinence. Pt would like to discuss and schedule BTL.  Postpartum depression screening: negative, score 0.   The pregnancy intention screening data noted above was reviewed. Potential methods of contraception were discussed. The patient elected to proceed with Female Sterilization.   Edinburgh Postnatal Depression Scale - 11/17/23 0954       Edinburgh Postnatal Depression Scale:  In the Past 7 Days   I have been able to laugh and see the funny side of things. 0    I have looked forward with enjoyment to things. 0    I have blamed myself unnecessarily when things went wrong. 0    I have been anxious or worried for no good reason. 0    I have felt scared or panicky for no good reason. 0    Things have been getting on top of me. 0    I have been so unhappy that I have had difficulty sleeping. 0    I have felt sad or miserable. 0    I have been so unhappy that I have been crying. 0    The thought of harming myself has occurred to me. 0    Edinburgh Postnatal Depression Scale Total 0          Health Maintenance Due  Topic Date Due   Hepatitis B Vaccines (1 of 3 - 19+ 3-dose series) Never done   COVID-19 Vaccine (1 - 2024-25 season) Never done   The following portions of the patient's history were reviewed and updated as appropriate: allergies, current medications, past family history, past medical history, past social history,  past surgical history, and problem list.  Review of Systems Pertinent items are noted in HPI.  Objective:  BP 124/87   Pulse 77   Wt 246 lb (111.6 kg)   LMP 01/31/2023   Breastfeeding Yes   BMI 43.58 kg/m    General:  alert, cooperative, and no distress   Breasts:  not indicated  Lungs: Normal effort  Heart:  Normal rate  Abdomen: Soft, non tender. Laparoscopic scars from prior chole   Wound N/a  GU exam:  N/a       Assessment:   Postpartum exam  Sterilization consult - She desires permanent sterilization. Discussed alternatives including LARC options and vasectomy. She declines these options.  - Discussed surgery of salpingectomy for lower contraceptive failure rate and potential ovarian cancer risk reduction - Risks of surgery include but are not limited to: bleeding, infection, injury to surrounding organs/tissues (i.e. bowel/bladder/ureters), need for additional procedures, wound complications, hospital re-admission, and conversion to open surgery, VTE. We reviewed risk of contraceptive failure and risk of regret.  - Reviewed restrictions and recovery following surgery - Discussed modest weight loss prior to surgery can also help facilitate a safe procedure - She requests surgery is scheduled AFTER Sept 19th if it can't be scheduled in August -     Ambulatory Referral For Surgery Scheduling  Plan:   Essential  components of care per ACOG recommendations:  1.  Mood and well being: Patient with negative depression screening today. Reviewed local resources for support.  - Patient tobacco use? No.   - hx of drug use? No.    2. Infant care and feeding:  -Patient currently breastmilk feeding? Yes. Reviewed importance of draining breast regularly to support lactation.  -Social determinants of health (SDOH) reviewed in EPIC. No concerns  3. Sexuality, contraception and birth spacing - Patient does not want a pregnancy in the next year.  Desired family size is 4 children.   - Reviewed reproductive life planning. Reviewed contraceptive methods based on pt preferences and effectiveness.  Patient desired Female Sterilization today.   - Discussed birth spacing of 18 months  4. Sleep and fatigue -Encouraged family/partner/community support of 4 hrs of uninterrupted sleep to help with mood and fatigue  5. Physical Recovery  - Discussed patients delivery and complications. She describes her labor as good. - Patient had a Vaginal, no problems at delivery. Patient had no laceration. Perineal healing reviewed. Patient expressed understanding - Patient has urinary incontinence? No. - Patient is safe to resume physical and sexual activity  6.  Health Maintenance - HM due items addressed Yes - Last pap smear  Diagnosis  Date Value Ref Range Status  08/28/2022   Final   - Negative for intraepithelial lesion or malignancy (NILM)   Pap smear not done at today's visit.  -Breast Cancer screening indicated? No.   7. Chronic Disease/Pregnancy Condition follow up: None  Kieth JAYSON Carolin, MD Center for [employer], [facility]

## 2024-02-05 ENCOUNTER — Telehealth: Payer: Self-pay | Admitting: *Deleted

## 2024-02-05 DIAGNOSIS — E559 Vitamin D deficiency, unspecified: Secondary | ICD-10-CM

## 2024-02-05 NOTE — Progress Notes (Signed)
 Complex Care Management Note Care Guide Note  02/05/2024 Name: Annette Wood MRN: [id number] DOB: 04/02/1991   Complex Care Management Outreach Attempts: An unsuccessful telephone outreach was attempted today to offer the patient information about available complex care management services.  Follow Up Plan:  Additional outreach attempts will be made to offer the patient complex care management information and services.   Encounter Outcome:  No Answer  Harlene Satterfield  [facility]  [facility], [facility] Guide  Direct Dial : [phone]  Fax [phone]

## 2024-02-09 NOTE — Progress Notes (Signed)
 Complex Care Management Note  Care Guide Note 02/09/2024 Name: Annette Wood MRN: [id number] DOB: [birth year]  Annette Wood is a 33 y.o. year old female who sees Patient, No Pcp Per for primary care. I reached out to Terrie CHRISTELLA Moats by phone today to offer complex care management services.  Ms. Matura was given information about Complex Care Management services today including:   The Complex Care Management services include support from the care team which includes your Nurse Care Manager, Clinical Social Worker, or Pharmacist.  The Complex Care Management team is here to help remove barriers to the health concerns and goals most important to you. Complex Care Management services are voluntary, and the patient may decline or stop services at any time by request to their care team member.   Complex Care Management Consent Status: Patient agreed to services and verbal consent obtained.   Follow up plan:  Telephone appointment with complex care management team member scheduled for:  02/13/24  Encounter Outcome:  Patient Scheduled Harlene Satterfield  [facility]  [facility], [place] [facility] Guide  Direct Dial : [phone]  Fax [phone]

## 2024-02-12 ENCOUNTER — Other Ambulatory Visit: Payer: Self-pay

## 2024-02-12 NOTE — Patient Outreach (Signed)
 Complex Care Management   Visit Note  02/12/2024  Name:  Annette Wood MRN: [id number] DOB: [birth year]  Situation: Referral received for Complex Care Management related to connecting patient with PCP I obtained verbal consent from Patient.  Visit completed with Patient  on the phone  Background:   Past Medical History:  Diagnosis Date   Chlamydia    Gallstones    Gastroesophageal reflux    Gonorrhea    Panic attack     Assessment:  BSW outreached patient today to assess for SDOH barriers. During the call, BSW determined that there were no barriers present at this time. The initial referral was to connect the patient with a PCP or provide them with a list of providers. BSW compiled a list of [facility] PCP providers and has emailed and mailed the list to the patient.  PCP [facility] Offices Provided:  [facility] [address], [place], [place] [address] Phone: [phone]  [facility] [address], [place], [place] [address] Phone: [phone]  [facility] at [facility] [address], [place], [place] [address] Phone: [phone]  [facility] at [facility] [address] [place], [place] [address] Phone: [phone]  BSW informed patient to reach out if further assistance is needed. Case will be closed at this time.    Recommendation:   No recommendations at this time.  Follow Up Plan:   Patient has met all care management goals. Care Management case will be closed. Patient has been provided contact information should new needs arise.   Orlean Fey, BSW [place]  [facility] Social Worker, [employer] [facility] [phone]

## 2024-02-12 NOTE — Patient Instructions (Signed)
 Visit Information  Thank you for taking time to visit with me today. Please don't hesitate to contact me if I can be of assistance to you before our next scheduled appointment.  Our next appointment is no further scheduled appointments.   Please call the care guide team at [phone] if you need to cancel or reschedule your appointment.   Please call the Suicide and Crisis Lifeline: 988 call the [place]  National Suicide Prevention Lifeline: 920-258-5540 or TTY: 8651411891 TTY (802) 001-7399) to talk to a trained counselor call 1-800-273-TALK (toll free, 24 hour hotline) go to [facility] [address] [place] [phone]) call 911 if you are experiencing a Mental Health or Behavioral Health Crisis or need someone to talk to.  Patient verbalizes understanding of instructions and care plan provided today and agrees to view in MyChart. Active MyChart status and patient understanding of how to access instructions and care plan via MyChart confirmed with patient.     Orlean Fey, BSW [place]  [facility] Social Worker, [facility] [phone]

## 2024-02-19 ENCOUNTER — Telehealth: Payer: Self-pay

## 2024-02-19 NOTE — Telephone Encounter (Signed)
 I called the patient to see if she's available for surgery with Dr. Erik on 03/16/24 at 2 pm. Patient agreed to surgery date. Pre-Op instructions and surgery details were provided by phone.

## 2024-03-02 ENCOUNTER — Other Ambulatory Visit: Payer: Self-pay | Admitting: Obstetrics and Gynecology

## 2024-03-02 DIAGNOSIS — Z01818 Encounter for other preprocedural examination: Secondary | ICD-10-CM

## 2024-03-02 DIAGNOSIS — Z302 Encounter for sterilization: Secondary | ICD-10-CM

## 2024-03-02 NOTE — Progress Notes (Signed)
OR orders entered 

## 2024-03-09 ENCOUNTER — Encounter (HOSPITAL_COMMUNITY): Payer: Self-pay

## 2024-03-10 ENCOUNTER — Encounter (HOSPITAL_COMMUNITY): Payer: Self-pay | Admitting: Obstetrics and Gynecology

## 2024-03-10 NOTE — Progress Notes (Signed)
 Spoke w/ via phone for pre-op interview--- pt Lab needs dos----     upt    Lab results------no COVID test -----patient states asymptomatic no test needed Arrive at ------- 1200  on 03-16-2024 NPO after MN NO Solid Food.  Clear liquids from MN until--- 110 Pre-Surgery Ensure or G2:  n/a  Med rec completed Medications to take morning of surgery ----- none Diabetic medication -----  n/a  GLP1 agonist last dose: n/a GLP1 instructions:  Patient instructed no nail polish to be worn day of surgery Patient instructed to bring photo id and insurance card day of surgery Patient aware to have Driver (ride ) / caregiver    for 24 hours after surgery - mother,latisha Almendarez Patient Special Instructions ----- n/a Pre-Op special Instructions ----- n/a  Patient verbalized understanding of instructions that were given at this phone interview. Patient denies chest pain, sob, fever, cough at the interview.

## 2024-03-15 ENCOUNTER — Other Ambulatory Visit: Payer: Self-pay | Admitting: Advanced Practice Midwife

## 2024-03-16 ENCOUNTER — Other Ambulatory Visit: Payer: Self-pay

## 2024-03-16 ENCOUNTER — Encounter (HOSPITAL_COMMUNITY): Payer: Self-pay | Admitting: Obstetrics and Gynecology

## 2024-03-16 ENCOUNTER — Ambulatory Visit (HOSPITAL_COMMUNITY)
Admission: RE | Admit: 2024-03-16 | Discharge: 2024-03-16 | Disposition: A | Discharge status: Home / Self Care | Attending: Obstetrics and Gynecology | Admitting: Obstetrics and Gynecology

## 2024-03-16 ENCOUNTER — Ambulatory Visit (HOSPITAL_COMMUNITY)

## 2024-03-16 ENCOUNTER — Encounter (HOSPITAL_COMMUNITY)
Admission: RE | Disposition: A | Discharge status: Home / Self Care | Payer: Self-pay | Source: Home / Self Care | Attending: Obstetrics and Gynecology

## 2024-03-16 DIAGNOSIS — Z01818 Encounter for other preprocedural examination: Secondary | ICD-10-CM

## 2024-03-16 DIAGNOSIS — Z302 Encounter for sterilization: Secondary | ICD-10-CM | POA: Insufficient documentation

## 2024-03-16 HISTORY — DX: Personal history of other infectious and parasitic diseases: Z86.19

## 2024-03-16 HISTORY — PX: LAPAROSCOPIC BILATERAL SALPINGECTOMY: SHX5889

## 2024-03-16 HISTORY — DX: Personal history of other mental and behavioral disorders: Z86.59

## 2024-03-16 LAB — POCT PREGNANCY, URINE: Preg Test, Ur: NEGATIVE

## 2024-03-16 SURGERY — SALPINGECTOMY, BILATERAL, LAPAROSCOPIC
Anesthesia: General | Site: Abdomen | Laterality: Bilateral

## 2024-03-16 MED ORDER — FENTANYL CITRATE (PF) 100 MCG/2ML IJ SOLN
25.0000 ug | INTRAMUSCULAR | Status: DC | PRN
  Administered 2024-03-16: 25 ug via INTRAVENOUS
  Administered 2024-03-16: 50 ug via INTRAVENOUS

## 2024-03-16 MED ORDER — ACETAMINOPHEN 10 MG/ML IV SOLN: 1000.0000 mg | Freq: Once | INTRAVENOUS | Status: DC | PRN

## 2024-03-16 MED ORDER — LIDOCAINE 2% (20 MG/ML) 5 ML SYRINGE
INTRAMUSCULAR | Status: AC
  Filled 2024-03-16: qty 5

## 2024-03-16 MED ORDER — FENTANYL CITRATE (PF) 100 MCG/2ML IJ SOLN
INTRAMUSCULAR | Status: AC
  Filled 2024-03-16: qty 2

## 2024-03-16 MED ORDER — OXYCODONE HCL 5 MG PO TABS
ORAL_TABLET | ORAL | Status: AC
  Filled 2024-03-16: qty 1

## 2024-03-16 MED ORDER — DEXAMETHASONE SOD PHOSPHATE PF 10 MG/ML IJ SOLN
INTRAMUSCULAR | Status: DC | PRN
  Administered 2024-03-16: 10 mg via INTRAVENOUS

## 2024-03-16 MED ORDER — PHENYLEPHRINE 80 MCG/ML (10ML) SYRINGE FOR IV PUSH (FOR BLOOD PRESSURE SUPPORT)
PREFILLED_SYRINGE | INTRAVENOUS | Status: DC | PRN
  Administered 2024-03-16: 80 ug via INTRAVENOUS

## 2024-03-16 MED ORDER — ONDANSETRON HCL 4 MG/2ML IJ SOLN
INTRAMUSCULAR | Status: DC | PRN
  Administered 2024-03-16: 4 mg via INTRAVENOUS

## 2024-03-16 MED ORDER — ACETAMINOPHEN 500 MG PO TABS
1000.0000 mg | ORAL_TABLET | ORAL | Status: AC
  Administered 2024-03-16: 1000 mg via ORAL

## 2024-03-16 MED ORDER — IBUPROFEN 600 MG PO TABS: 600.0000 mg | ORAL_TABLET | Freq: Four times a day (QID) | ORAL | 0 refills | Status: DC | PRN

## 2024-03-16 MED ORDER — SENNA 8.6 MG PO TABS: 2.0000 | ORAL_TABLET | Freq: Every evening | ORAL | 0 refills | Status: AC | PRN

## 2024-03-16 MED ORDER — OXYCODONE HCL 5 MG PO TABS: 5.0000 mg | ORAL_TABLET | ORAL | 0 refills | Status: AC | PRN

## 2024-03-16 MED ORDER — ROCURONIUM BROMIDE 10 MG/ML (PF) SYRINGE
PREFILLED_SYRINGE | INTRAVENOUS | Status: AC
Start: 2024-03-16 — End: 2024-03-16
  Filled 2024-03-16: qty 10

## 2024-03-16 MED ORDER — LACTATED RINGERS IV SOLN: INTRAVENOUS | Status: DC

## 2024-03-16 MED ORDER — ONDANSETRON HCL 4 MG/2ML IJ SOLN
INTRAMUSCULAR | Status: AC
  Filled 2024-03-16: qty 2

## 2024-03-16 MED ORDER — ACETAMINOPHEN 500 MG PO TABS
ORAL_TABLET | ORAL | Status: AC
  Filled 2024-03-16: qty 2

## 2024-03-16 MED ORDER — MIDAZOLAM HCL 2 MG/2ML IJ SOLN
INTRAMUSCULAR | Status: AC
  Filled 2024-03-16: qty 2

## 2024-03-16 MED ORDER — OXYCODONE HCL 5 MG/5ML PO SOLN: 5.0000 mg | Freq: Once | ORAL | Status: AC | PRN

## 2024-03-16 MED ORDER — FENTANYL CITRATE (PF) 100 MCG/2ML IJ SOLN
INTRAMUSCULAR | Status: DC | PRN
  Administered 2024-03-16 (×4): 50 ug via INTRAVENOUS

## 2024-03-16 MED ORDER — PROPOFOL 10 MG/ML IV BOLUS
INTRAVENOUS | Status: DC | PRN
Start: 2024-03-16 — End: 2024-03-16
  Administered 2024-03-16: 200 mg via INTRAVENOUS

## 2024-03-16 MED ORDER — MIDAZOLAM HCL (PF) 2 MG/2ML IJ SOLN
INTRAMUSCULAR | Status: DC | PRN
  Administered 2024-03-16: 2 mg via INTRAVENOUS

## 2024-03-16 MED ORDER — CELECOXIB 200 MG PO CAPS
400.0000 mg | ORAL_CAPSULE | ORAL | Status: AC
Start: 2024-03-16 — End: 2024-03-16
  Administered 2024-03-16: 400 mg via ORAL

## 2024-03-16 MED ORDER — ORAL CARE MOUTH RINSE: 15.0000 mL | Freq: Once | OROMUCOSAL | Status: AC

## 2024-03-16 MED ORDER — SUGAMMADEX SODIUM 200 MG/2ML IV SOLN
INTRAVENOUS | Status: DC | PRN
  Administered 2024-03-16: 200 mg via INTRAVENOUS

## 2024-03-16 MED ORDER — PROPOFOL 10 MG/ML IV BOLUS
INTRAVENOUS | Status: AC
Start: 2024-03-16 — End: 2024-03-16
  Filled 2024-03-16: qty 20

## 2024-03-16 MED ORDER — LIDOCAINE 2% (20 MG/ML) 5 ML SYRINGE
INTRAMUSCULAR | Status: DC | PRN
Start: 2024-03-16 — End: 2024-03-16
  Administered 2024-03-16: 100 mg via INTRAVENOUS

## 2024-03-16 MED ORDER — CHLORHEXIDINE GLUCONATE 0.12 % MT SOLN
15.0000 mL | Freq: Once | OROMUCOSAL | Status: AC
  Administered 2024-03-16: 15 mL via OROMUCOSAL

## 2024-03-16 MED ORDER — OXYCODONE HCL 5 MG PO TABS
5.0000 mg | ORAL_TABLET | Freq: Once | ORAL | Status: AC | PRN
  Administered 2024-03-16: 5 mg via ORAL

## 2024-03-16 MED ORDER — ACETAMINOPHEN 500 MG PO TABS: 1000.0000 mg | ORAL_TABLET | Freq: Four times a day (QID) | ORAL | 0 refills | Status: AC | PRN

## 2024-03-16 MED ORDER — ROCURONIUM BROMIDE 10 MG/ML (PF) SYRINGE
PREFILLED_SYRINGE | INTRAVENOUS | Status: DC | PRN
  Administered 2024-03-16: 60 mg via INTRAVENOUS

## 2024-03-16 MED ORDER — DROPERIDOL 2.5 MG/ML IJ SOLN: 0.6250 mg | Freq: Once | INTRAMUSCULAR | Status: DC | PRN

## 2024-03-16 MED ORDER — CHLORHEXIDINE GLUCONATE 0.12 % MT SOLN
OROMUCOSAL | Status: AC
  Filled 2024-03-16: qty 15

## 2024-03-16 MED ORDER — CELECOXIB 200 MG PO CAPS
ORAL_CAPSULE | ORAL | Status: AC
  Filled 2024-03-16: qty 2

## 2024-03-16 SURGICAL SUPPLY — 27 items
BAG COUNTER SPONGE SURGICOUNT (BAG) ×1 IMPLANT
COVER MAYO STAND STRL (DRAPES) ×1 IMPLANT
DERMABOND ADVANCED .7 DNX12 (GAUZE/BANDAGES/DRESSINGS) ×1 IMPLANT
DRAPE SURG IRRIG POUCH 19X23 (DRAPES) ×1 IMPLANT
DURAPREP 26ML APPLICATOR (WOUND CARE) ×1 IMPLANT
GLOVE BIO SURGEON STRL SZ7 (GLOVE) ×1 IMPLANT
GLOVE BIOGEL PI IND STRL 7.0 (GLOVE) ×4 IMPLANT
GOWN STRL REUS W/ TWL XL LVL3 (GOWN DISPOSABLE) ×1 IMPLANT
IRRIGATION SUCT STRKRFLW 2 WTP (MISCELLANEOUS) IMPLANT
KIT PINK PAD W/HEAD ARM REST (MISCELLANEOUS) ×1 IMPLANT
KIT TURNOVER KIT B (KITS) ×1 IMPLANT
LIGASURE VESSEL 5MM BLUNT TIP (ELECTROSURGICAL) IMPLANT
MANIFOLD NEPTUNE II (INSTRUMENTS) IMPLANT
PACK LAPAROSCOPY BASIN (CUSTOM PROCEDURE TRAY) ×1 IMPLANT
PACK LAVH (CUSTOM PROCEDURE TRAY) IMPLANT
SEALER ENSEAL CRVD JAW TIP 37 (MISCELLANEOUS) ×1 IMPLANT
SET TUBE SMOKE EVAC HIGH FLOW (TUBING) ×1 IMPLANT
SLEEVE ADV FIXATION 5X100MM (TROCAR) ×1 IMPLANT
SUT VIC AB 4-0 PS2 18 (SUTURE) ×1 IMPLANT
SUT VICRYL 0 UR6 27IN ABS (SUTURE) ×1 IMPLANT
SYSTEM BAG RETRIEVAL 10MM (BASKET) IMPLANT
TOWEL GREEN STERILE FF (TOWEL DISPOSABLE) ×1 IMPLANT
TRAY FOLEY W/BAG SLVR 14FR (SET/KITS/TRAYS/PACK) ×1 IMPLANT
TROCAR 11X100 Z THREAD (TROCAR) ×1 IMPLANT
TROCAR ADV FIXATION 5X100MM (TROCAR) ×1 IMPLANT
TROCAR BALLN 12MMX100 BLUNT (TROCAR) ×1 IMPLANT
WARMER LAPAROSCOPE (MISCELLANEOUS) ×1 IMPLANT

## 2024-03-16 NOTE — Anesthesia Procedure Notes (Signed)
 Procedure Name: Intubation Date/Time: 03/16/2024 2:14 PM  Performed by: Viviana Almarie DASEN, CRNAPre-anesthesia Checklist: Patient identified, Emergency Drugs available, Suction available and Patient being monitored Patient Re-evaluated:Patient Re-evaluated prior to induction Oxygen Delivery Method: Circle System Utilized Preoxygenation: Pre-oxygenation with 100% oxygen Induction Type: IV induction Ventilation: Mask ventilation without difficulty Laryngoscope Size: Mac and 3 Grade View: Grade I Tube type: Oral Number of attempts: 1 Airway Equipment and Method: Stylet and Oral airway Placement Confirmation: ETT inserted through vocal cords under direct vision, positive ETCO2 and breath sounds checked- equal and bilateral Secured at: 21 cm Tube secured with: Tape Dental Injury: Teeth and Oropharynx as per pre-operative assessment

## 2024-03-16 NOTE — Anesthesia Preprocedure Evaluation (Signed)
 Anesthesia Evaluation  Patient identified by MRN, date of birth, ID band Patient awake    Reviewed: Allergy & Precautions, H&P , NPO status , Patient's Chart, lab work & pertinent test results  History of Anesthesia Complications Negative for: history of anesthetic complications  Airway Mallampati: II  TM Distance: >3 FB Neck ROM: Full    Dental no notable dental hx.    Pulmonary neg pulmonary ROS, neg sleep apnea   Pulmonary exam normal breath sounds clear to auscultation       Cardiovascular (-) angina negative cardio ROS Normal cardiovascular exam Rhythm:Regular Rate:Normal     Neuro/Psych neg Seizures negative neurological ROS  negative psych ROS   GI/Hepatic negative GI ROS, Neg liver ROS,neg GERD  ,,  Endo/Other  negative endocrine ROS    Renal/GU negative Renal ROS  negative genitourinary   Musculoskeletal negative musculoskeletal ROS (+)    Abdominal   Peds negative pediatric ROS (+)  Hematology negative hematology ROS (+)   Anesthesia Other Findings   Reproductive/Obstetrics negative OB ROS                              Anesthesia Physical Anesthesia Plan  ASA: 1  Anesthesia Plan: General   Post-op Pain Management: Tylenol  PO (pre-op)*   Induction: Intravenous  PONV Risk Score and Plan: 3 and Ondansetron , Dexamethasone  and Midazolam   Airway Management Planned: Oral ETT  Additional Equipment: None  Intra-op Plan:   Post-operative Plan: Extubation in OR  Informed Consent: I have reviewed the patients History and Physical, chart, labs and discussed the procedure including the risks, benefits and alternatives for the proposed anesthesia with the patient or authorized representative who has indicated his/her understanding and acceptance.     Dental advisory given  Plan Discussed with: CRNA  Anesthesia Plan Comments:          Anesthesia Quick  Evaluation

## 2024-03-16 NOTE — H&P (Signed)
 PRE OPERATIVE HISTORY AND PHYSICAL  Subjective:  AVALEY COOP is a 33 y.o. [id number] with LMP 03/01/24 presenting for scheduled laparoscopic bilateral salpingectomy for permanent sterilization  Patient desires permanent sterilization. No complaints today. Feels sure of her decision.   UPT negative today  Past Medical History:  Diagnosis Date   History of chlamydia    History of gonorrhea    History of panic attacks    Past Surgical History:  Procedure Laterality Date   CHOLECYSTECTOMY N/A 11/24/2012   Procedure: LAPAROSCOPIC CHOLECYSTECTOMY WITH INTRAOPERATIVE CHOLANGIOGRAM;  Surgeon: Morene ONEIDA Olives, MD;  Location: [facility] ORS;  Service: General;  Laterality: N/A;   DILATION AND EVACUATION N/A 09/06/2021   Procedure: DILATATION AND EVACUATION;  Surgeon: Zina Jerilynn LABOR, MD;  Location: [facility] OR;  Service: Gynecology;  Laterality: N/A;   No current facility-administered medications on file prior to encounter.   Current Outpatient Medications on File Prior to Encounter  Medication Sig Dispense Refill   ibuprofen  (ADVIL ) 600 MG tablet Take 1 tablet (600 mg total) by mouth every 6 (six) hours. 30 tablet 0   Prenatal Vit-Fe Phos-FA-Omega (VITAFOL  GUMMIES) 3.33-0.333-34.8 MG CHEW Chew 3 tablets by mouth daily. 90 tablet 11   No Known Allergies OB History     Gravida  7   Para  4   Term  4   Preterm  0   AB  3   Living  4      SAB  1   IAB  2   Ectopic  0   Multiple  0   Live Births  4        Obstetric Comments  2020 procedure        Social History   Socioeconomic History   Marital status: Single    Spouse name: Not on file   Number of children: Not on file   Years of education: Not on file   Highest education level: Not on file  Occupational History   Not on file  Tobacco Use   Smoking status: Never   Smokeless tobacco: Never  Vaping Use   Vaping status: Never Used  Substance and Sexual Activity   Alcohol use: Not Currently    Comment:  rarely   Drug use: Never   Sexual activity: Not on file  Other Topics Concern   Not on file  Social History Narrative   Not on file   Social Drivers of Health   Financial Resource Strain: Low Risk  (02/12/2024)   Overall Financial Resource Strain (CARDIA)    Difficulty of Paying Living Expenses: Not very hard  Food Insecurity: No Food Insecurity (02/12/2024)   Hunger Vital Sign    Worried About Running Out of Food in the Last Year: Never true    Ran Out of Food in the Last Year: Never true  Transportation Needs: No Transportation Needs (02/12/2024)   PRAPARE - Administrator, Civil Service (Medical): No    Lack of Transportation (Non-Medical): No  Physical Activity: Not on file  Stress: Not on file  Social Connections: Not on file  Intimate Partner Violence: Not At Risk (10/22/2023)   Humiliation, Afraid, Rape, and Kick questionnaire    Fear of Current or Ex-Partner: No    Emotionally Abused: No    Physically Abused: No    Sexually Abused: No   Objective:   Vitals:   03/10/24 1214 03/16/24 1221  BP:  (!) 144/90  Pulse:  80  Resp:  17  Temp:  98 F (36.7 C)  TempSrc:  Oral  SpO2:  95%  Weight: 101.6 kg 101.6 kg  Height: 5' 3 (1.6 m) 5' 3 (1.6 m)   General:  Alert, oriented and cooperative. Patient is in no acute distress.  Skin: Skin is warm and dry. No rash noted.   Cardiovascular: Normal heart rate noted  Respiratory: Normal respiratory effort, no problems with respiration noted  Abdomen: Soft, non-tender, non-distended    Assessment and Plan:  MUNA DEMERS is a 33 y.o. here for scheduled laparoscopic bilateral salpingectomy for permanent sterilization  - She desires permanent sterilization. Discussed alternatives including LARC options and vasectomy. She declines these options.  - Discussed surgery of salpingectomy for lower rate on contraceptive failure and potential for ovarian cancer risk reduction. She is in agreement with the plan - Risks of  surgery include but are not limited to: bleeding, infection, injury to surrounding organs/tissues (i.e. bowel/bladder/ureters), need for additional procedures, wound complications, hospital re-admission, and conversion to open surgery, VTE. We reviewed risk of contraceptive failure and risk of regret.  - Reviewed restrictions and recovery following surgery - No pre op abx indicated - Consent signed. To OR when ready  Kieth JAYSON Carolin, MD

## 2024-03-16 NOTE — Transfer of Care (Signed)
 Immediate Anesthesia Transfer of Care Note  Patient: Annette Wood  Procedure(s) Performed: SALPINGECTOMY, BILATERAL, LAPAROSCOPIC (Bilateral: Abdomen)  Patient Location: PACU  Anesthesia Type:General  Level of Consciousness: sedated  Airway & Oxygen Therapy: Patient connected to nasal cannula oxygen  Post-op Assessment: Report given to RN and Post -op Vital signs reviewed and stable  Post vital signs: Reviewed and stable  Last Vitals:  Vitals Value Taken Time  BP 140/81 03/16/24 15:21  Temp    Pulse 81 03/16/24 15:22  Resp 23 03/16/24 15:22  SpO2 100 % 03/16/24 15:22  Vitals shown include unfiled device data.  Last Pain:  Vitals:   03/16/24 1221  TempSrc: Oral  PainSc: 0-No pain      Patients Stated Pain Goal: 6 (03/16/24 1221)  Complications: No notable events documented.

## 2024-03-16 NOTE — Op Note (Signed)
 Terrie CHRISTELLA Moats PROCEDURE DATE: 03/16/2024  PREOPERATIVE DIAGNOSIS:  Undesired fertility  POSTOPERATIVE DIAGNOSIS:  Undesired fertility  PROCEDURE:  Laparoscopic Bilateral Salpingectomy   SURGEON:  Dr. Kieth Carolin  ASSISTANT:  Dr. Izetta Bring. An experienced assistant was required given the standard of surgical care given the complexity of the case.  This assistant was needed for exposure, dissection, suctioning, retraction, instrument exchange, and for overall help during the procedure.  ANESTHESIA:  General endotracheal  COMPLICATIONS:  None immediate.  ESTIMATED BLOOD LOSS:  5 ml.  FLUIDS: 800 ml LR.  URINE OUTPUT:  200 ml of clear urine.  INDICATIONS: 33 y.o. [id number] with undesired fertility, desires permanent sterilization.  Other forms of contraception were discussed with patient and emphasized alternatives of vasectomy, IUDs and Nexplanon  as they have equivalent contraceptive efficacy; she declines all other modalities.  Risks of procedure discussed with patient including permanence of method, risk of regret, bleeding, infection, injury to surrounding organs and need for additional procedures including laparotomy.  Failure risk less than 0.5% with increased risk of ectopic gestation if pregnancy occurs was also discussed with patient.  Written informed consent was obtained.    FINDINGS:  Normal uterus, fallopian tubes, and ovaries. Normal peritoneal surfaces.  TECHNIQUE:  After all consents were signed and questions were answered the patient was taken to the operating room where general anesthesia was induced. She was placed in dorsal lithotomy position. A catheter was used to drain her bladder. She was prepped and draped in the usual sterile fashion in the dorsal supine position. A timeout was performed.  A speculum was then placed in the patient's vagina, and the anterior lip of cervix grasped with the single-tooth tenaculum.  The uterine manipulator was then advanced into  the uterus.  The speculum was removed from the vagina.  A 1cm incision was made within the umbilicus. The fascia was grasped, elevated and sharply incised. The peritoneum was entered bluntly. After confirmation of intraperitoneal entry, the abdomen was insufflated. The area under the point of entry was carefully inspected and no injury was noted. The patient was then placed in Trendelenburg position.  The scalpel was then used to make two 5mm incsions, one in the LLQ and one in the RLQ. 5mm optiview ports were inserted. The fallopian tubes were cauterized, cut, and detached from their surrounding pelvic structures with the Ligasure. No bleeding was noted. The specimens were withdrawn through the 5mm ports. The pelvis was inspected and was hemostatic. All ports were then withdrawn and the gas drained from abdomen. They were then closed in a subcuticular fashion with 4-0 monocryl and dressings were placed.  The patient will be discharged to home as per PACU criteria.  Routine postoperative instructions given.  She will follow up in the office in 4 wks for postoperative evaluation.  Kieth Carolin, MD Obstetrician & Gynecologist, [facility] for [employer], [facility]

## 2024-03-16 NOTE — Anesthesia Postprocedure Evaluation (Signed)
 Anesthesia Post Note  Patient: Annette Wood  Procedure(s) Performed: SALPINGECTOMY, BILATERAL, LAPAROSCOPIC (Bilateral: Abdomen)     Patient location during evaluation: PACU Anesthesia Type: General Level of consciousness: awake and alert Pain management: pain level controlled Vital Signs Assessment: post-procedure vital signs reviewed and stable Respiratory status: spontaneous breathing, nonlabored ventilation, respiratory function stable and patient connected to nasal cannula oxygen Cardiovascular status: blood pressure returned to baseline and stable Postop Assessment: no apparent nausea or vomiting Anesthetic complications: no   No notable events documented.  Last Vitals:  Vitals:   03/16/24 1600 03/16/24 1615  BP: (!) 132/93 133/86  Pulse: 87 67  Resp: 20 17  Temp: 36.6 C   SpO2: 99% 99%    Last Pain:  Vitals:   03/16/24 1602  TempSrc:   PainSc: 6                  Thom JONELLE Peoples

## 2024-03-16 NOTE — Discharge Instructions (Addendum)
 Post-surgical Instructions, Outpatient Surgery  You may expect to feel dizzy, weak, and drowsy for as long as 24 hours after receiving the medicine that made you sleep (anesthetic). For the first 24 hours after your surgery:   Do not drive a car, ride a bicycle, participate in physical activities, or take public transportation until you are done taking narcotic pain medicines Do not drink alcohol or take tranquilizers.  Do not take medicine that has not been prescribed by your physicians.  Do not sign important papers or make important decisions while on narcotic pain medicines.  Have a responsible person with you.   CARE OF INCISION If you have a bandage, you may remove it in one day.  If there are steri-strips or dermabond, just let this loosen on its own.  You may shower on the first day after your surgery.  Do not sit in a tub bath for one week. Avoid heavy lifting (more than 10 pounds/4.5 kilograms), pushing, or pulling.  Avoid activities that may risk injury to your incisions.   PAIN MANAGEMENT Ibuprofen  600mg .  (This is the same as 3-200mg  over the counter tablets of Motrin  or ibuprofen .)  Take this every 6 hours or as needed for cramping.   Acetaminophen  1000mg  (This is the same as 2-500mg  over the counter extra strength tylenol ). Take this every 6 hours for the first 3 days or as needed afterwards for pain Oxycodone  5mg  For more severe pain, take one or two tablets every four to six hours as needed for pain control.  (Remember that narcotic pain medications increase your risk of constipation.  If this becomes a problem, you may take an over the counter laxative like miralax.)  DO'S AND DON'T'S Do not take a tub bath for 4 weeks.  You may shower on the first day after your surgery Do not do any heavy lifting for one to two weeks.  This increases the chance of bleeding. Do move around as you feel able.  Stairs are fine.  You may begin to exercise again as you feel able.  Do not lift any  weights for four weeks. Do not put anything in the vagina for one week--no tampons, intercourse, or douching.    REGULAR MEDIATIONS/VITAMINS: You may restart all of your regular medications as prescribed. You may restart all of your vitamins as you normally take them.    PLEASE CALL OR SEEK MEDICAL CARE IF: You have persistent nausea and vomiting.  You have trouble eating or drinking.  You have an oral temperature above 100.5.  You have constipation that is not helped by adjusting diet or increasing fluid intake. Pain medicines are a common cause of constipation.  You have heavy vaginal bleeding You have redness or drainage from your incision(s) or there is increasing pain or tenderness near or in the surgical site.

## 2024-03-17 ENCOUNTER — Encounter (HOSPITAL_COMMUNITY): Payer: Self-pay | Admitting: Obstetrics and Gynecology

## 2024-03-19 ENCOUNTER — Ambulatory Visit: Payer: Self-pay | Admitting: Obstetrics and Gynecology

## 2024-03-19 LAB — SURGICAL PATHOLOGY

## 2024-03-26 ENCOUNTER — Other Ambulatory Visit: Payer: Self-pay | Admitting: Obstetrics and Gynecology

## 2024-03-26 ENCOUNTER — Other Ambulatory Visit: Payer: Self-pay

## 2024-04-19 ENCOUNTER — Encounter: Admitting: Obstetrics and Gynecology

## 2024-05-07 ENCOUNTER — Encounter: Payer: Self-pay | Admitting: Medical

## 2024-07-12 ENCOUNTER — Ambulatory Visit: Payer: Self-pay | Admitting: Obstetrics
